# Patient Record
Sex: Male | Born: 1945 | Race: White | Hispanic: No | Marital: Married | Smoking: Never smoker
Health system: Southern US, Community
[De-identification: ages and names within clinical notes are randomized; demographics above are authoritative.]

## PROBLEM LIST (undated history)

## (undated) DIAGNOSIS — M4802 Spinal stenosis, cervical region: Secondary | ICD-10-CM

## (undated) DIAGNOSIS — N401 Enlarged prostate with lower urinary tract symptoms: Secondary | ICD-10-CM

## (undated) DIAGNOSIS — M25811 Other specified joint disorders, right shoulder: Secondary | ICD-10-CM

## (undated) DIAGNOSIS — E785 Hyperlipidemia, unspecified: Secondary | ICD-10-CM

## (undated) DIAGNOSIS — E291 Testicular hypofunction: Secondary | ICD-10-CM

## (undated) DIAGNOSIS — M797 Fibromyalgia: Secondary | ICD-10-CM

## (undated) DIAGNOSIS — M1990 Unspecified osteoarthritis, unspecified site: Secondary | ICD-10-CM

## (undated) DIAGNOSIS — Z87898 Personal history of other specified conditions: Secondary | ICD-10-CM

## (undated) DIAGNOSIS — N320 Bladder-neck obstruction: Secondary | ICD-10-CM

## (undated) DIAGNOSIS — F419 Anxiety disorder, unspecified: Secondary | ICD-10-CM

## (undated) DIAGNOSIS — C4490 Unspecified malignant neoplasm of skin, unspecified: Secondary | ICD-10-CM

## (undated) DIAGNOSIS — F4310 Post-traumatic stress disorder, unspecified: Secondary | ICD-10-CM

## (undated) DIAGNOSIS — N529 Male erectile dysfunction, unspecified: Secondary | ICD-10-CM

## (undated) HISTORY — PX: TONSILLECTOMY: SUR1361

## (undated) HISTORY — DX: Unspecified osteoarthritis, unspecified site: M19.90

## (undated) HISTORY — DX: Hyperlipidemia, unspecified: E78.5

## (undated) HISTORY — DX: Fibromyalgia: M79.7

## (undated) HISTORY — PX: RHINOPLASTY: SUR1284

## (undated) HISTORY — DX: Unspecified malignant neoplasm of skin, unspecified: C44.90

---

## 1997-07-01 ENCOUNTER — Ambulatory Visit (HOSPITAL_COMMUNITY): Admission: RE | Admit: 1997-07-01 | Discharge: 1997-07-01 | Payer: Self-pay | Admitting: Family Medicine

## 2004-03-04 ENCOUNTER — Encounter: Admission: RE | Admit: 2004-03-04 | Discharge: 2004-03-04 | Payer: Self-pay | Admitting: Family Medicine

## 2004-06-30 ENCOUNTER — Ambulatory Visit: Payer: Self-pay | Admitting: Gastroenterology

## 2004-07-05 ENCOUNTER — Ambulatory Visit: Payer: Self-pay | Admitting: Gastroenterology

## 2004-07-06 ENCOUNTER — Ambulatory Visit: Payer: Self-pay | Admitting: Gastroenterology

## 2004-07-08 ENCOUNTER — Ambulatory Visit: Payer: Self-pay | Admitting: Gastroenterology

## 2004-07-08 ENCOUNTER — Encounter (INDEPENDENT_AMBULATORY_CARE_PROVIDER_SITE_OTHER): Payer: Self-pay | Admitting: Specialist

## 2005-06-02 ENCOUNTER — Ambulatory Visit: Payer: Self-pay | Admitting: Cardiology

## 2005-07-11 ENCOUNTER — Ambulatory Visit: Payer: Self-pay | Admitting: Gastroenterology

## 2005-07-28 ENCOUNTER — Ambulatory Visit: Payer: Self-pay | Admitting: Gastroenterology

## 2005-08-30 ENCOUNTER — Encounter (INDEPENDENT_AMBULATORY_CARE_PROVIDER_SITE_OTHER): Payer: Self-pay | Admitting: *Deleted

## 2005-08-30 ENCOUNTER — Ambulatory Visit: Payer: Self-pay | Admitting: Gastroenterology

## 2008-07-30 DIAGNOSIS — F4310 Post-traumatic stress disorder, unspecified: Secondary | ICD-10-CM | POA: Insufficient documentation

## 2008-10-05 ENCOUNTER — Encounter: Admission: RE | Admit: 2008-10-05 | Discharge: 2008-10-05 | Payer: Self-pay | Admitting: Family Medicine

## 2008-10-14 ENCOUNTER — Telehealth: Payer: Self-pay | Admitting: Family Medicine

## 2008-11-09 ENCOUNTER — Ambulatory Visit: Payer: Self-pay | Admitting: Family Medicine

## 2008-11-09 LAB — CONVERTED CEMR LAB
Ketones, urine, test strip: NEGATIVE
Nitrite: NEGATIVE
Urobilinogen, UA: 0.2
WBC Urine, dipstick: NEGATIVE
pH: 5.5

## 2009-04-20 ENCOUNTER — Encounter (INDEPENDENT_AMBULATORY_CARE_PROVIDER_SITE_OTHER): Payer: Self-pay | Admitting: *Deleted

## 2009-10-18 ENCOUNTER — Ambulatory Visit: Payer: Self-pay | Admitting: Family Medicine

## 2009-10-18 LAB — CONVERTED CEMR LAB
Bilirubin Urine: NEGATIVE
Blood in Urine, dipstick: NEGATIVE
Nitrite: NEGATIVE
Specific Gravity, Urine: 1.025
WBC Urine, dipstick: NEGATIVE

## 2009-10-19 LAB — CONVERTED CEMR LAB
Basophils Absolute: 0 10*3/uL (ref 0.0–0.1)
Bilirubin, Direct: 0.1 mg/dL (ref 0.0–0.3)
Cholesterol: 140 mg/dL (ref 0–200)
Direct LDL: 79.6 mg/dL
Eosinophils Absolute: 0.2 10*3/uL (ref 0.0–0.7)
HCT: 47.2 % (ref 39.0–52.0)
HDL: 31.1 mg/dL — ABNORMAL LOW (ref >39.00)
Lymphs Abs: 1.3 10*3/uL (ref 0.7–4.0)
MCV: 87.7 fL (ref 78.0–100.0)
Monocytes Absolute: 0.6 10*3/uL (ref 0.1–1.0)
Neutrophils Relative %: 74.1 % (ref 43.0–77.0)
Platelets: 175 10*3/uL (ref 150.0–400.0)
RDW: 14.6 % (ref 11.5–14.6)
Total Bilirubin: 0.9 mg/dL (ref 0.3–1.2)
Total CHOL/HDL Ratio: 5
VLDL: 58.8 mg/dL — ABNORMAL HIGH (ref 0.0–40.0)

## 2009-12-30 ENCOUNTER — Encounter (INDEPENDENT_AMBULATORY_CARE_PROVIDER_SITE_OTHER): Payer: Self-pay | Admitting: *Deleted

## 2010-01-11 ENCOUNTER — Encounter (INDEPENDENT_AMBULATORY_CARE_PROVIDER_SITE_OTHER): Payer: Self-pay | Admitting: *Deleted

## 2010-01-12 ENCOUNTER — Ambulatory Visit: Payer: Self-pay | Admitting: Gastroenterology

## 2010-01-17 ENCOUNTER — Telehealth: Payer: Self-pay | Admitting: Gastroenterology

## 2010-02-08 ENCOUNTER — Telehealth: Payer: Self-pay | Admitting: Gastroenterology

## 2010-02-11 ENCOUNTER — Ambulatory Visit: Payer: Self-pay | Admitting: Gastroenterology

## 2010-03-08 ENCOUNTER — Telehealth: Payer: Self-pay | Admitting: Gastroenterology

## 2010-03-09 ENCOUNTER — Ambulatory Visit
Admission: RE | Admit: 2010-03-09 | Discharge: 2010-03-09 | Payer: Self-pay | Source: Home / Self Care | Attending: Gastroenterology | Admitting: Gastroenterology

## 2010-03-09 DIAGNOSIS — K5730 Diverticulosis of large intestine without perforation or abscess without bleeding: Secondary | ICD-10-CM | POA: Insufficient documentation

## 2010-03-12 ENCOUNTER — Encounter: Payer: Self-pay | Admitting: Family Medicine

## 2010-03-14 ENCOUNTER — Encounter: Payer: Self-pay | Admitting: Family Medicine

## 2010-03-22 NOTE — Assessment & Plan Note (Signed)
Summary: NEW PT TO EST/CLE   Vital Signs:  Patient profile:   65 year old male Height:      69 inches Weight:      195.6 pounds BMI:     28.99 Temp:     98.2 degrees F oral Pulse rate:   76 / minute Pulse rhythm:   regular BP sitting:   130 / 90  (left arm) Cuff size:   regular  Vitals Entered By: Benny Lennert CMA Duncan Dull) (October 18, 2009 1:49 PM)  History of Present Illness: 65 yo here to establish care. Pt of the [facility] clinic.  1. Low back pain- started about a month ago.  Midback, radiates to his thighs bilaterally, right> left.  Never radiates past his knees.  No LE weakness.  Does have increased urinary urgency which has been ongoing for sometime.  No nausea, vomiting, fevers or chills.  Helps take care of his grandchildren.  2.  Right scrotal masses- has h/o scrotal varices but noticed a new firm mass on top of his right testicle a few months ago.  Not getting bigger.  No redness.  It is a little sensitive but not painful.  3.  Fibromyalgia- on Paxil 20 mg dail for years.  Tried to get off of it but it "made him crazy."  Pain moves from his hips to shoulders, has been ok lately.  4.  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  Has not had repeat labs.  Libido is a little improved, less fatigue.  Current Medications (verified): 1)  Zocor 40 Mg Tabs (Simvastatin) .... Take 1 Tab By Mouth At Bedtime 2)  Paxil 20 Mg Tabs (Paroxetine Hcl) .... Take 1 Tab By Mouth Once Daily 3)  Testim 1 % Gel (Testosterone) .... 2.5% Daily 4)  Centrum Silver  Chew (Multiple Vitamins-Minerals) .... One Daily  Allergies (verified): No Known Drug Allergies  Past History:  Past Medical History: Last updated: 11/09/2008 fibromyalgia skin cancer hx white coat HTN  adenosine myoview, 2008 normal  Past Surgical History: Last updated: 11/09/2008 skin cancer removal, Dr Tanya Nones  Family History: Last updated: 11/09/2008 identical twin brother AMI at 72, 23,  high chol father died at 22, AMI Mother died at 75, AMI brother/ sister healthy  Social History: Last updated: 11/09/2008 Retired from [employer].  Retired from [employer], 4 yrs. Has BA degree. Married to [place].  No kids, stepson. Never smoked. No ETOH. Walks 45 min 5 days/ wk.    Review of Systems      See HPI General:  Denies fever. Eyes:  Denies blurring. ENT:  Denies difficulty swallowing. CV:  Denies chest pain or discomfort. Resp:  Denies shortness of breath. GI:  Denies abdominal pain, bloody stools, and change in bowel habits. GU:  Complains of decreased libido and urinary frequency; denies discharge, dysuria, erectile dysfunction, genital sores, hematuria, incontinence, nocturia, and urinary hesitancy. MS:  Complains of low back pain and stiffness; denies loss of strength and muscle weakness. Derm:  Denies rash. Neuro:  Denies headaches. Psych:  Denies anxiety and depression. Endo:  Denies cold intolerance and heat intolerance. Heme:  Denies abnormal bruising and bleeding.  Physical Exam  General:  alert, well-developed, well-nourished, and well-hydrated.   Head:  normocephalic, atraumatic, no abnormalities observed, and no abnormalities palpated.   Eyes:  vision grossly intact, pupils equal, pupils round, and pupils reactive to light.   Ears:  R ear normal and L ear normal.   Nose:  no external deformity.   Mouth:  good dentition.   Lungs:  Normal respiratory effort, chest expands symmetrically. Lungs are clear to auscultation, no crackles or wheezes. Heart:  Normal rate and regular rhythm. S1 and S2 normal without gallop, murmur, click, rub or other extra sounds. Abdomen:  Bowel sounds positive,abdomen soft and non-tender without masses, organomegaly or hernias noted. Genitalia:  bilateral scrotal varices, palpable mass above right testicle, nontender to palp.circumcised and no urethral discharge.   Msk:  No deformity or scoliosis noted of thoracic or lumbar spine.     SLR pos right, neg fabers bilaterally Extremities:  No clubbing, cyanosis, edema, or deformity noted with normal full range of motion of all joints.   Neurologic:  alert & oriented X3 and gait normal.   Skin:  Intact without suspicious lesions or rashes Psych:  Cognition and judgment appear intact. Alert and cooperative with normal attention span and concentration. No apparent delusions, illusions, hallucinations   Impression & Recommendations:  Problem # 1:  TESTICULAR HYPOFUNCTION (ICD-257.2) Assessment Unchanged Has not had labs redrawn since he started Testim. Will recheck testosterone, CBC, hepatic panel today. Orders: TLB-Testosterone, Total (84403-TESTO) TLB-CBC Platelet - w/Differential (85025-CBCD) TLB-Hepatic/Liver Function Pnl (80076-HEPATIC)  Problem # 2:  TESTICULAR MASS, RIGHT (ZOX-096.04) Assessment: New Scrotal ultrasound- [facility].  Problem # 3:  FIBROMYALGIA (ICD-729.1) Assessment: Unchanged Stable on Paxil 20 mg daily.  Problem # 4:  BACK PAIN, ACUTE (ICD-724.5) Assessment: New Likely lumbar strain but given other symptoms, will go ahead and check spine films. Pt declining muscle relaxants at this time, which is appropriate. UA neg. Orders: UA Dipstick w/o Micro (manual) (54098) T-Lumbar Spine Complete, 5 Views (71110TC)  Complete Medication List: 1)  Zocor 40 Mg Tabs (Simvastatin) .... Take 1 tab by mouth at bedtime 2)  Paxil 20 Mg Tabs (Paroxetine hcl) .... Take 1 tab by mouth once daily 3)  Testim 1 % Gel (Testosterone) .... 2.5% daily 4)  Centrum Silver Chew (Multiple vitamins-minerals) .... One daily  Other Orders: TLB-Lipid Panel (80061-LIPID) Venipuncture (11914) TLB-PSA (Prostate Specific Antigen) (84153-PSA)  Current Allergies (reviewed today): No known allergies   CC: Establish care   Prevention & Chronic Care Immunizations   Influenza vaccine: Not documented    Tetanus booster:  09/29/2003: historical   Td booster deferral: Not indicated  (10/18/2009)   Tetanus booster due: 09/28/2013    Pneumococcal vaccine: Not documented    H. zoster vaccine: Not documented  Colorectal Screening   Hemoccult: Not documented    Colonoscopy: historical  (10/13/2003)   Colonoscopy due: Refused  (10/18/2009)  Other Screening   PSA: Not documented   PSA ordered.   PSA action/deferral: Discussed-PSA requested  (10/18/2009)   Smoking status: Not documented  Lipids   Total Cholesterol: Not documented   Lipid panel action/deferral: Lipid Panel ordered   LDL: Not documented   LDL Direct: Not documented   HDL: Not documented   Triglycerides: Not documented   Nursing Instructions: Give Herpes zoster vaccine today    TD Result Date:  09/29/2003 TD Result:  historical Colonoscopy Result Date:  10/13/2003 Colonoscopy Result:  historical Colonoscopy Next Due:  Refused  Laboratory Results   Urine Tests  Date/Time Received: October 18, 2009 2:26 PM  Date/Time Reported: October 18, 2009 2:26 PM   Routine Urinalysis   Color: yellow Appearance: Clear Glucose: negative   (Normal Range: Negative) Bilirubin: negative   (Normal Range: Negative) Ketone: negative   (  Normal Range: Negative) Spec. Gravity: 1.025   (Normal Range: 1.003-1.035) Blood: negative   (Normal Range: Negative) pH: 6.0   (Normal Range: 5.0-8.0) Protein: trace   (Normal Range: Negative) Urobilinogen: 0.2   (Normal Range: 0-1) Nitrite: negative   (Normal Range: Negative) Leukocyte Esterace: negative   (Normal Range: Negative)         Appended Document: NEW PT TO EST/CLE   Zostavax # 1    Vaccine Type: Zostavax    Site: left deltoid    Mfr: Merck    Dose: 0.5 ml    Route: McGraw    Given by: Sydell Axon LPN    Exp. Date: 09/15/2010    Lot #: [id number]    VIS given: 12/02/04 given October 18, 2009.

## 2010-03-22 NOTE — Letter (Signed)
Summary: Pre Visit Letter Revised  [facility]  [address] [place], [place] [address]   Phone: [phone]  Fax: [phone]      12/30/2009 MRN: [id number]     [facility] [place] [address] [place], [place]  [address]             Procedure Date:  01/25/10 @ 4 P.M.   Welcome to the Gastroenterology Division at [facility].    You are scheduled to see a nurse for your pre-procedure visit on [facility], 01/12/10 at 11:00 A.M. on the 3rd floor at [facility], [address]  We ask that you try to arrive at our office 15 minutes prior to your appointment time to allow for check-in.  Please take a minute to review the attached form.  If you answer "Yes" to one or more of the questions on the first page, we ask that you call the person listed at your earliest opportunity.  If you answer "No" to all of the questions, please complete the rest of the form and bring it to your appointment.    Your nurse visit will consist of discussing your medical and surgical history, your immediate family medical history, and your medications.   If you are unable to list all of your medications on the form, please bring the medication bottles to your appointment and we will list them.  We will need to be aware of both prescribed and over the counter drugs.  We will need to know exact dosage information as well.    Please be prepared to read and sign documents such as consent forms, a financial agreement, and acknowledgement forms.  If necessary, and with your consent, a friend or relative is welcome to sit-in on the nurse visit with you.  Please bring your insurance card so that we may make a copy of it.  If your insurance requires a referral to see a specialist, please bring your referral form from your primary care physician.  No co-pay is required for this nurse visit.     If you cannot keep your appointment, please call [phone] to cancel or reschedule prior to your  appointment date.  This allows [place] the opportunity to schedule an appointment for another patient in need of care.    Thank you for choosing [facility] for your medical needs.  We appreciate the opportunity to care for you.  Please visit [place] at our website  to learn more about our practice.  Sincerely, The Gastroenterology Division

## 2010-03-22 NOTE — Letter (Signed)
Summary: [facility] Instructions  [facility]  [address] [place], [place] [address]   Phone: [phone]  Fax: [phone]       Eric Rice    09-05-1945    MRN: [id number]        Procedure Day /Date:  Tuesday 01/25/2010     Arrival Time: 3:00 pm      Procedure Time: 4:00 pm     Location of Procedure:                    _x _  [facility] (4th Floor)                        PREPARATION FOR COLONOSCOPY WITH MOVIPREP   Starting 5 days prior to your procedure Thursday 12/1 do not eat nuts, seeds, popcorn, corn, beans, peas,  salads, or any raw vegetables.  Do not take any fiber supplements (e.g. Metamucil, Citrucel, and Benefiber).  THE DAY BEFORE YOUR PROCEDURE         DATE: Monday 12/5  1.  Drink clear liquids the entire day-NO SOLID FOOD  2.  Do not drink anything colored red or purple.  Avoid juices with pulp.  No orange juice.  3.  Drink at least 64 oz. (8 glasses) of fluid/clear liquids during the day to prevent dehydration and help the prep work efficiently.  CLEAR LIQUIDS INCLUDE: Water Jello Ice Popsicles Tea (sugar ok, no milk/cream) Powdered fruit flavored drinks Coffee (sugar ok, no milk/cream) Gatorade Juice: apple, white grape, white cranberry  Lemonade Clear bullion, consomm, broth Carbonated beverages (any kind) Strained chicken noodle soup Hard Candy                             4.  In the morning, mix first dose of MoviPrep solution:    Empty 1 Pouch A and 1 Pouch B into the disposable container    Add lukewarm drinking water to the top line of the container. Mix to dissolve    Refrigerate (mixed solution should be used within 24 hrs)  5.  Begin drinking the prep at 5:00 p.m. The MoviPrep container is divided by 4 marks.   Every 15 minutes drink the solution down to the next mark (approximately 8 oz) until the full liter is complete.   6.  Follow completed prep with 16 oz of clear liquid of your choice (Nothing red  or purple).  Continue to drink clear liquids until bedtime.  7.  Before going to bed, mix second dose of MoviPrep solution:    Empty 1 Pouch A and 1 Pouch B into the disposable container    Add lukewarm drinking water to the top line of the container. Mix to dissolve    Refrigerate  THE DAY OF YOUR PROCEDURE      DATE: Tuesday 12/6  Beginning at 11:00 a.m. (5 hours before procedure):         1. Every 15 minutes, drink the solution down to the next mark (approx 8 oz) until the full liter is complete.  2. Follow completed prep with 16 oz. of clear liquid of your choice.    3. You may drink clear liquids until 2:00 pm (2 HOURS BEFORE PROCEDURE).   MEDICATION INSTRUCTIONS  Unless otherwise instructed, you should take regular prescription medications with a small sip of water   as early as possible the morning of  your procedure.         OTHER INSTRUCTIONS  You will need a responsible adult at least 65 years of age to accompany you and drive you home.   This person must remain in the waiting room during your procedure.  Wear loose fitting clothing that is easily removed.  Leave jewelry and other valuables at home.  However, you may wish to bring a book to read or  an iPod/MP3 player to listen to music as you wait for your procedure to start.  Remove all body piercing jewelry and leave at home.  Total time from sign-in until discharge is approximately 2-3 hours.  You should go home directly after your procedure and rest.  You can resume normal activities the  day after your procedure.  The day of your procedure you should not:   Drive   Make legal decisions   Operate machinery   Drink alcohol   Return to work  You will receive specific instructions about eating, activities and medications before you leave.    The above instructions have been reviewed and explained to me by   Clide Cliff, RN______________________    I fully understand and can verbalize  these instructions _____________________________ Date _________

## 2010-03-22 NOTE — Miscellaneous (Signed)
Summary: R COLON...LSW.  Clinical Lists Changes  Medications: Added new medication of MOVIPREP 100 GM  SOLR (PEG-KCL-NACL-NASULF-NA ASC-C) As per prep instructions. - Signed Rx of MOVIPREP 100 GM  SOLR (PEG-KCL-NACL-NASULF-NA ASC-C) As per prep instructions.;  #1 x 0;  Signed;  Entered by: Clide Cliff RN;  Authorized by: Meryl Dare MD [facility];  Method used: Print then Give to Patient Observations: Added new observation of ALLERGY REV: Done (01/12/2010 10:57)    Prescriptions: MOVIPREP 100 GM  SOLR (PEG-KCL-NACL-NASULF-NA ASC-C) As per prep instructions.  #1 x 0   Entered by:   Clide Cliff RN   Authorized by:   Meryl Dare MD [facility]   Signed by:   Clide Cliff RN on 01/12/2010   Method used:   Print then Give to Patient   RxID:   [id number]

## 2010-03-22 NOTE — Progress Notes (Signed)
Summary: Question   Phone Note Call from Patient Call back at Home Phone [phone]   Caller: Patient Call For: Dr. Russella Dar Reason for Call: Talk to Nurse Details for Reason: Question Summary of Call: Pt. has colon scheduled 12/6 at 4 p.m.  He also has to have an emergenc root canal 12/7, where he will also be "put under." He was concerned that he shouldn't be "put out" two days in a row. Please call and advise. Initial call taken by: Schuyler Amor,  January 17, 2010 3:36 PM  Follow-up for Phone Call        Patient  is advsied that since his colon is first he will need to speak with his dentist if this will be a problem. Follow-up by: Darcey Nora RN, CGRN,  January 17, 2010 3:56 PM

## 2010-03-24 NOTE — Assessment & Plan Note (Signed)
Summary: hemorrhoids/sheri   History of Present Illness Visit Type: Follow-up Visit Primary GI MD: Elie Goody MD [facility] Primary Provider: Abbe Amsterdam, MD  Requesting Provider: Abbe Amsterdam, MD  Chief Complaint: Hemorrhoids  History of Present Illness:   Mr. Eric Rice was year less than a month ago for his surveillance colonoscopy which he gets for history of polyps. He comes in today to discuss bowel movement issues. For the last 7-8 months patient has had a sensation of incomplete evacuation. His stools are very soft. In the morning he has a BM but within an hour or so he feels the need, and usually does, defecate again. It is also difficult to clean himself after defecating because the BMs are so soft. Excessive wiping results in excoriations. Patient has known history of hemorrhoids, they occasionally bleed    GI Review of Systems      Denies abdominal pain, acid reflux, belching, bloating, chest pain, dysphagia with liquids, dysphagia with solids, heartburn, loss of appetite, nausea, vomiting, vomiting blood, weight loss, and  weight gain.      Reports hemorrhoids.     Denies anal fissure, black tarry stools, change in bowel habit, constipation, diarrhea, diverticulosis, fecal incontinence, heme positive stool, irritable bowel syndrome, jaundice, light color stool, liver problems, rectal bleeding, and  rectal pain.   Current Medications (verified): 1)  Zocor 40 Mg Tabs (Simvastatin) .... Take 1 Tab By Mouth At Bedtime 2)  Paxil 20 Mg Tabs (Paroxetine Hcl) .... Take 1 Tab By Mouth Once Daily 3)  Testim 1 % Gel (Testosterone) .... 2.5% Daily 4)  Centrum Silver  Chew (Multiple Vitamins-Minerals) .... One Daily 5)  Preparation H Hydrocortisone 1 % Crea (Hydrocortisone) .... 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 Retired from [employer].  Retired from [employer], 4 yrs. Has BA degree. Married to [place].  No kids, stepson. Never smoked. No ETOH. Walks 45 min 5 days/ wk.    Review of Systems  The patient denies allergy/sinus, anemia, anxiety-new, arthritis/joint pain, back pain, blood in urine, breast changes/lumps, change in vision, confusion, cough, coughing up blood, depression-new, fainting, fatigue, fever, headaches-new, hearing problems, heart murmur, heart rhythm changes, itching, menstrual pain, muscle pains/cramps, night sweats, nosebleeds, pregnancy symptoms, shortness of breath, skin rash, sleeping problems, sore throat, swelling of feet/legs, swollen lymph glands, thirst - excessive , urination - excessive , urination changes/pain, urine leakage, vision changes, and voice change.    Vital Signs:  Patient profile:   65 year old male Height:      69 inches Weight:      194 pounds BMI:     28.75 BSA:     2.04 Pulse rate:   74 / minute Pulse rhythm:   regular BP sitting:   136 / 80  (left arm) Cuff size:   regular  Vitals Entered By: Ok Anis CMA (March 09, 2010 2:05 PM)  Physical Exam  General:  Well  developed, well nourished, no acute distress. Head:  Normocephalic and atraumatic. Eyes:  Conjunctiva pink, no icterus.  Abdomen:  Soft, nontender and nondistended. No masses, hepatosplenomegaly or hernias noted. Normal  bowel sounds. Rectal:  Mildly inflamed external hemorrhoids. On anoscopy there were inflamed hemorrhoids, some slightly firm and bluish but not thrombosed appearing. Neurologic:  Alert and  oriented x4;  grossly normal neurologically. Psych:  Alert and cooperative. Normal mood and affect.  Impression & Recommendations:  Problem # 1:  CHANGE IN BOWELS (HYQ-657.84) Assessment New Over last 7-8 months there has been sensation of incomplete evacuation leading to subsequent bowel movements. Stools are consistently soft and difficult to clean from anal area. He is frustrated about these problems. Also inquires if this is anything serious to worry about. Patient had a colonoscopy less than a month ago. I offered reassurance. Patient doesn't have abdominal pain but the mushy stools and sensation of incomplete evacuation can be seen in IBS. Additionally, hemorrhoids can sometimes causes sensation of incomplete evacuation. Will start by treating hemorrhoids. If no improvement we may try firming up his stools a bit. Patient will call with condition update in a week or so.  Problem # 2:  HEMORRHOIDS-INTERNAL (ICD-455.0) Assessment: Deteriorated Trial of steroid suppositories. Avoid straining. Recommended Balneol or baby wipes for cleaning.  Patient Instructions: 1)  Call us in 7 days with a progress report. 2)  You can ask for Pam at [phone].  3)  We sent a perscription for suppositories to [facility]. 4)  Copy sent to :  Jessice Copland, MD 5)  The medication list was reviewed and reconciled.  All changed / newly prescribed medications were explained.  A complete medication list was provided to the patient / caregiver.  Prescriptions: HYDROCORTISONE ACETATE 25 MG SUPP (HYDROCORTISONE ACETATE) Use 1 suppository at bedtime x 10 days  #10 x 1   Entered by:   Lowry Ram NCMA   Authorized by:   Willette Cluster NP   Signed by:   Lowry Ram NCMA on 03/09/2010   Method used:   [facility]ronically to         [facility]  [phone]* (retail)       [address] [place], [place]  [address]       Ph: [phone] or [phone]       Fax: [phone]   RxID:   [id number]

## 2010-03-24 NOTE — Progress Notes (Signed)
Summary: Prep Question   Phone Note Call from Patient Call back at Home Phone [phone]   Caller: Patient Call For: Dr Russella Dar Reason for Call: Talk to Nurse Details for Reason: Prep Summary of Call: Pt has prep questions; Will be home until noon. Initial call taken by: Dwan Bolt,  February 08, 2010 9:23 AM  Follow-up for Phone Call        Spoke with pt and reviewed new prep instructions for new date and time of procedure and answered all his questions. He will call back if he has further questions.Ulis Rias RN  February 08, 2010 9:58 AM

## 2010-03-24 NOTE — Progress Notes (Signed)
Summary: Triage   Phone Note Call from Patient Call back at Home Phone [phone]   Caller: Patient Call For: Dr. Russella Dar Reason for Call: Talk to Nurse Summary of Call: Having alot of problems w/his hemorroids feels like he cannot wait until his appt on 04-12-10 Initial call taken by: Karna Christmas,  March 08, 2010 3:51 PM  Follow-up for Phone Call        I spoke with the patient and he is having rectal pain from hemorrhoids.  OTC products are not working.  He is scheduled to see Willette Cluster RNP tomorrow, he does not feel he can wait to see Dr Russella Dar on 03/22/10.  He is advised to soak in a tub of warm water two times a day today and in the am and we will see him in the afternoon.   Follow-up by: Darcey Nora RN, CGRN,  March 08, 2010 4:41 PM

## 2010-03-24 NOTE — Procedures (Signed)
Summary: Colonoscopy  Patient: Kohei Antonellis Note: All result statuses are Final unless otherwise noted.  Tests: (1) Colonoscopy (COL)   COL Colonoscopy           DONE     [facility]     [address]     [place], [place]  [address]           COLONOSCOPY PROCEDURE REPORT     PATIENT:  Eric Rice, Eric Rice  MR#:  [id number]     BIRTHDATE:  1945-06-24, 64 yrs. old  GENDER:  male     ENDOSCOPIST:  Judie Petit T. Russella Dar, MD, [facility]           PROCEDURE DATE:  02/11/2010     PROCEDURE:  Higher-risk screening colonoscopy G0105     ASA CLASS:  Class II     INDICATIONS:  1) surveillance and high-risk screening  2) history     of adenomatous colon polyps: 08/2005.     MEDICATIONS:   Fentanyl 100 mcg IV, Versed 10 mg IV     DESCRIPTION OF PROCEDURE:   After the risks benefits and     alternatives of the procedure were thoroughly explained, informed     consent was obtained.  Digital rectal exam was performed and     revealed no abnormalities.   The LB CF-[id number] endoscope     was introduced through the anus and advanced to the cecum, which     was identified by both the appendix and ileocecal valve, without     limitations.  The quality of the prep was excellent, using     MoviPrep.  The instrument was then slowly withdrawn as the colon     was fully examined.     <<PROCEDUREIMAGES>>     FINDINGS:  Mild diverticulosis was found in the sigmoid colon. A     normal appearing cecum, ileocecal valve, and appendiceal orifice     were identified. The ascending, hepatic flexure, transverse,     splenic flexure, descending colon, and rectum appeared     unremarkable. Retroflexed views in the rectum revealed internal     hemorrhoids, small. The time to cecum =  3  minutes. The scope was     then withdrawn (time =  8.25  min) from the patient and the     procedure completed.     COMPLICATIONS:  None           ENDOSCOPIC IMPRESSION:     1) Mild diverticulosis in the sigmoid colon     2) Internal  hemorrhoids     RECOMMENDATIONS:     1) High fiber diet with liberal fluid intake.     2) Repeat Colonoscopy in 5 years.           Venita Lick. Russella Dar, MD, [facility]           n.     eSIGNED:   Venita Lick. Stark at 02/11/2010 11:44 AM           Radman, Fayrene Fearing, [id number]  Note: An exclamation mark (!) indicates a result that was not dispersed into the flowsheet. Document Creation Date: 02/11/2010 11:45 AM _______________________________________________________________________  (1) Order result status: Final Collection or observation date-time: 02/11/2010 11:39 Requested date-time:  Receipt date-time:  Reported date-time:  Referring Physician:   Ordering Physician: Claudette Head [phone]) Specimen Source:  Source: Launa Grill Order Number: [id number] Lab site:   Appended Document: Colonoscopy    Clinical Lists Changes  Observations:  Added new observation of COLONNXTDUE: 02/12/2015 (02/11/2010 11:57)

## 2010-03-24 NOTE — Letter (Signed)
Summary: [facility] Referral (unable to schedule) Notification   Gastroenterology  [address] [place], [place] [address]   Phone: [phone]  Fax: [phone]      April 20, 2009 Eric Rice 07-27-1945 MRN: [id number]   [facility] [address] [place], [place]  [address]   Dear Dr. Milus Glazier:   Thank you for your kind referral of the above patient. We have attempted to schedule the recommended COLONOSCOPY but have been unable to schedule because:  _X_ The patient was not available by phone and/or has not returned our calls.  __ The patient declined to schedule the procedure at this time.  We appreciate the referral and hope that we will have the opportunity to treat this patient in the future.    Sincerely,   [facility]  Vania Rea. Jarold Motto M.D. Hedwig Morton. Juanda Chance M.D. Venita Lick. Russella Dar M.D. Wilhemina Bonito. Marina Goodell M.D. Barbette Hair. Arlyce Dice M.D. Iva Boop M.D. Cheron Every.D.

## 2010-04-12 ENCOUNTER — Ambulatory Visit: Payer: Self-pay | Admitting: Gastroenterology

## 2010-06-21 ENCOUNTER — Other Ambulatory Visit: Payer: Self-pay | Admitting: *Deleted

## 2010-06-21 DIAGNOSIS — E7800 Pure hypercholesterolemia, unspecified: Secondary | ICD-10-CM

## 2010-07-13 ENCOUNTER — Ambulatory Visit: Payer: Self-pay | Admitting: Cardiology

## 2010-07-13 ENCOUNTER — Other Ambulatory Visit: Payer: Self-pay | Admitting: *Deleted

## 2010-09-08 ENCOUNTER — Telehealth: Payer: Self-pay | Admitting: *Deleted

## 2010-09-08 NOTE — Telephone Encounter (Signed)
Agreed.  Thank you.  Please change PCP in epic to me, not Dr. Clent Ridges. Thanks.

## 2010-09-08 NOTE — Telephone Encounter (Signed)
Wife says that patient has had diarrhea since Monday. He has no nausea or diarrhea, no fever. Is drinking lots of Gatorade. I advised very bland diet, continue drinking plenty of fluid, suck on popsickles, avoid dairy. Call back if not better in a couple of days.

## 2010-11-28 ENCOUNTER — Encounter: Payer: Self-pay | Admitting: Family Medicine

## 2010-11-28 ENCOUNTER — Ambulatory Visit (INDEPENDENT_AMBULATORY_CARE_PROVIDER_SITE_OTHER): Payer: Medicare Other | Admitting: Family Medicine

## 2010-11-28 VITALS — BP 160/110 | HR 87 | Temp 99.2°F | Ht 69.0 in | Wt 200.5 lb

## 2010-11-28 DIAGNOSIS — Z79899 Other long term (current) drug therapy: Secondary | ICD-10-CM | POA: Insufficient documentation

## 2010-11-28 DIAGNOSIS — N5089 Other specified disorders of the male genital organs: Secondary | ICD-10-CM | POA: Insufficient documentation

## 2010-11-28 DIAGNOSIS — IMO0001 Reserved for inherently not codable concepts without codable children: Secondary | ICD-10-CM

## 2010-11-28 DIAGNOSIS — Z0000 Encounter for general adult medical examination without abnormal findings: Secondary | ICD-10-CM

## 2010-11-28 DIAGNOSIS — Z125 Encounter for screening for malignant neoplasm of prostate: Secondary | ICD-10-CM

## 2010-11-28 DIAGNOSIS — R109 Unspecified abdominal pain: Secondary | ICD-10-CM

## 2010-11-28 DIAGNOSIS — N400 Enlarged prostate without lower urinary tract symptoms: Secondary | ICD-10-CM

## 2010-11-28 DIAGNOSIS — Z23 Encounter for immunization: Secondary | ICD-10-CM

## 2010-11-28 DIAGNOSIS — E291 Testicular hypofunction: Secondary | ICD-10-CM

## 2010-11-28 DIAGNOSIS — F411 Generalized anxiety disorder: Secondary | ICD-10-CM

## 2010-11-28 DIAGNOSIS — R102 Pelvic and perineal pain: Secondary | ICD-10-CM | POA: Insufficient documentation

## 2010-11-28 DIAGNOSIS — I10 Essential (primary) hypertension: Secondary | ICD-10-CM

## 2010-11-28 DIAGNOSIS — R030 Elevated blood-pressure reading, without diagnosis of hypertension: Secondary | ICD-10-CM

## 2010-11-28 DIAGNOSIS — E785 Hyperlipidemia, unspecified: Secondary | ICD-10-CM

## 2010-11-28 DIAGNOSIS — N508 Other specified disorders of male genital organs: Secondary | ICD-10-CM

## 2010-11-28 LAB — CBC WITH DIFFERENTIAL/PLATELET
Basophils Relative: 0.4 % (ref 0.0–3.0)
Eosinophils Relative: 2.4 % (ref 0.0–5.0)
HCT: 48.3 % (ref 39.0–52.0)
Hemoglobin: 16.1 g/dL (ref 13.0–17.0)
Lymphs Abs: 1.1 10*3/uL (ref 0.7–4.0)
Monocytes Relative: 7.6 % (ref 3.0–12.0)
Neutro Abs: 5.8 10*3/uL (ref 1.4–7.7)
Platelets: 173 10*3/uL (ref 150.0–400.0)
RBC: 5.47 Mil/uL (ref 4.22–5.81)
WBC: 7.7 10*3/uL (ref 4.5–10.5)

## 2010-11-28 LAB — BASIC METABOLIC PANEL
CO2: 26 mEq/L (ref 19–32)
Chloride: 104 mEq/L (ref 96–112)
Creatinine, Ser: 1.2 mg/dL (ref 0.4–1.5)
Glucose, Bld: 118 mg/dL — ABNORMAL HIGH (ref 70–99)

## 2010-11-28 LAB — PSA: PSA: 0.76 ng/mL (ref 0.10–4.00)

## 2010-11-28 NOTE — Progress Notes (Signed)
Subjective:    Patient ID: Eric Rice, male    DOB: 1945-09-21, 65 y.o.   MRN: [id number]  HPI  I have personally reviewed the Medicare Annual Wellness questionnaire and have noted 1. The patient's medical and social history 2. Their use of alcohol, tobacco or illicit drugs 3. Their current medications and supplements 4. The patient's functional ability including ADL's, fall risks, home safety risks and hearing or visual             impairment. 5. Diet and physical activities 6. Evidence for depression or mood disorders  HLD- on Zocor 40 mg daily. Labs checked 6 months ago at [place], we do not have these records. Mr. Creps was told they were normal.  Testosterone deficiency- On testosterone but has not had PSA or CBC checked as far as he knows.  Testicular masses- he felt them in his right testicular last year, went to urologist at [facility] as was told nothing to worry about.  Per pt, scrotal U/S normal. Now feels mass in left testicle as well and having suprapubic pain when he lifts something heavy or goes up stairs.  No fevers, chills, nausea or vomiting. Pain getting progressively worse. No difficulty starting or stopping urinary stream.  Patient Active Problem List  Diagnoses  . TESTICULAR HYPOFUNCTION  . SCROTAL VARICES  . TESTICULAR MASS, RIGHT  . BACK PAIN, ACUTE  . FIBROMYALGIA  . HYPERTENSION, WHITE COAT  . NEOPLASM, MALIGNANT, SKIN, HX OF  . HYPERLIPIDEMIA  . ANXIETY  . HYPERTENSION  . CORONARY ARTERY DISEASE  . GERD  . OSTEOARTHRITIS  . GANGLION CYST  . HEMORRHOIDS-INTERNAL  . HEMORRHOIDS  . DIVERTICULAR DISEASE  . CHANGE IN BOWELS  . Routine general medical examination at a health care facility   Past Medical History  Diagnosis Date  . Skin cancer   . Fibromyalgia   . Chest pain     neg adenosine myoview in 2008   No past surgical history on file. History  Substance Use Topics  . Smoking status: Never Smoker   . Smokeless tobacco: Not on file  . Alcohol  Use: Not on file   Family History  Problem Relation Age of Onset  . Heart disease Brother 74    MI   Current outpatient prescriptions:Multiple Vitamins-Minerals (CENTRUM SILVER PO), Take by mouth.  , Disp: , Rfl: ;  PARoxetine (PAXIL) 20 MG tablet, Take 20 mg by mouth every morning.  , Disp: , Rfl: ;  simvastatin (ZOCOR) 40 MG tablet, Take 40 mg by mouth at bedtime.  , Disp: , Rfl: ;  testosterone (ANDROGEL) 50 MG/5GM GEL, Place 5 g onto the skin daily.  , Disp: , Rfl:   The PMH, PSH, Social History, Family History, Medications, and allergies have been reviewed in [facility], and have been updated if relevant.   Review of Systems See HPI Patient reports no  vision/ hearing changes,anorexia, weight change, fever ,adenopathy, persistant / recurrent hoarseness, swallowing issues, chest pain, edema,persistant / recurrent cough, hemoptysis, dyspnea(rest, exertional, paroxysmal nocturnal), gastrointestinal  bleeding (melena, rectal bleeding),  excessive heart burn, GU symptoms(dysuria, hematuria, pyuria, voiding/incontinence  Issues) syncope, focal weakness, severe memory loss, concerning skin lesions, depression, anxiety, abnormal bruising/bleeding, major joint swelling.       Objective:   Physical Exam BP 160/110  Pulse 87  Temp(Src) 99.2 F (37.3 C) (Oral)  Ht 5\' 9"  (1.753 m)  Wt 200 lb 8 oz (90.946 kg)  BMI 29.61 kg/m2  General:  pleasant male in  NAD Eyes:  PERRL Ears:  External ear exam shows no significant lesions or deformities.  Otoscopic examination reveals clear canals, tympanic membranes are intact bilaterally without bulging, retraction, inflammation or discharge. Hearing is grossly normal bilaterally. Nose:  External nasal examination shows no deformity or inflammation. Nasal mucosa are pink and moist without lesions or exudates. Mouth:  Oral mucosa and oropharynx without lesions or exudates.  Teeth in good repair. Neck:  no carotid bruit or thyromegaly no cervical or supraclavicular  lymphadenopathy  Lungs:  Normal respiratory effort, chest expands symmetrically. Lungs are clear to auscultation, no crackles or wheezes. Heart:  Normal rate and regular rhythm. S1 and S2 normal without gallop, murmur, click, rub or other extra sounds. Abdomen:  Bowel sounds positive,abdomen soft and non-tender without masses, organomegaly or hernias noted. Genitalia:  Testes bilaterally descended with bilateral nodularity, tenderness or masses. No scrotal masses or lesions. No penis lesions or urethral discharge. Prostate:  Prostate gland firm and smooth, 1 plus enlargement, no nodularity, tenderness, mass, asymmetry or induration. Pulses:  R and L posterior tibial pulses are full and equal bilaterally  Extremities:  no edema      Assessment & Plan:   1. Routine general medical examination at a health care facility  The patients weight, height, BMI and visual acuity have been recorded in the chart I have made referrals, counseling and provided education to the patient based review of the above and I have provided the pt with a written personalized care plan for preventive services.   Basic Metabolic Panel (BMET)  2. HYPERTENSION, WHITE COAT   Unchanged.  Asymptomatic.     3. FIBROMYALGIA   Stable.    4. ANXIETY   Stable on Paxil.   5. Testicular mass   With new palpable masses, will order CT of abd/pelvis to evaluated theses masses further and to rule out any other urological or pelvic process since now having suprapubic pain. CT Abdomen Pelvis W Wo Contrast  6. Suprapubic pain   See above. CT Abdomen Pelvis W Wo Contrast  7. TESTICULAR HYPOFUNCTION  Awaiting labs from [place] but CBC and PSA need to be checked today. CBC w/Diff  8. Enlarged prostate  PSA

## 2010-11-28 NOTE — Patient Instructions (Signed)
Good to see you. Please try to get your records (including last lab report) from [place]. Please stop by to see Shirlee Limerick on your way out to set up your CT scan.

## 2010-12-05 ENCOUNTER — Telehealth: Payer: Self-pay | Admitting: *Deleted

## 2010-12-05 NOTE — Telephone Encounter (Signed)
Pt. Is calling for lab results and to find out if he can start Androgel.

## 2010-12-05 NOTE — Telephone Encounter (Signed)
Spoke with pt and gave results. 

## 2010-12-09 ENCOUNTER — Telehealth: Payer: Self-pay | Admitting: Family Medicine

## 2010-12-09 NOTE — Telephone Encounter (Signed)
Noted  

## 2010-12-09 NOTE — Telephone Encounter (Signed)
Patient finally called me back to say that he called the [place] and they  told him he had a CT back in March2012 as well as a testicular US. York Spaniel it was nothing to worry about , thought it was a cyst. Patient wants you to cancel the CT order that you put in for him at this time. He will talk to his primary care Dr at the [place] soon.

## 2011-03-20 DIAGNOSIS — L820 Inflamed seborrheic keratosis: Secondary | ICD-10-CM | POA: Diagnosis not present

## 2011-03-20 DIAGNOSIS — L570 Actinic keratosis: Secondary | ICD-10-CM | POA: Diagnosis not present

## 2011-03-20 DIAGNOSIS — Z85828 Personal history of other malignant neoplasm of skin: Secondary | ICD-10-CM | POA: Diagnosis not present

## 2011-06-29 ENCOUNTER — Other Ambulatory Visit: Payer: Self-pay | Admitting: Family Medicine

## 2011-06-29 DIAGNOSIS — N51 Disorders of male genital organs in diseases classified elsewhere: Secondary | ICD-10-CM | POA: Diagnosis not present

## 2011-06-29 DIAGNOSIS — R109 Unspecified abdominal pain: Secondary | ICD-10-CM | POA: Diagnosis not present

## 2011-07-03 ENCOUNTER — Other Ambulatory Visit: Payer: Medicare Other

## 2011-07-11 DIAGNOSIS — N400 Enlarged prostate without lower urinary tract symptoms: Secondary | ICD-10-CM | POA: Diagnosis not present

## 2011-07-11 DIAGNOSIS — R109 Unspecified abdominal pain: Secondary | ICD-10-CM | POA: Diagnosis not present

## 2011-07-11 DIAGNOSIS — N419 Inflammatory disease of prostate, unspecified: Secondary | ICD-10-CM | POA: Diagnosis not present

## 2011-08-23 DIAGNOSIS — N508 Other specified disorders of male genital organs: Secondary | ICD-10-CM | POA: Diagnosis not present

## 2011-08-23 DIAGNOSIS — N453 Epididymo-orchitis: Secondary | ICD-10-CM | POA: Diagnosis not present

## 2011-08-23 DIAGNOSIS — R109 Unspecified abdominal pain: Secondary | ICD-10-CM | POA: Diagnosis not present

## 2011-08-23 DIAGNOSIS — K649 Unspecified hemorrhoids: Secondary | ICD-10-CM | POA: Diagnosis not present

## 2011-08-23 DIAGNOSIS — Z87448 Personal history of other diseases of urinary system: Secondary | ICD-10-CM | POA: Diagnosis not present

## 2011-08-23 DIAGNOSIS — Z1211 Encounter for screening for malignant neoplasm of colon: Secondary | ICD-10-CM | POA: Diagnosis not present

## 2011-09-28 ENCOUNTER — Telehealth: Payer: Self-pay

## 2011-11-06 DIAGNOSIS — I781 Nevus, non-neoplastic: Secondary | ICD-10-CM | POA: Diagnosis not present

## 2011-11-06 DIAGNOSIS — D235 Other benign neoplasm of skin of trunk: Secondary | ICD-10-CM | POA: Diagnosis not present

## 2011-11-06 DIAGNOSIS — D2370 Other benign neoplasm of skin of unspecified lower limb, including hip: Secondary | ICD-10-CM | POA: Diagnosis not present

## 2011-11-06 DIAGNOSIS — L821 Other seborrheic keratosis: Secondary | ICD-10-CM | POA: Diagnosis not present

## 2011-11-09 ENCOUNTER — Ambulatory Visit: Payer: Medicare Other | Admitting: Family Medicine

## 2011-11-10 ENCOUNTER — Ambulatory Visit: Payer: Medicare Other | Admitting: Family Medicine

## 2011-11-21 DIAGNOSIS — Z86711 Personal history of pulmonary embolism: Secondary | ICD-10-CM

## 2011-11-21 HISTORY — DX: Personal history of pulmonary embolism: Z86.711

## 2011-11-27 ENCOUNTER — Encounter: Payer: Self-pay | Admitting: Family Medicine

## 2011-11-27 ENCOUNTER — Ambulatory Visit (INDEPENDENT_AMBULATORY_CARE_PROVIDER_SITE_OTHER): Payer: Medicare Other | Admitting: Family Medicine

## 2011-11-27 VITALS — BP 138/94 | HR 96 | Temp 98.1°F | Wt 200.0 lb

## 2011-11-27 DIAGNOSIS — Z125 Encounter for screening for malignant neoplasm of prostate: Secondary | ICD-10-CM | POA: Diagnosis not present

## 2011-11-27 DIAGNOSIS — R0609 Other forms of dyspnea: Secondary | ICD-10-CM

## 2011-11-27 DIAGNOSIS — R0989 Other specified symptoms and signs involving the circulatory and respiratory systems: Secondary | ICD-10-CM

## 2011-11-27 DIAGNOSIS — M5412 Radiculopathy, cervical region: Secondary | ICD-10-CM | POA: Diagnosis not present

## 2011-11-27 DIAGNOSIS — R5381 Other malaise: Secondary | ICD-10-CM | POA: Diagnosis not present

## 2011-11-27 DIAGNOSIS — M5410 Radiculopathy, site unspecified: Secondary | ICD-10-CM

## 2011-11-27 LAB — CBC WITH DIFFERENTIAL/PLATELET
Basophils Relative: 0.5 % (ref 0.0–3.0)
Eosinophils Absolute: 0.2 10*3/uL (ref 0.0–0.7)
MCHC: 33.4 g/dL (ref 30.0–36.0)
MCV: 87.7 fl (ref 78.0–100.0)
Monocytes Absolute: 0.5 10*3/uL (ref 0.1–1.0)
Neutro Abs: 4.8 10*3/uL (ref 1.4–7.7)
Neutrophils Relative %: 70.9 % (ref 43.0–77.0)
RBC: 5.39 Mil/uL (ref 4.22–5.81)
RDW: 13.8 % (ref 11.5–14.6)

## 2011-11-27 LAB — TSH: TSH: 2.68 u[IU]/mL (ref 0.35–5.50)

## 2011-11-27 NOTE — Patient Instructions (Addendum)
Good to see you. We will call you with your lab results. Please come back for your xrays at your convenience.

## 2011-11-27 NOTE — Progress Notes (Signed)
Subjective:    Patient ID: Eric Rice, male    DOB: [birth year], 66 y.o.   MRN: [id number]  HPI  66 yo very pleasant male who receives much of his care at the [place] here for several issues.  1.  DOE- notices for past 3-4 months increase SOB when he walks. Takes care of his 3 grandchildren and used to walk three miles a day without difficulty. He does have a h/o fibromyalgia so he assumed his fatigue was associated with this.  No CP. He is a non smoker. Strong FH of CAD- twin brother and father had MIs in 52s. Per pt, neg nuclear stress test a few years ago.  He does use testosterone for low T but per pt, labs have all been ok.  2.  Right arm/finger numbness- he is right handed.  Has noticed it when he grabs or turns something with his right hand.  Sometimes pain can wake him up at night. No difficulty with grip strength. No injury.  3.  Knees tend to hurt when he walks up and down stairs.  4.  Still having the right sided groin pain- has had two CTs and urology follow up- no abnormality other than benign testicular cysts discovered.  Patient Active Problem List  Diagnosis  . DIVERTICULAR DISEASE  . Routine general medical examination at a health care facility  . Testicular mass  . Suprapubic pain  . Long term use of drug  . DOE (dyspnea on exertion)  . Radiculopathy of arm  . Malaise   Past Medical History  Diagnosis Date  . Skin cancer   . Fibromyalgia   . Chest pain     neg adenosine myoview in 2008   No past surgical history on file. History  Substance Use Topics  . Smoking status: Never Smoker   . Smokeless tobacco: Not on file  . Alcohol Use: Not on file   Family History  Problem Relation Age of Onset  . Heart disease Brother 45    MI   No Known Allergies Current Outpatient Prescriptions on File Prior to Visit  Medication Sig Dispense Refill  . aspirin 81 MG tablet Take 81 mg by mouth daily.        . calcium carbonate (OS-CAL) 600 MG TABS Take 600 mg by  mouth daily.        . cholecalciferol (VITAMIN D) 1000 UNITS tablet Take 1,000 Units by mouth daily.        . Multiple Vitamins-Minerals (CENTRUM SILVER PO) Take by mouth.        Marland Kitchen PARoxetine (PAXIL) 20 MG tablet Take 20 mg by mouth every morning.        . simvastatin (ZOCOR) 40 MG tablet Take 40 mg by mouth at bedtime.        Marland Kitchen testosterone (ANDROGEL) 50 MG/5GM GEL Place 5 g onto the skin daily.         The PMH, PSH, Social History, Family History, Medications, and allergies have been reviewed in [facility], and have been updated if relevant.    Review of Systems See HPI No fevers. Appetite good-weight stable Wt Readings from Last 3 Encounters:  11/27/11 200 lb (90.719 kg)  11/28/10 200 lb 8 oz (90.946 kg)  03/09/10 194 lb (87.998 kg)   No blood in stool- colonoscopy last year.    Objective:   Physical Exam BP 138/94  Pulse 96  Temp 98.1 F (36.7 C)  Wt 200 lb (90.719 kg)  General:  overweght male in NAD Eyes:  PERRL Ears:  External ear exam shows no significant lesions or deformities.  Otoscopic examination reveals clear canals, tympanic membranes are intact bilaterally without bulging, retraction, inflammation or discharge. Hearing is grossly normal bilaterally. Nose:  External nasal examination shows no deformity or inflammation. Nasal mucosa are pink and moist without lesions or exudates. Mouth:  Oral mucosa and oropharynx without lesions or exudates.  Teeth in good repair. Neck:  no carotid bruit or thyromegaly no cervical or supraclavicular lymphadenopathy  Lungs:  Normal respiratory effort, chest expands symmetrically. Lungs are clear to auscultation, no crackles or wheezes. Heart:  Normal rate and regular rhythm. S1 and S2 normal without gallop, murmur, click, rub or other extra sounds. Abdomen:  Bowel sounds positive,abdomen soft and non-tender without masses, organomegaly or hernias noted. Pulses:  R and L posterior tibial pulses are full and equal bilaterally    Extremities:  no edema      Assessment & Plan:   1. DOE (dyspnea on exertion)  EKG reassuring- NSR. Hopefully due to deconditioning but I would like to check labs and CXR today as well. The patient indicates understanding of these issues and agrees with the plan.  CBC with Differential, D-dimer, quantitative, DG Chest 2 View, EKG 12-Lead  2. Radiculopathy of arm  Normal exam- ?arthritis in cervical spine. No red flag symptoms. DG Cervical Spine Complete  3. Malaise  Could be from fibromylagia but we do need to work up other possible factors. Pt gets much of his care from [place] so he would like an rx to take to his PCP at [facility] for further labs and studies. Will start with labs here and follow up to see if we need additional studies. The patient indicates understanding of these issues and agrees with the plan.  TSH  4. Screening for prostate cancer  PSA, Medicare

## 2011-11-28 ENCOUNTER — Encounter (HOSPITAL_COMMUNITY): Payer: Self-pay | Admitting: Emergency Medicine

## 2011-11-28 ENCOUNTER — Other Ambulatory Visit: Payer: Self-pay | Admitting: Family Medicine

## 2011-11-28 ENCOUNTER — Ambulatory Visit (INDEPENDENT_AMBULATORY_CARE_PROVIDER_SITE_OTHER)
Admission: RE | Admit: 2011-11-28 | Discharge: 2011-11-28 | Disposition: A | Discharge status: Home / Self Care | Payer: Medicare Other | Source: Ambulatory Visit | Attending: Family Medicine | Admitting: Family Medicine

## 2011-11-28 ENCOUNTER — Emergency Department (HOSPITAL_COMMUNITY)
Admission: EM | Admit: 2011-11-28 | Discharge: 2011-11-28 | Disposition: A | Discharge status: Home / Self Care | Payer: Medicare Other | Attending: Emergency Medicine | Admitting: Emergency Medicine

## 2011-11-28 DIAGNOSIS — Z79899 Other long term (current) drug therapy: Secondary | ICD-10-CM | POA: Insufficient documentation

## 2011-11-28 DIAGNOSIS — R791 Abnormal coagulation profile: Secondary | ICD-10-CM | POA: Diagnosis not present

## 2011-11-28 DIAGNOSIS — R0602 Shortness of breath: Secondary | ICD-10-CM | POA: Insufficient documentation

## 2011-11-28 DIAGNOSIS — Z7982 Long term (current) use of aspirin: Secondary | ICD-10-CM | POA: Diagnosis not present

## 2011-11-28 DIAGNOSIS — M25519 Pain in unspecified shoulder: Secondary | ICD-10-CM | POA: Insufficient documentation

## 2011-11-28 DIAGNOSIS — R7989 Other specified abnormal findings of blood chemistry: Secondary | ICD-10-CM

## 2011-11-28 DIAGNOSIS — I2699 Other pulmonary embolism without acute cor pulmonale: Secondary | ICD-10-CM

## 2011-11-28 LAB — PROTIME-INR: INR: 1 (ref 0.00–1.49)

## 2011-11-28 MED ORDER — RIVAROXABAN 15 MG PO TABS
15.0000 mg | ORAL_TABLET | Freq: Once | ORAL | Status: AC
  Administered 2011-11-28: 15 mg via ORAL
  Filled 2011-11-28: qty 1

## 2011-11-28 MED ORDER — RIVAROXABAN 15 MG PO TABS: 15.0000 mg | ORAL_TABLET | Freq: Two times a day (BID) | ORAL | Status: DC

## 2011-11-28 MED ORDER — IOHEXOL 350 MG/ML SOLN
80.0000 mL | Freq: Once | INTRAVENOUS | Status: AC | PRN
  Administered 2011-11-28: 80 mL via INTRAVENOUS

## 2011-11-28 NOTE — Progress Notes (Signed)
*  Preliminary Results* Bilateral lower extremity venous duplex completed. Bilateral lower extremities are negative for deep vein thrombosis. There is evidence of a left Baker's cyst with internal debris measuring 3 x 0.4cm.  11/28/2011 6:46 PM Gertie Fey, RDMS, RDCS

## 2011-11-28 NOTE — ED Notes (Addendum)
Pt went to pcp for SOB x 2-4 months. Ct angioperformed. Results in chart. 3 small PE noted. Pt sent to [facility] for treatment. Pt only have SOB on exertion.

## 2011-11-28 NOTE — ED Notes (Signed)
[id number]<AV> Expected date:<BR> Expected time:<BR> Means of arrival:<BR> Comments:<BR> Hold for triage PE pt

## 2011-11-28 NOTE — ED Provider Notes (Signed)
History     CSN: [id number]  Arrival date & time 11/28/11  1341   First MD Initiated Contact with Patient 11/28/11 1513      Chief Complaint  Patient presents with  . 3 Pulmonary emobli     (Consider location/radiation/quality/duration/timing/severity/associated sxs/prior treatment) HPI Comments: Eric Rice presents ambulatory with his wife for evaluation.  He was seen by his PMD yesterday secondary to feeling short-of-breath.  He states it has been occurring over the last several weeks but became more noticeable recently.  He denies fever, leg pain or swelling, cough, palpitations, and chest pain.  He was found to have an elevated ddimer and sent for a CTA this morning. The CT demonstrates several small pulmonary emboli.  Currently he states he feels fine.  The history is provided by the patient. No language interpreter was used.    Past Medical History  Diagnosis Date  . Skin cancer   . Fibromyalgia   . Chest pain     neg adenosine myoview in 2008    History reviewed. No pertinent past surgical history.  Family History  Problem Relation Age of Onset  . Heart disease Brother 60    MI    History  Substance Use Topics  . Smoking status: Never Smoker   . Smokeless tobacco: Not on file  . Alcohol Use: Not on file      Review of Systems  Constitutional: Positive for fatigue. Negative for fever, chills, activity change and appetite change.  HENT: Negative.   Eyes: Negative.   Respiratory: Positive for shortness of breath. Negative for cough, chest tightness and wheezing.   Cardiovascular: Negative for chest pain, palpitations and leg swelling.  Gastrointestinal: Negative.   Genitourinary: Negative for dysuria, hematuria, flank pain, scrotal swelling, difficulty urinating and testicular pain.       Reports chronic pelvic pain.  Has had previous scans, scrotal ultrasounds, and cystoscopy without identifying a cause.  Musculoskeletal: Positive for myalgias, back pain and  arthralgias.  Neurological: Negative for syncope, weakness and light-headedness.  Hematological: Negative for adenopathy. Does not bruise/bleed easily.  Psychiatric/Behavioral: Negative.   All other systems reviewed and are negative.    Allergies  Review of patient's allergies indicates no known allergies.  Home Medications   Current Outpatient Rx  Name Route Sig Dispense Refill  . ASPIRIN 81 MG PO TABS Oral Take 81 mg by mouth at bedtime.     Marland Kitchen CALCIUM CARBONATE 600 MG PO TABS Oral Take 600 mg by mouth every morning.     Marland Kitchen VITAMIN D 1000 UNITS PO TABS Oral Take 1,000 Units by mouth every morning.     . CENTRUM SILVER PO Oral Take by mouth.      Marland Kitchen PAROXETINE HCL 20 MG PO TABS Oral Take 20 mg by mouth every morning.      Marland Kitchen SIMVASTATIN 40 MG PO TABS Oral Take 40 mg by mouth at bedtime.      . TESTOSTERONE 50 MG/5GM TD GEL Transdermal Place 5 g onto the skin daily.        BP 137/81  Pulse 90  Temp 98.2 F (36.8 C) (Oral)  Ht 5\' 9"  (1.753 m)  Wt 200 lb (90.719 kg)  BMI 29.53 kg/m2  SpO2 97%  Physical Exam  Nursing note and vitals reviewed. Constitutional: He is oriented to person, place, and time. He appears well-developed and well-nourished. No distress.  HENT:  Head: Normocephalic and atraumatic.  Right Ear: External ear normal.  Left Ear: External  ear normal.  Nose: Nose normal.  Mouth/Throat: Oropharynx is clear and moist. No oropharyngeal exudate.  Eyes: Conjunctivae normal are normal. Pupils are equal, round, and reactive to light. Right eye exhibits no discharge. Left eye exhibits no discharge. No scleral icterus.  Neck: Normal range of motion. Neck supple. No JVD present. No tracheal deviation present. No thyromegaly present.  Cardiovascular: Normal rate, regular rhythm, normal heart sounds and intact distal pulses.  Exam reveals no gallop and no friction rub.   No murmur heard. Pulmonary/Chest: Effort normal and breath sounds normal. No stridor. No respiratory  distress. He has no wheezes. He has no rales. He exhibits no tenderness.  Abdominal: Soft. Bowel sounds are normal. He exhibits no distension and no mass. There is no tenderness. There is no rebound and no guarding.  Musculoskeletal: Normal range of motion. He exhibits no edema and no tenderness.  Lymphadenopathy:    He has no cervical adenopathy.  Neurological: He is alert and oriented to person, place, and time.  Skin: Skin is warm and dry. No rash noted. He is not diaphoretic. No erythema. No pallor.  Psychiatric: He has a normal mood and affect.    ED Course  Procedures (including critical care time)   Labs Reviewed  PROTIME-INR   Ct Angio Chest W/cm &/or Wo Cm  11/28/2011  *RADIOLOGY REPORT*  Clinical Data: Elevated D-dimer, right shoulder pain, shortness of breath, recent long car travel  CT ANGIOGRAPHY CHEST  Technique:  Multidetector CT imaging of the chest using the standard protocol during bolus administration of intravenous contrast. Multiplanar reconstructed images including MIPs were obtained and reviewed to evaluate the vascular anatomy.  Contrast: 80mL OMNIPAQUE IOHEXOL 350 MG/ML SOLN  Comparison: None  Findings: Aorta normal caliber with scattered atherosclerotic calcifications noted at aorta and coronary arteries. No aortic aneurysm or dissection identified. Visualized portion of upper abdomen unremarkable. Small filling defects are identified within bilateral lower lobe and right upper lobe pulmonary arteries compatible with small pulmonary emboli. No large or central pulmonary emboli identified. 2-3 mm diameter right lung nodules images 27, 37, and 58. Minimal dependent atelectasis at posterior lower lobes. No infiltrate, pleural effusion or pneumothorax. Bones appear diffusely demineralized.  IMPRESSION: Small pulmonary emboli identified within bilateral lower and right upper lobes. Nonspecific 2-3 mm diameter right lung nodules, recommendation below.  If the patient is at high  risk for bronchogenic carcinoma, follow- up chest CT at 1 year is recommended.  If the patient is at low risk, no follow-up is needed.  This recommendation follows the consensus statement: Guidelines for Management of Small Pulmonary Nodules Detected on CT Scans:  A Statement from the Fleischner Society as published in Radiology 2005; 237:395-400.  Critical Value/emergent results were called by telephone at the time of interpretation on 11/28/2011 at 1155 hrs to [facility], who verbally acknowledged these results.   Original Report Authenticated By: Lollie Marrow, M.D.      No diagnosis found.   Date: 11/28/2011  Rate: 90 bpm  Rhythm: sinus  QRS Axis: normal  Intervals: normal  ST/T Wave abnormalities: normal  Conduction Disutrbances:none  Narrative Interpretation:   Old EKG Reviewed: none available  Sinus rhythm, no evidence of ischemia or strain.    MDM  Pt presents at the request of his PMD for evaluation.  He has had dyspnea for several weeks prompting an outpt eval.  He had an elevated ddimer and several small PEs on CT scan.  I have reviewed the labs and imaging performed  prior to his arrival in the ER.  Will place on telemetry, obtain an PT/INR and ECG.  He has risk factors for PE that include a car trip 2 weeks ago to [place] and the use of androgel secondary to chronic fatigue.  1550.  Discussed his evaluation with his primary physician.  She will see him in office later this week.  Will start him on xarelto today.  If no large lower extremity DVTs seen on U/S or hemodynamic instability observed, plan discharge home.  1820.  Preliminary interpretation of lower extremity venous dopplers is negative for DVT.  Pt to receive first dose of xarelto here prior to discharge home.  Discussed indications for immediate return to the emergency department prior to pt discharge.  He demonstrates clear understanding.      Tobin Chad, MD 11/28/11 [phone]

## 2011-11-30 ENCOUNTER — Ambulatory Visit (INDEPENDENT_AMBULATORY_CARE_PROVIDER_SITE_OTHER): Payer: Medicare Other | Admitting: Family Medicine

## 2011-11-30 ENCOUNTER — Other Ambulatory Visit: Payer: Medicare Other

## 2011-11-30 ENCOUNTER — Encounter: Payer: Self-pay | Admitting: Family Medicine

## 2011-11-30 VITALS — BP 124/80 | HR 80 | Temp 98.3°F | Wt 199.0 lb

## 2011-11-30 DIAGNOSIS — I2699 Other pulmonary embolism without acute cor pulmonale: Secondary | ICD-10-CM

## 2011-11-30 DIAGNOSIS — R1031 Right lower quadrant pain: Secondary | ICD-10-CM | POA: Diagnosis not present

## 2011-11-30 DIAGNOSIS — R0602 Shortness of breath: Secondary | ICD-10-CM | POA: Diagnosis not present

## 2011-11-30 MED ORDER — RIVAROXABAN 20 MG PO TABS: 20.0000 mg | ORAL_TABLET | Freq: Every day | ORAL | Status: DC

## 2011-11-30 NOTE — Addendum Note (Signed)
Addended by: Dianne Dun on: 11/30/2011 10:59 AM   Modules accepted: Orders

## 2011-11-30 NOTE — Progress Notes (Signed)
Subjective:    Patient ID: Eric Rice, male    DOB: 1945/06/10, 66 y.o.   MRN: [id number]  HPI  66 yo very pleasant male who receives much of his care at the [facility] here for ER follow up.  Earlier this week, I saw him for multiple complaints, most concerning was DOE for past 3-4 months.     He had recently returned from a cross country road trip.   He was being prescribed androgel through the [facility], which has since been stopped.  Given his risk factors, I checked a D- dimer which was elevated  Subsequent CT angio was positive for three small PEs and right pulmonary nodules (he has never smoked).  Once I received call report of pos PEs, I advised him to go to ER.  Notes reviewed- seen at [facility] on 10/8, Bilateral venous dopplers of LE neg for DVTs.  EKG unchanged and sent home on Xarelto 15 mg twice daily . Advised to stop his testosterone  Ct Angio Chest W/cm &/or Wo Cm  11/28/2011  *RADIOLOGY REPORT*  Clinical Data: Elevated D-dimer, right shoulder pain, shortness of breath, recent long car travel  CT ANGIOGRAPHY CHEST  Technique:  Multidetector CT imaging of the chest using the standard protocol during bolus administration of intravenous contrast. Multiplanar reconstructed images including MIPs were obtained and reviewed to evaluate the vascular anatomy.  Contrast: 80mL OMNIPAQUE IOHEXOL 350 MG/ML SOLN  Comparison: None  Findings: Aorta normal caliber with scattered atherosclerotic calcifications noted at aorta and coronary arteries. No aortic aneurysm or dissection identified. Visualized portion of upper abdomen unremarkable. Small filling defects are identified within bilateral lower lobe and right upper lobe pulmonary arteries compatible with small pulmonary emboli. No large or central pulmonary emboli identified. 2-3 mm diameter right lung nodules images 27, 37, and 58. Minimal dependent atelectasis at posterior lower lobes. No infiltrate, pleural effusion or pneumothorax. Bones appear  diffusely demineralized.  IMPRESSION: Small pulmonary emboli identified within bilateral lower and right upper lobes. Nonspecific 2-3 mm diameter right lung nodules, recommendation below.  If the patient is at high risk for bronchogenic carcinoma, follow- up chest CT at 1 year is recommended.  If the patient is at low risk, no follow-up is needed.  This recommendation follows the consensus statement: Guidelines for Management of Small Pulmonary Nodules Detected on CT Scans:  A Statement from the Fleischner Society as published in Radiology 2005; 237:395-400.  Critical Value/emergent results were called by telephone at the time of interpretation on 11/28/2011 at 1155 hrs to [facility], who verbally acknowledged these results.   Original Report Authenticated By: Lollie Marrow, M.D.    He has a strong FH of CAD- brother and father had MIs in 62s.  Right groin pain- he continues to complain of this worsening right groin pain.  I tried to order a CT of his pelvis last year but he canceled it to have it done at the [facility]. It was not done and his pain has continued. He does have testicular cysts seen on testicular U/S that appear benign.  Patient Active Problem List  Diagnosis  . DIVERTICULAR DISEASE  . Routine general medical examination at a health care facility  . Testicular mass  . Suprapubic pain  . Long term use of drug  . DOE (dyspnea on exertion)  . Radiculopathy of arm  . Malaise  . Acute pulmonary embolism   Past Medical History  Diagnosis Date  . Skin cancer   . Fibromyalgia   .  Chest pain     neg adenosine myoview in 2008   No past surgical history on file. History  Substance Use Topics  . Smoking status: Never Smoker   . Smokeless tobacco: Not on file  . Alcohol Use: Not on file   Family History  Problem Relation Age of Onset  . Heart disease Brother 39    MI   No Known Allergies Current Outpatient Prescriptions on File Prior to Visit  Medication Sig Dispense Refill  .  aspirin 81 MG tablet Take 81 mg by mouth at bedtime.       . calcium carbonate (OS-CAL) 600 MG TABS Take 600 mg by mouth every morning.       . cholecalciferol (VITAMIN D) 1000 UNITS tablet Take 1,000 Units by mouth every morning.       . Multiple Vitamins-Minerals (CENTRUM SILVER PO) Take by mouth.        Marland Kitchen PARoxetine (PAXIL) 20 MG tablet Take 20 mg by mouth every morning.        . Rivaroxaban (XARELTO) 15 MG TABS tablet Take 1 tablet (15 mg total) by mouth 2 (two) times daily with a meal.  10 tablet  0  . simvastatin (ZOCOR) 40 MG tablet Take 40 mg by mouth at bedtime.        Marland Kitchen DISCONTD: testosterone (ANDROGEL) 50 MG/5GM GEL Place 5 g onto the skin daily.         The PMH, PSH, Social History, Family History, Medications, and allergies have been reviewed in [facility], and have been updated if relevant.    Review of Systems See HPI No CP    Objective:   Physical Exam BP 124/80  Pulse 80  Temp 98.3 F (36.8 C)  Wt 199 lb (90.266 kg)  General:  overweght male in NAD Eyes:  PERRL Ears:  External ear exam shows no significant lesions or deformities.  Otoscopic examination reveals clear canals, tympanic membranes are intact bilaterally without bulging, retraction, inflammation or discharge. Hearing is grossly normal bilaterally. Nose:  External nasal examination shows no deformity or inflammation. Nasal mucosa are pink and moist without lesions or exudates. Mouth:  Oral mucosa and oropharynx without lesions or exudates.  Teeth in good repair. Neck:  no carotid bruit or thyromegaly no cervical or supraclavicular lymphadenopathy  Lungs:  Normal respiratory effort, chest expands symmetrically. Lungs are clear to auscultation, no crackles or wheezes. Heart:  Normal rate and regular rhythm. S1 and S2 normal without gallop, murmur, click, rub or other extra sounds. Abdomen:  Bowel sounds positive,abdomen soft and non-tender without masses, organomegaly or hernias noted. Pulses:  R and L posterior  tibial pulses are full and equal bilaterally  Extremities:  no edema      Assessment & Plan:   1. Acute pulmonary embolism  With negative bilateral dopplers. Continue Xarelto 15 mg twice daily with food for 3 weeks followed by 20 mg once daily with food for at least 3 months, possibly 6 months since he had 3 PEs. Discussed with cardiology, ok to start with TTE rather than TEE to rule out cardiac source.   2D Echocardiogram without contrast  2. SOB (shortness of breath) See above. Advised pt that he will likely be SOB for next several months as those clots dissolve.  He declined diuretic at this time.  2D Echocardiogram without contrast   3. Right groin pain  Deteriorated. Given his PEs, I strongly urged him to go through with the CT scan to rule out  any type of malignancy.   PSA neg. Denies any dysuria or hematuria.  Has recently seen urology. CT Abdomen Pelvis W Contrast

## 2011-11-30 NOTE — Patient Instructions (Addendum)
Good to see you, Mr. Hlavaty. Please stop by to see Shirlee Limerick on your way out.  Continue Xarelto 15 mg twice daily with food for 3 weeks followed by 20 mg once daily with food.  We will be in touch after I get the results of your tests.  Let me know if you do want a water pill or diuretic.

## 2011-12-01 ENCOUNTER — Ambulatory Visit (INDEPENDENT_AMBULATORY_CARE_PROVIDER_SITE_OTHER)
Admission: RE | Admit: 2011-12-01 | Discharge: 2011-12-01 | Disposition: A | Discharge status: Home / Self Care | Payer: Medicare Other | Source: Ambulatory Visit | Attending: Family Medicine | Admitting: Family Medicine

## 2011-12-01 DIAGNOSIS — R1031 Right lower quadrant pain: Secondary | ICD-10-CM

## 2011-12-01 DIAGNOSIS — R599 Enlarged lymph nodes, unspecified: Secondary | ICD-10-CM | POA: Diagnosis not present

## 2011-12-01 MED ORDER — IOHEXOL 300 MG/ML  SOLN
100.0000 mL | Freq: Once | INTRAMUSCULAR | Status: AC | PRN
  Administered 2011-12-01: 100 mL via INTRAVENOUS

## 2011-12-04 ENCOUNTER — Other Ambulatory Visit: Payer: Self-pay | Admitting: Family Medicine

## 2011-12-04 DIAGNOSIS — R590 Localized enlarged lymph nodes: Secondary | ICD-10-CM

## 2011-12-05 ENCOUNTER — Telehealth: Payer: Self-pay | Admitting: Oncology

## 2011-12-05 ENCOUNTER — Other Ambulatory Visit (INDEPENDENT_AMBULATORY_CARE_PROVIDER_SITE_OTHER): Payer: Medicare Other

## 2011-12-05 ENCOUNTER — Other Ambulatory Visit: Payer: Self-pay

## 2011-12-05 DIAGNOSIS — R0602 Shortness of breath: Secondary | ICD-10-CM

## 2011-12-05 DIAGNOSIS — I2699 Other pulmonary embolism without acute cor pulmonale: Secondary | ICD-10-CM

## 2011-12-05 NOTE — Telephone Encounter (Signed)
C/D 12/05/11 for appt 12/06/11

## 2011-12-05 NOTE — Telephone Encounter (Signed)
S/W pt in re NP appt 10/16 @ 1:30 w/ Dr. Clelia Croft.  Referring Dr. Dayton Martes Dx- Mesenteric Lymphadenopathy NP packet will be given @ registration.

## 2011-12-06 ENCOUNTER — Other Ambulatory Visit: Payer: Self-pay | Admitting: Oncology

## 2011-12-06 ENCOUNTER — Ambulatory Visit: Payer: Medicare Other

## 2011-12-06 ENCOUNTER — Encounter: Payer: Self-pay | Admitting: Oncology

## 2011-12-06 ENCOUNTER — Other Ambulatory Visit (HOSPITAL_BASED_OUTPATIENT_CLINIC_OR_DEPARTMENT_OTHER): Payer: Medicare Other | Admitting: Lab

## 2011-12-06 ENCOUNTER — Ambulatory Visit (HOSPITAL_BASED_OUTPATIENT_CLINIC_OR_DEPARTMENT_OTHER): Payer: Medicare Other | Admitting: Oncology

## 2011-12-06 ENCOUNTER — Telehealth: Payer: Self-pay | Admitting: Oncology

## 2011-12-06 VITALS — BP 129/73 | HR 82 | Temp 97.1°F | Resp 20 | Ht 69.0 in | Wt 199.8 lb

## 2011-12-06 DIAGNOSIS — I2699 Other pulmonary embolism without acute cor pulmonale: Secondary | ICD-10-CM

## 2011-12-06 DIAGNOSIS — R599 Enlarged lymph nodes, unspecified: Secondary | ICD-10-CM | POA: Diagnosis not present

## 2011-12-06 DIAGNOSIS — R591 Generalized enlarged lymph nodes: Secondary | ICD-10-CM

## 2011-12-06 LAB — CBC WITH DIFFERENTIAL/PLATELET
BASO%: 0.6 % (ref 0.0–2.0)
HCT: 44 % (ref 38.4–49.9)
MCHC: 33.5 g/dL (ref 32.0–36.0)
MONO#: 0.6 10*3/uL (ref 0.1–0.9)
NEUT%: 64.3 % (ref 39.0–75.0)
WBC: 6.4 10*3/uL (ref 4.0–10.3)
lymph#: 1.4 10*3/uL (ref 0.9–3.3)

## 2011-12-06 LAB — COMPREHENSIVE METABOLIC PANEL (CC13)
ALT: 21 U/L (ref 0–55)
Albumin: 3.8 g/dL (ref 3.5–5.0)
CO2: 25 mEq/L (ref 22–29)
Calcium: 9.4 mg/dL (ref 8.4–10.4)
Chloride: 108 mEq/L — ABNORMAL HIGH (ref 98–107)
Creatinine: 1.2 mg/dL (ref 0.7–1.3)
Potassium: 4.4 mEq/L (ref 3.5–5.1)
Sodium: 140 mEq/L (ref 136–145)
Total Protein: 6.6 g/dL (ref 6.4–8.3)

## 2011-12-06 LAB — CHCC SMEAR

## 2011-12-06 NOTE — Progress Notes (Signed)
Note dictated

## 2011-12-06 NOTE — Telephone Encounter (Signed)
gv pt appt schedule for January 2014 including ct scan 03/12/12.

## 2011-12-06 NOTE — Progress Notes (Signed)
Checked in new pt with no financial concerns. °

## 2011-12-07 NOTE — Progress Notes (Signed)
CC:   [name], M.D.  REASON FOR CONSULTATION:  Lymphadenopathy.  HISTORY OF PRESENT ILLNESS:  Mr. Karner is a pleasant 66 year old gentleman of [place], lived here since the 68s, originally of [place], [place].  He is a gentleman retired from [employer], currently has VA benefits and gets part of his primary care at the [facility] in [place], [place].  He also sees Dr. Dayton Martes at [facility].  He is a gentleman with a history of fibromyalgia and hyperlipidemia and reportedly about 3 weeks ago had a trip to [place], [place], and after that he started developing symptoms of dyspnea on exertion, decreased exercise tolerance, and was sent by his primary care physician to have a CT scan of the chest to rule out pulmonary embolus.  That was done on 11/28/2011, which showed small pulmonary emboli identified in the bilateral lower right and upper lobes.  He also had a 2-3 mm in diameter right lung nodule.  The patient also had a CT scan of the abdomen and pelvis on 12/01/2011 due to growing abdominal pain and that CT scan showed a small retroperitoneal periaortic lymph node which is less than 10 mm.  There is a cluster of mildly enlarged lymph nodes in the central mesentery measuring up to 11 mm.  A 12-mm node was noted in the left abdomen.  For that reason, the patient is referred to me for evaluation for possible lymphoproliferative process.  The patient reported his abdominal pain is about the same.  He reports it has been chronic in nature.  It has been worked up in the past  He has had 2 colonoscopies as well as a GU workup that had been unrevealing.  He had not reported any fevers, had not reported any chills, had not reported any weight loss.  Able to perform most activities of daily living without any hindrance or decline.  Of note, the patient has been on Xarelto since his pulmonary embolus and he has also been on testosterone supplement and that had been discontinued  since his pulmonary embolus. Clinically, he feels relatively well at this point.  He is not reporting any chest pain, he is not reporting any shortness of breath.  He is performing most activities of daily living without any hindrance or decline.  Has not had any recent fevers, chills, or sweats.  REVIEW OF SYSTEMS:  Does not report any headaches, blurred vision, double vision.  Does not report any motor or sensory neuropathy.  Does not report any alteration in mental status.  Does not report any psychiatric issues or depression.  Does not report any fever, chills, sweats.  Does not report any cough, hemoptysis, hematemesis.  No nausea or vomiting.  No abdominal pain, hematochezia, melena, genitourinary complaints.  Rest of review of systems is unremarkable.  PAST MEDICAL HISTORY:  Significant for diverticular disease, history of hyperlipidemia, history for fibromyalgia.  Also a history of chronic pain syndrome.  MEDICATION:  He is on aspirin, calcium supplement, vitamin D, Centrum Silver.  He is on Paxil, Zocor, and he has stopped AndroGel.  SOCIAL HISTORY:  He is married, does not have any children.  Denied any alcohol or tobacco abuse.  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  FAMILY HISTORY:  Significant for coronary artery disease in his father, his mother, his brother.  PHYSICAL EXAMINATION:  General:  Alert, awake gentleman appeared in no active distress.  Vital Signs:  Blood pressure is  129/73, pulse 82, respirations 20, temperature is 97, weighs 199 pounds.  HEENT:  Head is normocephalic, atraumatic.  Pupils equal, round, and reactive to light. Oral mucosa moist and pink.  Neck:  Supple without adenopathy.  Heart: Regular rate and rhythm.  S1 and S2.  Lungs:  Clear to auscultation. Abdomen:  Soft, slightly tender to deep palpation.  Extremities:  No clubbing, cyanosis, or edema.  Neurologic:  Intact motor, sensory, and deep tendon  reflexes.  LABORATORY DATA:  Showed a hemoglobin of 14.8, white cell count 6.4, platelet count of 156.  Differential is completely normal.  Peripheral smear is completely normal.  ASSESSMENT AND PLAN:  This is a pleasant 66 year old gentleman with the following issues: 1. A borderline enlarged abdominal retroperitoneal lymph node.  The     differential diagnosis discussed today with Mr. Prater.  Causes of     this could include reactive lymphadenopathy due to his recent     illness, could have a chronic granulomatous disease, autoimmune     disorder, and possibly a lymphoproliferative disorder.  I have     discussed with him from a malignant standpoint that it is unlikely     malignant metastatic disease to the lymph nodes.  He has had     vigilant cancer screening.  He has had 2 colonoscopies in the last     10 years.  He has had multiple urological workups for his abdominal     pain.  I think it is unlikely that he has metastatic carcinoma to     the lymph node.  As I mentioned, lymphoproliferative processes such     as CLL are also extremely unlikely, he has absolutely normal white     cells, normal peripheral smear, and normal lymphocyte findings.     From a management standpoint, at this time it would be very     difficult to biopsy these lymph nodes, which ultimately would be     needed to make any diagnosis; however, a repeat imaging study in     about 3 months and assess the lymph node size.  If he has enlarging     lymph nodes, then we will pursue a biopsy.  If these lymph nodes     return back to normal size and do not appear malignant, then no     further workup will be needed and probably attributed to a reactive     process. 2. A pulmonary embolus.  Risk factors at this time include long     travel, possible hormone therapy with testosterone.  I doubt there     is an underlying malignancy causing both his lymphadenopathy and     blood clots; however, will keep that option  open at this time.  I     agree with the Xarelto treatment at this time for at least 6     months.  I will follow up in 3 months' time, will discuss his CT     scan repeat at that time.    ______________________________ Benjiman Core, M.D. FNS/MEDQ  D:  12/06/2011  T:  12/07/2011  Job:  [id number]

## 2011-12-08 ENCOUNTER — Other Ambulatory Visit: Payer: Self-pay | Admitting: Family Medicine

## 2011-12-08 MED ORDER — RIVAROXABAN 20 MG PO TABS: 20.0000 mg | ORAL_TABLET | Freq: Every day | ORAL | Status: DC

## 2011-12-22 ENCOUNTER — Telehealth: Payer: Self-pay | Admitting: Family Medicine

## 2011-12-22 NOTE — Telephone Encounter (Signed)
Okay to await return of Dr. Dayton Martes PCP.

## 2011-12-22 NOTE — Telephone Encounter (Signed)
Patient calling, he is supposed to be taking Xaretlo 20mg  daily starting on day 22.  The medication is $10/pill.  He has 15mg  tabs left over and wanting to see if he can use that they are all gone.  Has 8 tabs left.  He took 15mg  po bid up until yesterday when he was decreased to 20mg  daily.  The [facility] has declined to pay for this so it is out of pocket for him. He is trying to conserve and save money.   He call did call the manufacturer and they gave him a voucher for a 10 day supply.  They only allow one voucher per person.  Gave him the number to [employer] [phone] and [facility] [phone]. Please advise on the dose for him.

## 2011-12-25 NOTE — Telephone Encounter (Signed)
Called [employer] in [place], spoke with [place] and she will send samples of Xarelto 20mg  over here to our office. I called the pt and LMOM that we would call him when the samples get here.

## 2011-12-25 NOTE — Telephone Encounter (Signed)
It really should be 20 mg daily at this point.  Shirlee Limerick, can you see if we can get more samples from cardiology?

## 2011-12-26 NOTE — Telephone Encounter (Signed)
FYI, Cardiology sent [name] 6 bottles of Xarelto which is 30 pills. I have called and LMOM that the medicine can be picked up.

## 2011-12-26 NOTE — Telephone Encounter (Signed)
Thank you :)

## 2011-12-26 NOTE — Telephone Encounter (Signed)
Thank you so much

## 2012-01-12 ENCOUNTER — Telehealth: Payer: Self-pay

## 2012-01-12 MED ORDER — TRAMADOL HCL 50 MG PO TABS: ORAL_TABLET | ORAL | Status: DC

## 2012-01-12 NOTE — Telephone Encounter (Signed)
Pt left v/m; pt recently started Xarelto; pt having back and groin problem;had problem prior to Xarelto; not related to Xarelto(pt said Dr Dayton Martes aware of problem). Tylenol extra strength is not helping pain. Pt has been off Aleve(guideline with Xarelto)wants to know if can take Aleve again to help pain.Please advise.[facility].

## 2012-01-12 NOTE — Telephone Encounter (Signed)
Advised patient's wife, [facility]

## 2012-01-12 NOTE — Telephone Encounter (Signed)
No I would prefer he not take Alleve.  We can send in rx for Tramadol 50 mg po tid prn pain, #90 with 0 refills.

## 2012-03-04 DIAGNOSIS — IMO0001 Reserved for inherently not codable concepts without codable children: Secondary | ICD-10-CM | POA: Diagnosis not present

## 2012-03-04 DIAGNOSIS — N508 Other specified disorders of male genital organs: Secondary | ICD-10-CM | POA: Diagnosis not present

## 2012-03-04 DIAGNOSIS — Z23 Encounter for immunization: Secondary | ICD-10-CM | POA: Diagnosis not present

## 2012-03-04 DIAGNOSIS — R109 Unspecified abdominal pain: Secondary | ICD-10-CM | POA: Diagnosis not present

## 2012-03-04 DIAGNOSIS — M1990 Unspecified osteoarthritis, unspecified site: Secondary | ICD-10-CM | POA: Diagnosis not present

## 2012-03-04 DIAGNOSIS — M129 Arthropathy, unspecified: Secondary | ICD-10-CM | POA: Diagnosis not present

## 2012-03-04 DIAGNOSIS — N442 Benign cyst of testis: Secondary | ICD-10-CM | POA: Insufficient documentation

## 2012-03-06 ENCOUNTER — Encounter: Payer: Self-pay | Admitting: *Deleted

## 2012-03-06 ENCOUNTER — Telehealth: Payer: Self-pay

## 2012-03-06 NOTE — Telephone Encounter (Signed)
We do not typically do a CT to follow up on blood clots.  We have guidelines that we follow.  Most people who have initial blood clots are on anticoagulation like Xarelto for 3-6 months.  Please have him come in to see me next week so we can discuss this in detail (30 min please).

## 2012-03-06 NOTE — Telephone Encounter (Signed)
Pt called was seen 11/27/11 with SOB; dx with 3 pulmonary embolism and was sent to [facility] due to large lymph node found in lung also. Pt was started on Xarelto for 3 months; pt thought would be rechecked after 3 months and come off Xarelto. Pt has no insurance and pays $ 300/ month for Xarelto. Pt has enough med to last until next week. Pt scheduled for CT at [facility] on 03/12/12; pt spoke with CT dept at [facility] and was told not checking for blood clots but f/u on lymph node in lung. Pt said SOB seems to be going away and pt wants to know how to get test scheduled by next week to make sure blood clots are gone and pt can stop Xarelto. Pt said on his own scheduled appt with Dr Dayton Martes but not until 03/20/12.Pt request call back ASAP.Please advise.

## 2012-03-07 NOTE — Telephone Encounter (Signed)
Unable to give pt samples of xarelto, he will pick up copay card and see if that works for him.

## 2012-03-07 NOTE — Telephone Encounter (Signed)
Advised patient as instructed.  Pt was agreeable to plan.  Advised him we will try to get him samples of xarelto.

## 2012-03-07 NOTE — Telephone Encounter (Signed)
I just re reviewed hematology's note and Dr. Clelia Croft was in agreement that Eric Rice should be on Xarelto for 6 months, not 3 months.  We typically look at 3-6 months of anticoagulation for first PE but given that Eric Rice had multiple PEs, 6 months does seem more prudent.  We typically do not do a follow up CT to see if clots have resolved, we follow the guidelines stated above.  We can call cardiology again to see if they have samples.

## 2012-03-12 ENCOUNTER — Other Ambulatory Visit (HOSPITAL_BASED_OUTPATIENT_CLINIC_OR_DEPARTMENT_OTHER): Payer: Medicare Other | Admitting: Lab

## 2012-03-12 ENCOUNTER — Other Ambulatory Visit: Payer: Medicare Other

## 2012-03-12 ENCOUNTER — Ambulatory Visit (HOSPITAL_COMMUNITY)
Admission: RE | Admit: 2012-03-12 | Discharge: 2012-03-12 | Disposition: A | Discharge status: Home / Self Care | Payer: Medicare Other | Source: Ambulatory Visit | Attending: Oncology | Admitting: Oncology

## 2012-03-12 DIAGNOSIS — I700 Atherosclerosis of aorta: Secondary | ICD-10-CM | POA: Diagnosis not present

## 2012-03-12 DIAGNOSIS — R599 Enlarged lymph nodes, unspecified: Secondary | ICD-10-CM | POA: Insufficient documentation

## 2012-03-12 DIAGNOSIS — R918 Other nonspecific abnormal finding of lung field: Secondary | ICD-10-CM | POA: Insufficient documentation

## 2012-03-12 DIAGNOSIS — I2510 Atherosclerotic heart disease of native coronary artery without angina pectoris: Secondary | ICD-10-CM | POA: Insufficient documentation

## 2012-03-12 DIAGNOSIS — M519 Unspecified thoracic, thoracolumbar and lumbosacral intervertebral disc disorder: Secondary | ICD-10-CM | POA: Insufficient documentation

## 2012-03-12 DIAGNOSIS — R591 Generalized enlarged lymph nodes: Secondary | ICD-10-CM

## 2012-03-12 DIAGNOSIS — K769 Liver disease, unspecified: Secondary | ICD-10-CM | POA: Diagnosis not present

## 2012-03-12 DIAGNOSIS — N289 Disorder of kidney and ureter, unspecified: Secondary | ICD-10-CM | POA: Diagnosis not present

## 2012-03-12 LAB — COMPREHENSIVE METABOLIC PANEL (CC13)
ALT: 27 U/L (ref 0–55)
AST: 22 U/L (ref 5–34)
Albumin: 3.5 g/dL (ref 3.5–5.0)
Alkaline Phosphatase: 60 U/L (ref 40–150)
Calcium: 9.4 mg/dL (ref 8.4–10.4)
Chloride: 104 mEq/L (ref 98–107)
Creatinine: 1.2 mg/dL (ref 0.7–1.3)
Potassium: 4.4 mEq/L (ref 3.5–5.1)

## 2012-03-12 LAB — CBC WITH DIFFERENTIAL/PLATELET
BASO%: 0.8 % (ref 0.0–2.0)
EOS%: 3.5 % (ref 0.0–7.0)
MCH: 29.1 pg (ref 27.2–33.4)
MCHC: 34.2 g/dL (ref 32.0–36.0)
MONO%: 7.3 % (ref 0.0–14.0)
RBC: 4.94 10*6/uL (ref 4.20–5.82)
RDW: 13.2 % (ref 11.0–14.6)
lymph#: 1.4 10*3/uL (ref 0.9–3.3)

## 2012-03-12 MED ORDER — IOHEXOL 300 MG/ML  SOLN
100.0000 mL | Freq: Once | INTRAMUSCULAR | Status: AC | PRN
  Administered 2012-03-12: 100 mL via INTRAVENOUS

## 2012-03-14 ENCOUNTER — Ambulatory Visit (HOSPITAL_BASED_OUTPATIENT_CLINIC_OR_DEPARTMENT_OTHER): Payer: Medicare Other | Admitting: Oncology

## 2012-03-14 VITALS — BP 110/66 | HR 86 | Temp 97.4°F | Resp 20 | Ht 69.0 in | Wt 197.7 lb

## 2012-03-14 DIAGNOSIS — I2699 Other pulmonary embolism without acute cor pulmonale: Secondary | ICD-10-CM | POA: Diagnosis not present

## 2012-03-14 DIAGNOSIS — R591 Generalized enlarged lymph nodes: Secondary | ICD-10-CM

## 2012-03-14 DIAGNOSIS — R599 Enlarged lymph nodes, unspecified: Secondary | ICD-10-CM | POA: Diagnosis not present

## 2012-03-14 NOTE — Progress Notes (Signed)
Hematology and Oncology Follow Up Visit  Eric Rice [id number] 10-27-1945 67 y.o. 03/14/2012 3:40 PM [name], MDAron, Bryn Gulling, MD   Principle Diagnosis: 68 year old with enlarged abdominal and retroperitoneal lymph nodes. The differential diagnosis include reactive lymphadenopathy, chronic granulomatous disease and less likely lymphoproliferative disorder diagnosed in 11/2011.   Interim History:  Eric Rice is a pleasant 67 year old man I saw in 11/2011 for the evaluation of borderline enlarged lymph nodes. He was recently diagnosed with PE after a trip to [place] in 11/2011. He has been on Xarelto  Without complications. Since his last visit, he has reported no new complaints. No new lymphadenopathy. He is not reporting any chest pain, he is not reporting any shortness of breath. He is performing most activities of daily living without any hindrance or decline. Has not had any recent fevers, chills, or sweats.   Medications: I have reviewed the patient's current medications. Current outpatient prescriptions:acetaminophen-codeine (TYLENOL #3) 300-30 MG per tablet, Take 1 tablet by mouth every 4 (four) hours as needed., Disp: , Rfl: ;  aspirin 81 MG tablet, Take 81 mg by mouth at bedtime. , Disp: , Rfl: ;  calcium carbonate (OS-CAL) 600 MG TABS, Take 600 mg by mouth every morning. , Disp: , Rfl: ;  cholecalciferol (VITAMIN D) 1000 UNITS tablet, Take 1,000 Units by mouth every morning. , Disp: , Rfl:  Multiple Vitamins-Minerals (CENTRUM SILVER PO), Take by mouth.  , Disp: , Rfl: ;  PARoxetine (PAXIL) 20 MG tablet, Take 20 mg by mouth every morning.  , Disp: , Rfl: ;  Rivaroxaban (XARELTO) 20 MG TABS, Take 1 tablet (20 mg total) by mouth daily., Disp: 10 tablet, Rfl: 0;  simvastatin (ZOCOR) 40 MG tablet, Take 40 mg by mouth at bedtime.  , Disp: , Rfl:  [DISCONTINUED] testosterone (ANDROGEL) 50 MG/5GM GEL, Place 5 g onto the skin daily.  , Disp: , Rfl:   Allergies: No Known Allergies  Past Medical  History, Surgical history, Social history, and Family History were reviewed and updated.  Review of Systems: Constitutional:  Negative for fever, chills, night sweats, anorexia, weight loss, pain. Cardiovascular: no chest pain or dyspnea on exertion Respiratory: negative Neurological: negative Dermatological: negative ENT: negative Skin: Negative. Gastrointestinal: negative Genito-Urinary: negative Hematological and Lymphatic: negative Breast: negative Musculoskeletal: negative Remaining ROS negative. Physical Exam: Blood pressure 110/66, pulse 86, temperature 97.4 F (36.3 C), temperature source Oral, resp. rate 20, height 5\' 9"  (1.753 m), weight 197 lb 11.2 oz (89.676 kg). ECOG: 1 General appearance: alert Head: Normocephalic, without obvious abnormality, atraumatic Neck: no adenopathy, no carotid bruit, no JVD, supple, symmetrical, trachea midline and thyroid not enlarged, symmetric, no tenderness/mass/nodules Lymph nodes: Cervical, supraclavicular, and axillary nodes normal. Heart:regular rate and rhythm, S1, S2 normal, no murmur, click, rub or gallop Lung:chest clear, no wheezing, rales, normal symmetric air entry Abdomin: soft, non-tender, without masses or organomegaly EXT:no erythema, induration, or nodules   Lab Results: Lab Results  Component Value Date   WBC 5.5 03/12/2012   HGB 14.4 03/12/2012   HCT 42.0 03/12/2012   MCV 85.0 03/12/2012   PLT 151 03/12/2012     Chemistry      Component Value Date/Time   NA 140 03/12/2012 1446   NA 138 11/28/2010 0954   K 4.4 03/12/2012 1446   K 4.3 11/28/2010 0954   CL 104 03/12/2012 1446   CL 104 11/28/2010 0954   CO2 27 03/12/2012 1446   CO2 26 11/28/2010 0954  BUN 24.0 03/12/2012 1446   BUN 23 11/27/2011 1229   CREATININE 1.2 03/12/2012 1446   CREATININE 1.1 11/27/2011 1229      Component Value Date/Time   CALCIUM 9.4 03/12/2012 1446   CALCIUM 9.0 11/28/2010 0954   ALKPHOS 60 03/12/2012 1446   ALKPHOS 46 10/18/2009 1418   AST 22  03/12/2012 1446   AST 28 10/18/2009 1418   ALT 27 03/12/2012 1446   ALT 33 10/18/2009 1418   BILITOT 0.80 03/12/2012 1446   BILITOT 0.9 10/18/2009 1418       Radiological Studies:   03/12/2012  *RADIOLOGY REPORT*  Clinical Data:  Follow-up evaluation to assess enlarged mesenteric lymph nodes.  CT CHEST, ABDOMEN AND PELVIS WITH CONTRAST  Technique:  Multidetector CT imaging of the chest, abdomen and pelvis was performed following the standard protocol during bolus administration of intravenous contrast.  Contrast: OMNIPAQUE IOHEXOL 300 MG/ML  SOLN  Comparison:  CT of the chest 11/28/2011.  CT of the abdomen and pelvis 12/01/2011.  CT CHEST  Findings:  Mediastinum: Heart size is normal. There is no significant pericardial fluid, thickening or pericardial calcification. There is atherosclerosis of the thoracic aorta, the great vessels of the mediastinum and the coronary arteries, including calcified atherosclerotic plaque in the left main, left anterior descending, left circumflex and right coronary arteries. No pathologically enlarged mediastinal or hilar lymph nodes. Esophagus is unremarkable in appearance.  Lungs/Pleura: No change in tiny 3 mm nodules in the periphery of the right upper lobe (image 23 of series 5) and in the posterior right lower lobe (image 38 of series 5).  No other larger more suspicious appearing pulmonary nodules or masses are otherwise noted.  No acute consolidative air space disease.  No pleural effusions.  Musculoskeletal: There are no aggressive appearing lytic or blastic lesions noted in the visualized portions of the skeleton.  IMPRESSION:  1.  No acute findings in the thorax. 2.  No change in the small 3 mm right upper and right lower lobe pulmonary nodules.  These are nonspecific, but may warrant attention on follow-up studies. If the patient is at high risk for bronchogenic carcinoma, follow-up chest CT at 1 year is recommended.  If the patient is at low risk, no follow-up  is needed.  This recommendation follows the consensus statement: Guidelines for Management of Small Pulmonary Nodules Detected on CT Scans:  A Statement from the Fleischner Society as published in Radiology 2005; 237:395-400. 3. Atherosclerosis, including left main and three-vessel coronary artery disease.   Assessment for potential risk factor modification, dietary therapy or pharmacologic therapy may be warranted, if clinically indicated.    CT ABDOMEN AND PELVIS  Findings:  Abdomen/Pelvis: Small calcifications in the liver likely represent granulomas.  No focal hepatic lesions are otherwise noted.  The appearance of the gallbladder, pancreas, spleen and bilateral adrenal glands is unremarkable.  Subcentimeter low attenuation lesions in the kidneys bilaterally are too small to definitively characterize, but are unchanged compared to the prior examination and are likely to represent small cysts.  There are innumerable borderline enlarged retroperitoneal lymph nodes which are conspicuous in number rather than size.  Numerous borderline and mildly enlarged mesenteric lymph nodes appear very similar to the prior examination in terms of number and size, with the largest lymph nodes measuring up to 12 mm in short axis.  Slight haziness of the small bowel mesentery is also noted, and unchanged.  No ascites or pneumoperitoneum and no pathologic distension of small bowel.  Prostate  and urinary bladder are unremarkable in appearance.  Musculoskeletal: There are no aggressive appearing lytic or blastic lesions noted in the visualized portions of the skeleton.  Multiple prominent Schmorl's nodes are noted in the spine.  IMPRESSION:  1.  Numerous borderline enlarged and mildly enlarged mesenteric lymph nodes and numerous prominent but nonenlarged retroperitoneal lymph nodes appear very similar to the prior examination.  Findings remain nonspecific.  In the absence of the clinical evidence for lymphoproliferative disorder,  this is favored to reflect a manifestation of sclerosing mesenteritis or less likely mesenteric adenitis (as this would be unusual in an older individual). Clinical correlation is recommended. 2.  Multiple small calcified granulomas in the liver. 3.  Atherosclerosis.      Impression and Plan:  This is a pleasant 67 year old gentleman with the  following issues:  1. A borderline enlarged abdominal retroperitoneal lymph node. The CT scan results discussed with Mr. Avans today. I do not see any evidence to suggest lymphoma. I think this all appear reactive in nature. However, there is a small chance that this is a very early lymphoproliferative process. For completeness, I will like to have one more imaging studies in next 3-6 months to assure stability.  He is getting a repeat CT chest in 3 months in any case, and if we can add CT A/P that will help [place] in this process.   2. Pulmonary embolus. Risk factors at this time include long travel, possible hormone therapy with testosterone. I doubt there is an underlying malignancy causing both his lymphadenopathy.     Eli Hose, MD 1/23/20143:40 PM

## 2012-03-20 ENCOUNTER — Ambulatory Visit: Payer: Medicare Other | Admitting: Family Medicine

## 2012-04-05 ENCOUNTER — Ambulatory Visit: Payer: Medicare Other | Admitting: Family Medicine

## 2012-04-06 ENCOUNTER — Other Ambulatory Visit: Payer: Self-pay

## 2012-04-15 ENCOUNTER — Other Ambulatory Visit: Payer: Self-pay

## 2012-04-15 ENCOUNTER — Ambulatory Visit (INDEPENDENT_AMBULATORY_CARE_PROVIDER_SITE_OTHER)
Admission: RE | Admit: 2012-04-15 | Discharge: 2012-04-15 | Disposition: A | Discharge status: Home / Self Care | Payer: Medicare Other | Source: Ambulatory Visit | Attending: Family Medicine | Admitting: Family Medicine

## 2012-04-15 ENCOUNTER — Encounter: Payer: Self-pay | Admitting: Family Medicine

## 2012-04-15 ENCOUNTER — Ambulatory Visit (INDEPENDENT_AMBULATORY_CARE_PROVIDER_SITE_OTHER): Payer: Medicare Other | Admitting: Family Medicine

## 2012-04-15 VITALS — BP 110/80 | HR 84 | Temp 98.3°F | Wt 194.0 lb

## 2012-04-15 DIAGNOSIS — R590 Localized enlarged lymph nodes: Secondary | ICD-10-CM | POA: Insufficient documentation

## 2012-04-15 DIAGNOSIS — M545 Low back pain, unspecified: Secondary | ICD-10-CM

## 2012-04-15 DIAGNOSIS — M47817 Spondylosis without myelopathy or radiculopathy, lumbosacral region: Secondary | ICD-10-CM | POA: Diagnosis not present

## 2012-04-15 DIAGNOSIS — I2699 Other pulmonary embolism without acute cor pulmonale: Secondary | ICD-10-CM

## 2012-04-15 DIAGNOSIS — R599 Enlarged lymph nodes, unspecified: Secondary | ICD-10-CM

## 2012-04-15 DIAGNOSIS — R1031 Right lower quadrant pain: Secondary | ICD-10-CM

## 2012-04-15 MED ORDER — RIVAROXABAN 20 MG PO TABS: 20.0000 mg | ORAL_TABLET | Freq: Every day | ORAL | Status: DC

## 2012-04-15 NOTE — Patient Instructions (Addendum)
We will call you with your xray results and with cardiology response. Let's continue Xarelto daily.  Please come see me in April for follow up.

## 2012-04-15 NOTE — Progress Notes (Signed)
Subjective:    Patient ID: Eric Rice, male    DOB: 03-Sep-1945, 67 y.o.   MRN: [id number]  HPI  67 yo very pleasant male who receives much of his care at the [place] here for follow up.  PE- diagnosed in 11/2011.  CT angio was positive for three small PEs and right pulmonary nodules (he has never smoked).  2 D echo neg for PFO.  Has been on Xarelto since- should need 6 months of therapy due to h/o testosterone use and multiple PEs (stop date in 05/2012).   Ct Angio Chest W/cm &/or Wo Cm  11/28/2011  *RADIOLOGY REPORT*  Clinical Data: Elevated D-dimer, right shoulder pain, shortness of breath, recent long car travel  CT ANGIOGRAPHY CHEST  Technique:  Multidetector CT imaging of the chest using the standard protocol during bolus administration of intravenous contrast. Multiplanar reconstructed images including MIPs were obtained and reviewed to evaluate the vascular anatomy.  Contrast: 80mL OMNIPAQUE IOHEXOL 350 MG/ML SOLN  Comparison: None  Findings: Aorta normal caliber with scattered atherosclerotic calcifications noted at aorta and coronary arteries. No aortic aneurysm or dissection identified. Visualized portion of upper abdomen unremarkable. Small filling defects are identified within bilateral lower lobe and right upper lobe pulmonary arteries compatible with small pulmonary emboli. No large or central pulmonary emboli identified. 2-3 mm diameter right lung nodules images 27, 37, and 58. Minimal dependent atelectasis at posterior lower lobes. No infiltrate, pleural effusion or pneumothorax. Bones appear diffusely demineralized.  IMPRESSION: Small pulmonary emboli identified within bilateral lower and right upper lobes. Nonspecific 2-3 mm diameter right lung nodules, recommendation below.  If the patient is at high risk for bronchogenic carcinoma, follow- up chest CT at 1 year is recommended.  If the patient is at low risk, no follow-up is needed.  This recommendation follows the consensus statement:  Guidelines for Management of Small Pulmonary Nodules Detected on CT Scans:  A Statement from the Fleischner Society as published in Radiology 2005; 237:395-400.  Critical Value/emergent results were called by telephone at the time of interpretation on 11/28/2011 at 1155 hrs to [facility], who verbally acknowledged these results.   Original Report Authenticated By: Lollie Marrow, M.D.    Right groin pain- he continues to complain of this worsening right groin pain. It was not done and his pain has continued.  He does have testicular cysts seen on testicular U/S that appear benign. Urology did not feel this was a urological issues, per pt ([facility]). Ct of abdomen pelvis neg except for some lymphadenopathy- abdominal and retroperiotoneal-   I referred him to oncology.   03/12/2012 *RADIOLOGY REPORT* Clinical Data: Follow-up evaluation to assess enlarged mesenteric lymph nodes. CT CHEST, ABDOMEN AND PELVIS WITH CONTRAST Technique: Multidetector CT imaging of the chest, abdomen and pelvis was performed following the standard protocol during bolus administration of intravenous contrast. Contrast: OMNIPAQUE IOHEXOL 300 MG/ML SOLN Comparison: CT of the chest 11/28/2011. CT of the abdomen and pelvis 12/01/2011. CT CHEST Findings: Mediastinum: Heart size is normal. There is no significant pericardial fluid, thickening or pericardial calcification. There is atherosclerosis of the thoracic aorta, the great vessels of the mediastinum and the coronary arteries, including calcified atherosclerotic plaque in the left main, left anterior descending, left circumflex and right coronary arteries. No pathologically enlarged mediastinal or hilar lymph nodes. Esophagus is unremarkable in appearance. Lungs/Pleura: No change in tiny 3 mm nodules in the periphery of the right upper lobe (image 23 of series 5) and in the posterior right  lower lobe (image 38 of series 5). No other larger more suspicious appearing pulmonary nodules or  masses are otherwise noted. No acute consolidative air space disease. No pleural effusions. Musculoskeletal: There are no aggressive appearing lytic or blastic lesions noted in the visualized portions of the skeleton. IMPRESSION: 1. No acute findings in the thorax. 2. No change in the small 3 mm right upper and right lower lobe pulmonary nodules. These are nonspecific, but may warrant attention on follow-up studies. If the patient is at high risk for bronchogenic carcinoma, follow-up chest CT at 1 year is recommended. If the patient is at low risk, no follow-up is needed. This recommendation follows the consensus statement: Guidelines for Management of Small Pulmonary Nodules Detected on CT Scans: A Statement from the Fleischner Society as published in Radiology 2005; 237:395-400. 3. Atherosclerosis, including left main and three-vessel coronary artery disease. Assessment for potential risk factor modification, dietary therapy or pharmacologic therapy may be warranted, if clinically indicated.  CT ABDOMEN AND PELVIS Findings: Abdomen/Pelvis: Small calcifications in the liver likely represent granulomas. No focal hepatic lesions are otherwise noted. The appearance of the gallbladder, pancreas, spleen and bilateral adrenal glands is unremarkable. Subcentimeter low attenuation lesions in the kidneys bilaterally are too small to definitively characterize, but are unchanged compared to the prior examination and are likely to represent small cysts. There are innumerable borderline enlarged retroperitoneal lymph nodes which are conspicuous in number rather than size. Numerous borderline and mildly enlarged mesenteric lymph nodes appear very similar to the prior examination in terms of number and size, with the largest lymph nodes measuring up to 12 mm in short axis. Slight haziness of the small bowel mesentery is also noted, and unchanged. No ascites or pneumoperitoneum and no pathologic distension of small bowel.  Prostate and urinary bladder are unremarkable in appearance. Musculoskeletal: There are no aggressive appearing lytic or blastic lesions noted in the visualized portions of the skeleton. Multiple prominent Schmorl's nodes are noted in the spine. IMPRESSION: 1. Numerous borderline enlarged and mildly enlarged mesenteric lymph nodes and numerous prominent but nonenlarged retroperitoneal lymph nodes appear very similar to the prior examination. Findings remain nonspecific. In the absence of the clinical evidence for lymphoproliferative disorder, this is favored to reflect a manifestation of sclerosing mesenteritis or less likely mesenteric adenitis (as this would be unusual in an older individual). Clinical correlation is recommended. 2. Multiple small calcified granulomas in the liver. 3. Atherosclerosis.   Last saw Dr. Clelia Croft on 03/14/2012- note reviewed. He felt this was likely reactive in nature.  For completeness, he does want to do one more imaging studies in next 3-6 months to assure stability.   Patient Active Problem List  Diagnosis  . DIVERTICULAR DISEASE  . Testicular mass  . Long term use of drug  . Malaise  . Acute pulmonary embolism  . Right groin pain  . Lymphadenopathy, abdominal   Past Medical History  Diagnosis Date  . Skin cancer   . Fibromyalgia   . Chest pain     neg adenosine myoview in 2008   No past surgical history on file. History  Substance Use Topics  . Smoking status: Never Smoker   . Smokeless tobacco: Not on file  . Alcohol Use: Not on file   Family History  Problem Relation Age of Onset  . Heart disease Brother 77    MI   No Known Allergies Current Outpatient Prescriptions on File Prior to Visit  Medication Sig Dispense Refill  . acetaminophen-codeine (TYLENOL #  3) 300-30 MG per tablet Take 1 tablet by mouth every 4 (four) hours as needed.      Marland Kitchen aspirin 81 MG tablet Take 81 mg by mouth at bedtime.       . calcium carbonate (OS-CAL) 600 MG TABS Take  600 mg by mouth every morning.       . cholecalciferol (VITAMIN D) 1000 UNITS tablet Take 1,000 Units by mouth every morning.       . Multiple Vitamins-Minerals (CENTRUM SILVER PO) Take by mouth.        Marland Kitchen PARoxetine (PAXIL) 20 MG tablet Take 20 mg by mouth every morning.        . Rivaroxaban (XARELTO) 20 MG TABS Take 1 tablet (20 mg total) by mouth daily.  10 tablet  0  . simvastatin (ZOCOR) 40 MG tablet Take 40 mg by mouth at bedtime.        . [DISCONTINUED] testosterone (ANDROGEL) 50 MG/5GM GEL Place 5 g onto the skin daily.         No current facility-administered medications on file prior to visit.   The PMH, PSH, Social History, Family History, Medications, and allergies have been reviewed in [facility], and have been updated if relevant.   Review of Systems See HPI No CP Still has some DOE    Objective:   Physical Exam BP 110/80  Pulse 84  Temp(Src) 98.3 F (36.8 C)  Wt 194 lb (87.998 kg)  BMI 28.64 kg/m2  General:  overweght male in NAD Eyes:  PERRL Ears:  External ear exam shows no significant lesions or deformities.  Otoscopic examination reveals clear canals, tympanic membranes are intact bilaterally without bulging, retraction, inflammation or discharge. Hearing is grossly normal bilaterally. Nose:  External nasal examination shows no deformity or inflammation. Nasal mucosa are pink and moist without lesions or exudates. Mouth:  Oral mucosa and oropharynx without lesions or exudates.  Teeth in good repair. Neck:  no carotid bruit or thyromegaly no cervical or supraclavicular lymphadenopathy  Lungs:  Normal respiratory effort, chest expands symmetrically. Lungs are clear to auscultation, no crackles or wheezes. Heart:  Normal rate and regular rhythm. S1 and S2 normal without gallop, murmur, click, rub or other extra sounds. Abdomen:  Bowel sounds positive,abdomen soft and non-tender without masses, organomegaly or hernias noted. Still very tender to palpation over right  groin Pulses:  R and L posterior tibial pulses are full and equal bilaterally  Extremities:  no edema      Assessment & Plan:   1. Acute pulmonary embolism Continue xarelto 20 mg daily for 6 months total (through 05/2012). Repeat CT of chest given multiple PEs in 05/2012.  2. Lymphadenopathy, abdominal See oncology note- repeat abdominal CT in 05/2012.  3. Right groin pain ? Lumbar nerve issues-  - DG Lumbar Spine Complete; Future

## 2012-04-16 ENCOUNTER — Other Ambulatory Visit: Payer: Self-pay | Admitting: Family Medicine

## 2012-04-16 ENCOUNTER — Telehealth: Payer: Self-pay | Admitting: Family Medicine

## 2012-04-16 NOTE — Telephone Encounter (Signed)
MRI cancelled 

## 2012-04-16 NOTE — Telephone Encounter (Signed)
Patient called to say that he wants the MRI order cancelled right now. He is dealing with a $2800 bill for a CT Scan that was done at [facility] and needs to dela with that right now. Please cancel this order in Epic as I cant cancel.

## 2012-05-02 ENCOUNTER — Telehealth: Payer: Self-pay

## 2012-05-02 NOTE — Telephone Encounter (Signed)
Pt request 6 mth f/u MRI done for pulmonary embolism. Pt would like to have done in [place] the first week in April with an afternoon appt. Please advise.

## 2012-05-02 NOTE — Telephone Encounter (Signed)
It is a CT scan, not an MRI. Order entered.

## 2012-05-02 NOTE — Telephone Encounter (Signed)
Spoke with patient and advised results, advised Shirlee Limerick should be giving him a call to have that scheduled

## 2012-05-03 ENCOUNTER — Telehealth: Payer: Self-pay | Admitting: *Deleted

## 2012-05-03 ENCOUNTER — Telehealth: Payer: Self-pay | Admitting: Radiology

## 2012-05-03 DIAGNOSIS — I2699 Other pulmonary embolism without acute cor pulmonale: Secondary | ICD-10-CM

## 2012-05-03 NOTE — Telephone Encounter (Signed)
Pt needs lab work prior to CT scans, please order.

## 2012-05-03 NOTE — Telephone Encounter (Signed)
Lab order for ct

## 2012-05-03 NOTE — Telephone Encounter (Signed)
Orders entered

## 2012-05-20 ENCOUNTER — Other Ambulatory Visit (INDEPENDENT_AMBULATORY_CARE_PROVIDER_SITE_OTHER): Payer: Medicare Other

## 2012-05-20 ENCOUNTER — Other Ambulatory Visit: Payer: Medicare Other

## 2012-05-20 DIAGNOSIS — I2699 Other pulmonary embolism without acute cor pulmonale: Secondary | ICD-10-CM | POA: Diagnosis not present

## 2012-05-20 LAB — BASIC METABOLIC PANEL
CO2: 27 mEq/L (ref 19–32)
Calcium: 9.1 mg/dL (ref 8.4–10.5)
Creatinine, Ser: 1 mg/dL (ref 0.4–1.5)
GFR: 81.93 mL/min (ref >60.00)
Glucose, Bld: 123 mg/dL — ABNORMAL HIGH (ref 70–99)
Sodium: 136 mEq/L (ref 135–145)

## 2012-05-27 ENCOUNTER — Ambulatory Visit (INDEPENDENT_AMBULATORY_CARE_PROVIDER_SITE_OTHER)
Admission: RE | Admit: 2012-05-27 | Discharge: 2012-05-27 | Disposition: A | Discharge status: Home / Self Care | Payer: Medicare Other | Source: Ambulatory Visit | Attending: Family Medicine | Admitting: Family Medicine

## 2012-05-27 DIAGNOSIS — R590 Localized enlarged lymph nodes: Secondary | ICD-10-CM

## 2012-05-27 DIAGNOSIS — I2699 Other pulmonary embolism without acute cor pulmonale: Secondary | ICD-10-CM

## 2012-05-27 DIAGNOSIS — R911 Solitary pulmonary nodule: Secondary | ICD-10-CM | POA: Diagnosis not present

## 2012-05-27 DIAGNOSIS — I700 Atherosclerosis of aorta: Secondary | ICD-10-CM | POA: Diagnosis not present

## 2012-05-27 DIAGNOSIS — R599 Enlarged lymph nodes, unspecified: Secondary | ICD-10-CM

## 2012-05-27 DIAGNOSIS — D235 Other benign neoplasm of skin of trunk: Secondary | ICD-10-CM | POA: Diagnosis not present

## 2012-05-27 DIAGNOSIS — Z85828 Personal history of other malignant neoplasm of skin: Secondary | ICD-10-CM | POA: Diagnosis not present

## 2012-05-27 MED ORDER — IOHEXOL 350 MG/ML SOLN
100.0000 mL | Freq: Once | INTRAVENOUS | Status: AC | PRN
  Administered 2012-05-27: 100 mL via INTRAVENOUS

## 2012-05-29 ENCOUNTER — Ambulatory Visit (INDEPENDENT_AMBULATORY_CARE_PROVIDER_SITE_OTHER): Payer: Medicare Other | Admitting: Family Medicine

## 2012-05-29 ENCOUNTER — Encounter: Payer: Self-pay | Admitting: Family Medicine

## 2012-05-29 VITALS — BP 120/80 | HR 90 | Temp 97.9°F | Wt 195.0 lb

## 2012-05-29 DIAGNOSIS — I2699 Other pulmonary embolism without acute cor pulmonale: Secondary | ICD-10-CM

## 2012-05-29 DIAGNOSIS — R590 Localized enlarged lymph nodes: Secondary | ICD-10-CM

## 2012-05-29 DIAGNOSIS — E291 Testicular hypofunction: Secondary | ICD-10-CM

## 2012-05-29 DIAGNOSIS — R7989 Other specified abnormal findings of blood chemistry: Secondary | ICD-10-CM | POA: Insufficient documentation

## 2012-05-29 DIAGNOSIS — R599 Enlarged lymph nodes, unspecified: Secondary | ICD-10-CM

## 2012-05-29 NOTE — Progress Notes (Signed)
Subjective:    Patient ID: Eric Rice, male    DOB: [birth year], 67 y.o.   MRN: [id number]  HPI  67 yo very pleasant male who receives much of his care at the [place] here for follow up.  PE- diagnosed in 11/2011.  CT angio was positive for three small PEs and right pulmonary nodules (he has never smoked).  2 D echo neg for PFO.  Has been on Xarelto since- should need 6 months of therapy due to h/o testosterone use and multiple PEs, stop date should be this month.  CT angio this month without evidence of PE.  Ct Angio Chest Pe W/cm &/or Wo Cm  05/27/2012  *RADIOLOGY REPORT*  Clinical Data:  History of pulmonary embolism on Coumadin. Evaluate for residual thrombus.  Follow-up abdominal lymphadenopathy.  CT ANGIOGRAPHY CHEST CT ABDOMEN AND PELVIS WITH CONTRAST  Technique:  Multidetector CT imaging of the chest was performed using the standard protocol during bolus administration of intravenous contrast.  Multiplanar CT image reconstructions including MIPs were obtained to evaluate the vascular anatomy. Multidetector CT imaging of the abdomen and pelvis was performed using the standard protocol during bolus administration of intravenous contrast.  Contrast: OMNIPAQUE IOHEXOL 350 MG/ML SOLN,  Comparison:  Prior examinations 11/28/2011, 12/01/2011 and 03/12/2012.  CTA CHEST  Findings:  The pulmonary arteries are well opacified with contrast. There is no evidence of residual or recurrent pulmonary embolism. Atherosclerosis of the aorta, great vessels and coronary arteries is again noted.  There are no enlarged mediastinal or hilar lymph nodes.  There is no pleural or pericardial effusion.  There are no suspicious pulmonary findings.  There is a tiny right upper lobe nodule on image 21.  There is also minimal left upper lobe nodularity on image 20. A right lower lobe nodule on image 48 is stable.  There is no endobronchial lesion or confluent airspace opacity.   Review of the MIP images confirms the above  findings.  IMPRESSION:  1.  No evidence of residual or recurrent pulmonary embolism. 2.  No thoracic adenopathy or acute process.    Right groin pain- he continues to complain of this intermittently. It was not done and his pain has continued.  He does have testicular cysts seen on testicular U/S that appear benign. Urology did not feel this was a urological issues, per pt ([facility]). Ct of abdomen pelvis neg except for some lymphadenopathy- abdominal and retroperiotoneal-   I referred him to oncology.  Last saw Dr. Clelia Croft on 03/14/2012- note reviewed. He felt this was likely reactive in nature.  For completeness, he did want to repeat CT scan of abdomen/pelvis this month which was stable.  CT ABDOMEN AND PELVIS  Findings: The liver, gallbladder, biliary system, pancreas and spleen appear unchanged.  There is no adrenal mass.  Small low- density renal lesions bilaterally are unchanged.  Again demonstrated are multiple prominent mesenteric lymph nodes with adjacent soft tissue stranding.  Compared with the prior examination, no significant change is identified.  In addition to the mesenteric nodes, there are mildly prominent portacaval and retroperitoneal lymph nodes which are stable. There is no significant pelvic adenopathy.  Aorto iliac atherosclerosis appears stable.  The bladder and prostate gland appear unremarkable.  No abnormalities of the stomach, small bowel or colon are seen.  There are stable degenerative changes throughout the spine associated with a scoliosis.  No worrisome osseous findings are identified.  Review of the MIP images confirms the above findings.  IMPRESSION:  1.  Overall, little change in multiple prominent mesenteric and retroperitoneal lymph nodes compared with the prior to studies dating back to 12/01/2011.  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 2.  No acute abdominal findings identified.   Original Report  Authenticated By: Carey Bullocks, M.D.    Patient Active Problem List  Diagnosis  . DIVERTICULAR DISEASE  . Testicular mass  . Long term use of drug  . Malaise  . Acute pulmonary embolism  . Right groin pain  . Lymphadenopathy, abdominal  . Low testosterone   Past Medical History  Diagnosis Date  . Skin cancer   . Fibromyalgia   . Chest pain     neg adenosine myoview in 2008   No past surgical history on file. History  Substance Use Topics  . Smoking status: Never Smoker   . Smokeless tobacco: Not on file  . Alcohol Use: Not on file   Family History  Problem Relation Age of Onset  . Heart disease Brother 81    MI   No Known Allergies Current Outpatient Prescriptions on File Prior to Visit  Medication Sig Dispense Refill  . aspirin 81 MG tablet Take 81 mg by mouth at bedtime.       . calcium carbonate (OS-CAL) 600 MG TABS Take 600 mg by mouth every morning.       . cholecalciferol (VITAMIN D) 1000 UNITS tablet Take 1,000 Units by mouth every morning.       . Multiple Vitamins-Minerals (CENTRUM SILVER PO) Take by mouth.        Marland Kitchen PARoxetine (PAXIL) 20 MG tablet Take 20 mg by mouth every morning.        . Rivaroxaban (XARELTO) 20 MG TABS Take 1 tablet (20 mg total) by mouth daily.  20 tablet  0  . simvastatin (ZOCOR) 40 MG tablet Take 40 mg by mouth at bedtime.        . [DISCONTINUED] testosterone (ANDROGEL) 50 MG/5GM GEL Place 5 g onto the skin daily.         No current facility-administered medications on file prior to visit.   The PMH, PSH, Social History, Family History, Medications, and allergies have been reviewed in [facility], and have been updated if relevant.   Review of Systems See HPI No CP Still has some DOE but feels this is due to deconditioning    Objective:   Physical Exam  BP 120/80  Pulse 90  Temp(Src) 97.9 F (36.6 C)  Wt 195 lb (88.451 kg)  BMI 28.78 kg/m2  General:  Pleasant male in NAD Eyes:  PERRL Ears:  External ear exam shows no  significant lesions or deformities.  Otoscopic examination reveals clear canals, tympanic membranes are intact bilaterally without bulging, retraction, inflammation or discharge. Hearing is grossly normal bilaterally. Nose:  External nasal examination shows no deformity or inflammation. Nasal mucosa are pink and moist without lesions or exudates. Mouth:  Oral mucosa and oropharynx without lesions or exudates.  Teeth in good repair. Neck:  no carotid bruit or thyromegaly no cervical or supraclavicular lymphadenopathy  Lungs:  Normal respiratory effort, chest expands symmetrically. Lungs are clear to auscultation, no crackles or wheezes. Heart:  Normal rate and regular rhythm. S1 and S2 normal without gallop, murmur, click, rub or other extra sounds. Abdomen:  Bowel sounds positive,abdomen soft and non-tender without masses, organomegaly or hernias noted. Still very tender to palpation over right groin Pulses:  R and L posterior tibial pulses are full  and equal bilaterally  Extremities:  no edema      Assessment & Plan:  1. Acute pulmonary embolism Resolved. S/p 6 months of xarelto. Ok to d/c xarelto.  2. Lymphadenopathy, abdominal Ct reassuring, likely benign.

## 2012-09-25 ENCOUNTER — Other Ambulatory Visit: Payer: Self-pay

## 2012-11-08 DIAGNOSIS — D235 Other benign neoplasm of skin of trunk: Secondary | ICD-10-CM | POA: Diagnosis not present

## 2012-11-08 DIAGNOSIS — L570 Actinic keratosis: Secondary | ICD-10-CM | POA: Diagnosis not present

## 2012-11-18 DIAGNOSIS — L290 Pruritus ani: Secondary | ICD-10-CM | POA: Diagnosis not present

## 2012-11-19 ENCOUNTER — Ambulatory Visit (INDEPENDENT_AMBULATORY_CARE_PROVIDER_SITE_OTHER): Payer: Medicare Other | Admitting: Family Medicine

## 2012-11-19 ENCOUNTER — Encounter: Payer: Self-pay | Admitting: Family Medicine

## 2012-11-19 VITALS — BP 124/74 | HR 74 | Temp 97.9°F | Wt 201.2 lb

## 2012-11-19 DIAGNOSIS — R350 Frequency of micturition: Secondary | ICD-10-CM | POA: Diagnosis not present

## 2012-11-19 DIAGNOSIS — IMO0001 Reserved for inherently not codable concepts without codable children: Secondary | ICD-10-CM

## 2012-11-19 DIAGNOSIS — M797 Fibromyalgia: Secondary | ICD-10-CM

## 2012-11-19 DIAGNOSIS — R351 Nocturia: Secondary | ICD-10-CM | POA: Insufficient documentation

## 2012-11-19 DIAGNOSIS — R5381 Other malaise: Secondary | ICD-10-CM

## 2012-11-19 LAB — POCT URINALYSIS DIPSTICK
Bilirubin, UA: NEGATIVE
Glucose, UA: NEGATIVE
Ketones, UA: NEGATIVE
Leukocytes, UA: NEGATIVE
Nitrite, UA: NEGATIVE
pH, UA: 6.5

## 2012-11-19 LAB — COMPREHENSIVE METABOLIC PANEL
AST: 22 U/L (ref 0–37)
Alkaline Phosphatase: 41 U/L (ref 39–117)
BUN: 29 mg/dL — ABNORMAL HIGH (ref 6–23)
Creatinine, Ser: 1 mg/dL (ref 0.4–1.5)
Glucose, Bld: 77 mg/dL (ref 70–99)
Potassium: 4.3 mEq/L (ref 3.5–5.1)
Total Bilirubin: 0.7 mg/dL (ref 0.3–1.2)

## 2012-11-19 LAB — CBC WITH DIFFERENTIAL/PLATELET
Basophils Relative: 0.6 % (ref 0.0–3.0)
Eosinophils Relative: 4.5 % (ref 0.0–5.0)
Lymphocytes Relative: 20.2 % (ref 12.0–46.0)
MCHC: 34 g/dL (ref 30.0–36.0)
Monocytes Absolute: 0.6 10*3/uL (ref 0.1–1.0)
Monocytes Relative: 9.2 % (ref 3.0–12.0)
Neutrophils Relative %: 65.5 % (ref 43.0–77.0)
Platelets: 160 10*3/uL (ref 150.0–400.0)
RBC: 4.59 Mil/uL (ref 4.22–5.81)
WBC: 6.7 10*3/uL (ref 4.5–10.5)

## 2012-11-19 LAB — PSA, MEDICARE: PSA: 0.55 ng/ml (ref 0.10–4.00)

## 2012-11-19 NOTE — Patient Instructions (Addendum)
Great to see you. We will call you with your lab results.  Please thank Darl Pikes as well for my gift basket.  You are both so thoughtful.

## 2012-11-19 NOTE — Progress Notes (Signed)
Subjective:    Patient ID: Eric Rice, male    DOB: 04-02-1945, 67 y.o.   MRN: [id number]  HPI  Very pleasant 67 yo male here to discuss a few issues.  1.  Nocturia with increased day time urinary frequency as well. No hematuria (does have remote h/o neg hematuria work up), no dysuria, not much increased urinary hesitancy.   Notices these symptoms for past 2-3 months. No FH of prostate CA. Lab Results  Component Value Date   PSA 0.53 11/27/2011   PSA 0.76 11/28/2010   PSA 0.83 10/18/2009   2.  Worsening fibromyalgia pain- Also for past two to three months, increased aches and pains- yesterday had shooting pain down his left leg that resolved.  A few weeks prior, "piercing pain in right arm." All resolve pretty quickly.  Increased fatigue with this.  Not sure if this is his typical "fibro pain." Has tried Lyrica and neurontin in past.  Patient Active Problem List   Diagnosis Date Noted  . Increased urinary frequency 11/19/2012  . Nocturia 11/19/2012  . Fibromyalgia 11/19/2012  . 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 Lymphadenopathy, abdominal 04/15/2012  . Acute pulmonary embolism 11/30/2011  . Right groin pain 11/30/2011  . Malaise 11/27/2011  . Testicular mass 11/28/2010  . Long term use of drug 11/28/2010  . DIVERTICULAR DISEASE 03/09/2010   Past Medical History  Diagnosis Date  . Skin cancer   . Fibromyalgia   . Chest pain     neg adenosine myoview in 2008   No past surgical history on file. History  Substance Use Topics  . Smoking status: Never Smoker   . Smokeless tobacco: Not on file  . Alcohol Use: Not on file   Family History  Problem Relation Age of Onset  . Heart disease Brother 53    MI   No Known Allergies Current Outpatient Prescriptions on File Prior to Visit  Medication Sig Dispense Refill  . aspirin 81 MG tablet Take 81 mg by mouth at bedtime.       . calcium carbonate (OS-CAL) 600 MG TABS Take 600 mg by mouth every morning.       . cholecalciferol  (VITAMIN D) 1000 UNITS tablet Take 1,000 Units by mouth every morning.       . Multiple Vitamins-Minerals (CENTRUM SILVER PO) Take by mouth.        Marland Kitchen PARoxetine (PAXIL) 20 MG tablet Take 20 mg by mouth every morning.        . simvastatin (ZOCOR) 40 MG tablet Take 40 mg by mouth at bedtime.        . [DISCONTINUED] testosterone (ANDROGEL) 50 MG/5GM GEL Place 5 g onto the skin daily.         No current facility-administered medications on file prior to visit.   The PMH, PSH, Social History, Family History, Medications, and allergies have been reviewed in [facility], and have been updated if relevant.   Review of Systems    See HPI No changes in bowel habits No sexual dysfunction Objective:   Physical Exam  BP 124/74  Pulse 74  Temp(Src) 97.9 F (36.6 C) (Oral)  Wt 201 lb 4 oz (91.286 kg)  BMI 29.71 kg/m2  SpO2 98% General:  Pleasant male in NAD Eyes:  PERRL Ears:  External ear exam shows no significant lesions or deformities.  Otoscopic examination reveals clear canals, tympanic membranes are intact bilaterally without bulging, retraction, inflammation or discharge. Hearing is grossly normal bilaterally. Nose:  External nasal  examination shows no deformity or inflammation. Nasal mucosa are pink and moist without lesions or exudates. Mouth:  Oral mucosa and oropharynx without lesions or exudates.  Teeth in good repair. Neck:  no carotid bruit or thyromegaly no cervical or supraclavicular lymphadenopathy  Lungs:  Normal respiratory effort, chest expands symmetrically. Lungs are clear to auscultation, no crackles or wheezes. Heart:  Normal rate and regular rhythm. S1 and S2 normal without gallop, murmur, click, rub or other extra sounds. Abdomen:  Bowel sounds positive,abdomen soft and non-tender without masses, organomegaly or hernias noted. Prostate:  Prostate gland firm and smooth, 1 plus enlargement, no nodularity, tenderness, mass, asymmetry or induration. Pulses:  R and L posterior  tibial pulses are full and equal bilaterally  Extremities:  no edema     Assessment & Plan:  1. Increased urinary frequency Exam reassuring- no nodules on prostate exam today. UA neg. Will check labs, including PSA today. Has tried flomax in past- could not tolerate side effects. - Hemoglobin A1c - Comprehensive metabolic panel - POCT urinalysis dipstick  2. Nocturia See above. - Hemoglobin A1c - Comprehensive metabolic panel - PSA, Medicare - POCT urinalysis dipstick  3. Fibromyalgia Deteriorated. He is starting exercise program which I hope will be helpful. He will update me.

## 2012-11-19 NOTE — Addendum Note (Signed)
Addended by: Criselda Peaches B on: 11/19/2012 02:00 PM   Modules accepted: Orders

## 2012-12-02 DIAGNOSIS — L290 Pruritus ani: Secondary | ICD-10-CM | POA: Diagnosis not present

## 2012-12-17 DIAGNOSIS — L290 Pruritus ani: Secondary | ICD-10-CM | POA: Diagnosis not present

## 2012-12-17 DIAGNOSIS — K649 Unspecified hemorrhoids: Secondary | ICD-10-CM | POA: Diagnosis not present

## 2013-01-30 DIAGNOSIS — Z8709 Personal history of other diseases of the respiratory system: Secondary | ICD-10-CM | POA: Diagnosis not present

## 2013-01-31 DIAGNOSIS — R030 Elevated blood-pressure reading, without diagnosis of hypertension: Secondary | ICD-10-CM | POA: Insufficient documentation

## 2013-01-31 DIAGNOSIS — J069 Acute upper respiratory infection, unspecified: Secondary | ICD-10-CM | POA: Diagnosis not present

## 2013-01-31 DIAGNOSIS — IMO0002 Reserved for concepts with insufficient information to code with codable children: Secondary | ICD-10-CM | POA: Diagnosis not present

## 2013-02-10 DIAGNOSIS — J40 Bronchitis, not specified as acute or chronic: Secondary | ICD-10-CM | POA: Diagnosis not present

## 2013-03-04 ENCOUNTER — Other Ambulatory Visit: Payer: Self-pay | Admitting: Physician Assistant

## 2013-03-04 DIAGNOSIS — D485 Neoplasm of uncertain behavior of skin: Secondary | ICD-10-CM | POA: Diagnosis not present

## 2013-03-04 DIAGNOSIS — C4490 Unspecified malignant neoplasm of skin, unspecified: Secondary | ICD-10-CM

## 2013-03-04 DIAGNOSIS — Z85828 Personal history of other malignant neoplasm of skin: Secondary | ICD-10-CM

## 2013-03-04 DIAGNOSIS — D2370 Other benign neoplasm of skin of unspecified lower limb, including hip: Secondary | ICD-10-CM | POA: Diagnosis not present

## 2013-03-04 HISTORY — DX: Personal history of other malignant neoplasm of skin: Z85.828

## 2013-03-04 HISTORY — DX: Unspecified malignant neoplasm of skin, unspecified: C44.90

## 2013-03-11 ENCOUNTER — Other Ambulatory Visit: Payer: Self-pay | Admitting: Family Medicine

## 2013-03-11 ENCOUNTER — Ambulatory Visit: Payer: Medicare Other | Admitting: Family Medicine

## 2013-03-11 DIAGNOSIS — N50812 Left testicular pain: Secondary | ICD-10-CM

## 2013-03-11 DIAGNOSIS — IMO0001 Reserved for inherently not codable concepts without codable children: Secondary | ICD-10-CM | POA: Diagnosis not present

## 2013-03-11 DIAGNOSIS — R81 Glycosuria: Secondary | ICD-10-CM | POA: Diagnosis not present

## 2013-03-11 DIAGNOSIS — R109 Unspecified abdominal pain: Secondary | ICD-10-CM | POA: Diagnosis not present

## 2013-03-12 ENCOUNTER — Ambulatory Visit
Admission: RE | Admit: 2013-03-12 | Discharge: 2013-03-12 | Disposition: A | Discharge status: Home / Self Care | Payer: Medicare Other | Source: Ambulatory Visit | Attending: Family Medicine | Admitting: Family Medicine

## 2013-03-12 DIAGNOSIS — N50812 Left testicular pain: Secondary | ICD-10-CM

## 2013-03-12 DIAGNOSIS — N508 Other specified disorders of male genital organs: Secondary | ICD-10-CM | POA: Diagnosis not present

## 2013-03-14 ENCOUNTER — Other Ambulatory Visit: Payer: Self-pay | Admitting: Family Medicine

## 2013-03-14 DIAGNOSIS — R81 Glycosuria: Secondary | ICD-10-CM | POA: Diagnosis not present

## 2013-03-14 DIAGNOSIS — K219 Gastro-esophageal reflux disease without esophagitis: Secondary | ICD-10-CM | POA: Diagnosis not present

## 2013-03-14 DIAGNOSIS — M25552 Pain in left hip: Secondary | ICD-10-CM

## 2013-03-14 DIAGNOSIS — R109 Unspecified abdominal pain: Secondary | ICD-10-CM | POA: Diagnosis not present

## 2013-03-14 DIAGNOSIS — R1030 Lower abdominal pain, unspecified: Secondary | ICD-10-CM

## 2013-03-19 ENCOUNTER — Ambulatory Visit
Admission: RE | Admit: 2013-03-19 | Discharge: 2013-03-19 | Disposition: A | Discharge status: Home / Self Care | Payer: Medicare Other | Source: Ambulatory Visit | Attending: Family Medicine | Admitting: Family Medicine

## 2013-03-19 DIAGNOSIS — R1030 Lower abdominal pain, unspecified: Secondary | ICD-10-CM

## 2013-03-19 DIAGNOSIS — M25559 Pain in unspecified hip: Secondary | ICD-10-CM | POA: Diagnosis not present

## 2013-03-19 DIAGNOSIS — M25552 Pain in left hip: Secondary | ICD-10-CM

## 2013-03-19 DIAGNOSIS — R599 Enlarged lymph nodes, unspecified: Secondary | ICD-10-CM | POA: Diagnosis not present

## 2013-03-19 MED ORDER — IOHEXOL 300 MG/ML  SOLN
125.0000 mL | Freq: Once | INTRAMUSCULAR | Status: AC | PRN
  Administered 2013-03-19: 125 mL via INTRAVENOUS

## 2013-03-21 DIAGNOSIS — R109 Unspecified abdominal pain: Secondary | ICD-10-CM | POA: Diagnosis not present

## 2013-04-14 ENCOUNTER — Encounter (INDEPENDENT_AMBULATORY_CARE_PROVIDER_SITE_OTHER): Payer: Self-pay | Admitting: General Surgery

## 2013-04-14 ENCOUNTER — Ambulatory Visit (INDEPENDENT_AMBULATORY_CARE_PROVIDER_SITE_OTHER): Payer: Medicare Other | Admitting: General Surgery

## 2013-04-14 VITALS — BP 110/80 | HR 72 | Temp 98.1°F | Resp 14 | Ht 69.0 in | Wt 199.8 lb

## 2013-04-14 DIAGNOSIS — R1032 Left lower quadrant pain: Secondary | ICD-10-CM | POA: Insufficient documentation

## 2013-04-14 DIAGNOSIS — R109 Unspecified abdominal pain: Secondary | ICD-10-CM | POA: Diagnosis not present

## 2013-04-14 NOTE — Patient Instructions (Signed)
No hernia felt. This may be a muscular strain versus a fibromyalgia trigger point.

## 2013-04-14 NOTE — Progress Notes (Signed)
Patient ID: Eric Rice, male   DOB: 10-15-1945, 68 y.o.   MRN: [id number]  Chief Complaint  Patient presents with  . New Evaluation    eval CT /enlarged mesenteric lymh nodes    HPI Eric Rice is a 68 y.o. male.   HPI  He is referred by Dr. Ernie Hew because of enlarged mesenteric lymph nodes and left groin pain. He has known about the lymph nodes for some time. The left groin pain has been evaluated by multiple people but no definite diagnosis has been noted. CT scan has not showed evidence of hernia. Nobody has palpated a hernia on exam. It seems to be related to activity at times when he is done some heavy lifting but not always. He does have chronic pain from fibromyalgia and has had this for many years.  No family history of hernia. No constipation.  Past Medical History  Diagnosis Date  . Fibromyalgia   . Chest pain     neg adenosine myoview in 2008  . Arthritis   . Hyperlipidemia   . Skin cancer     History reviewed. No pertinent past surgical history.  Family History  Problem Relation Age of Onset  . Heart disease Brother 32    MI  . Heart disease Mother   . Heart disease Father     Social History History  Substance Use Topics  . Smoking status: Never Smoker   . Smokeless tobacco: Never Used  . Alcohol Use: No    No Known Allergies  Current Outpatient Prescriptions  Medication Sig Dispense Refill  . aspirin 81 MG tablet Take 81 mg by mouth at bedtime.       . calcium carbonate (OS-CAL) 600 MG TABS Take 600 mg by mouth every morning.       . cholecalciferol (VITAMIN D) 1000 UNITS tablet Take 1,000 Units by mouth every morning.       Marland Kitchen PARoxetine (PAXIL) 20 MG tablet Take 20 mg by mouth every morning.        . simvastatin (ZOCOR) 40 MG tablet Take 40 mg by mouth at bedtime.        . Multiple Vitamins-Minerals (CENTRUM SILVER PO) Take by mouth.        . [DISCONTINUED] testosterone (ANDROGEL) 50 MG/5GM GEL Place 5 g onto the skin daily.         No current  facility-administered medications for this visit.    Review of Systems Review of Systems  Constitutional: Negative.   Respiratory: Negative.   Cardiovascular: Negative.   Gastrointestinal: Positive for abdominal pain. Negative for constipation.  Genitourinary: Positive for hematuria.  Hematological: Positive for adenopathy.    Blood pressure 110/80, pulse 72, temperature 98.1 F (36.7 C), temperature source Oral, resp. rate 14, height 5\' 9"  (1.753 m), weight 199 lb 12.8 oz (90.629 kg).  Physical Exam Physical Exam  Constitutional: He appears well-developed and well-nourished. No distress.  HENT:  Head: Normocephalic and atraumatic.  Neck: Neck supple.  Abdominal: Soft. He exhibits no distension and no mass. There is no tenderness.  Small reducible umbilical hernia  Genitourinary:  No palpable inguinal hernias. No groin tenderness.  Musculoskeletal:  No axillary, supraclavicular, or inguinal adenopathy the  Lymphadenopathy:    He has no cervical adenopathy.    Data Reviewed CT scan  Assessment    1. Slightly prominent mesenteric lymph nodes which have been stable since previous CT scans or even a little smaller.  2. Chronic intermittent left groin pain  without evidence of hernia. Could be muscular strain versus fibromyalgia trigger point.     Plan    No further evaluation necessary in my gain. No indication for surgical intervention unless the lymph node start getting larger in size than I would recommend a biopsy.        Charnise Lovan J 04/14/2013, 1:58 PM

## 2013-06-10 DIAGNOSIS — L819 Disorder of pigmentation, unspecified: Secondary | ICD-10-CM | POA: Diagnosis not present

## 2013-06-10 DIAGNOSIS — D1801 Hemangioma of skin and subcutaneous tissue: Secondary | ICD-10-CM | POA: Diagnosis not present

## 2013-06-10 DIAGNOSIS — L821 Other seborrheic keratosis: Secondary | ICD-10-CM | POA: Diagnosis not present

## 2013-06-10 DIAGNOSIS — D235 Other benign neoplasm of skin of trunk: Secondary | ICD-10-CM | POA: Diagnosis not present

## 2013-08-04 DIAGNOSIS — Z86711 Personal history of pulmonary embolism: Secondary | ICD-10-CM | POA: Insufficient documentation

## 2013-08-04 DIAGNOSIS — M25529 Pain in unspecified elbow: Secondary | ICD-10-CM | POA: Diagnosis not present

## 2013-08-04 DIAGNOSIS — R358 Other polyuria: Secondary | ICD-10-CM | POA: Insufficient documentation

## 2013-08-04 DIAGNOSIS — E663 Overweight: Secondary | ICD-10-CM | POA: Diagnosis not present

## 2013-08-04 DIAGNOSIS — M25569 Pain in unspecified knee: Secondary | ICD-10-CM | POA: Diagnosis not present

## 2013-08-04 DIAGNOSIS — IMO0001 Reserved for inherently not codable concepts without codable children: Secondary | ICD-10-CM | POA: Insufficient documentation

## 2013-08-04 DIAGNOSIS — R3915 Urgency of urination: Secondary | ICD-10-CM | POA: Insufficient documentation

## 2013-08-04 DIAGNOSIS — E291 Testicular hypofunction: Secondary | ICD-10-CM | POA: Insufficient documentation

## 2013-08-04 DIAGNOSIS — R3589 Other polyuria: Secondary | ICD-10-CM | POA: Insufficient documentation

## 2013-08-15 ENCOUNTER — Encounter: Payer: Self-pay | Admitting: Family Medicine

## 2013-08-15 ENCOUNTER — Ambulatory Visit (INDEPENDENT_AMBULATORY_CARE_PROVIDER_SITE_OTHER): Payer: Medicare Other | Admitting: Family Medicine

## 2013-08-15 VITALS — BP 120/64 | HR 89 | Temp 98.3°F | Ht 67.75 in | Wt 198.5 lb

## 2013-08-15 DIAGNOSIS — M353 Polymyalgia rheumatica: Secondary | ICD-10-CM | POA: Diagnosis not present

## 2013-08-15 DIAGNOSIS — R9389 Abnormal findings on diagnostic imaging of other specified body structures: Secondary | ICD-10-CM | POA: Insufficient documentation

## 2013-08-15 MED ORDER — PREDNISONE 20 MG PO TABS: ORAL_TABLET | ORAL | Status: DC

## 2013-08-15 NOTE — Patient Instructions (Signed)
Good to see you. Please call my cell phone number- [phone] Polymyalgia Rheumatica Polymyalgia rheumatica (also called PMR or polymyalgia) is a rheumatologic (arthritic) condition that causes pain and morning stiffness in your neck, shoulders, and hips. It is an inflammatory condition. In some people, inflammation of certain structures in the shoulder, hips, or other joints can be seen on special testing. It does not cause joint destruction, as occurs in other arthritic conditions. It usually occurs after 69 years of age, and is more common as you age. It can be confused with several other diseases, but it is usually easily treated. People with PMR often have, or can develop, a more severe rheumatologic condition called giant cell arteritis (also called CGA or temporal arteritis).  CAUSES  The exact cause of PMR is not known.   There are genetic factors involved.  Viruses have been suspected in the cause of PMR. This has not been proven. SYMPTOMS   Aching, pain, and morning stiffness your neck, both shoulders, or both hips.  Symptoms usually start slowly and build gradually.  Morning stiffness usually lasts at least 30 minutes.  Swelling and tenderness in other joints of the arms, hands, legs, and feet may occur.  Swelling and inflammation in the wrists can cause nerve inflammation at the wrist (carpal tunnel syndrome).  You may also have low grade fever, fatigue, weakness, decreased appetite and weight loss. DIAGNOSIS   Your caregiver may suspect that you have PMR based on your description of your symptoms and on your exam.  Your caregiver will examine you to be sure you do not have diseases that can be confused with PMR. These diseases include rheumatoid arthritis, fibromyalgia, or thyroid disease.  Your caregiver should check for signs of giant cell arteritis. This can cause serious complications such as blindness.  Lab tests can help confirm that you have PMR and not other  diseases, but are sometimes inconclusive.  X-rays cannot show PMR. However, it can identify other diseases like rheumatoid arthritis. Your caregiver may have you see a specialist in arthritis and inflammatory diseases (rheumatologist). TREATMENT  The goal of treatment is relief of symptoms. Treatment does not shorten the course of the illness or prevent complications. With proper treatment, you usually feel better almost right away.   The initial treatment of PMR is usually a cortisone (steroid) medication. Your caregiver will help determine a starting dose. The dose is gradually reduced every few weeks to months. Treatment usually lasts one to three years.  Other stronger medications are rarely needed. They will only be prescribed if your symptoms do not get better on cortisone medication alone, or if they recur as the dose is reduced.  Cortisone medication can have different side effects. With the doses of cortisone needed for PMR, the side effects can affect bones and joints, blood sugar control in diabetes, and mood changes. Discuss this with your caregiver.  Your caregiver will evaluate you regularly during your treatment. They will do this in order to assess progress and to check for complications of the illness or treatment.  Physical therapy is sometimes useful. This is especially true if your joints are still stiff after other symptoms have improved. HOME CARE INSTRUCTIONS   Follow your caregiver's instructions. Do not change your dose of cortisone medication on your own.  Keep your appointments for follow-up lab tests and caregiver visits. Your lab tests need to be monitored. You must get checked periodically for giant cell arteritis.  Follow your caregiver's guidance regarding physical activity (  usually no restrictions are needed) or physical therapy.  Your caregiver may have instructions to prevent or check for side effects from cortisone medication (including bone density testing  or treatment). Follow their instructions carefully. SEEK MEDICAL CARE IF:   You develop any side effects from treatment. Side effects can include:  Elevated blood pressure.  High blood sugar (or worsening of diabetes, if you are diabetic).  Difficulty fighting off infections.  Weight gain.  Weakness of the bones (osteoporosis).  Your aches, pains, morning stiffness, or other symptoms get worse with time. This is especially true after your dose of cortisone is reduced.  You develop new joint symptoms (pain, swelling, etc.) SEEK IMMEDIATE MEDICAL CARE IF:   You develop a severe headache.  You start vomiting.  You have problems with your vision.  You have an oral temperature above 102 F (38.9 C), not controlled by medicine. Document Released: 03/16/2004 Document Revised: 01/24/2012 Document Reviewed: 06/29/2008 [facility] Patient Information 2015 [place], [place]. This information is not intended to replace advice given to you by your health care provider. Make sure you discuss any questions you have with your health care provider.

## 2013-08-15 NOTE — Assessment & Plan Note (Signed)
Concern for embolism given his history. Will schedule urgent doppler of carotids and will likely need a neck CT as well. The patient indicates understanding of these issues and agrees with the plan.

## 2013-08-15 NOTE — Progress Notes (Signed)
Subjective:   Patient ID: Eric Rice, male    DOB: 11-28-1945, 68 y.o.   MRN: [id number]  Eric Rice is a pleasant 68 y.o. year old male who presents to clinic today with Knee Pain and Elbow Pain  on 08/15/2013  HPI: 68 yo pleasant male with previous h/o PE here for two concerns:  1.  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 He is standardized pt at [facility].  Was told as they were practicing carotid dopplers that he had "a mass in his artery."  This was over a month ago! He has had no pain or dizziness or blurred vision.  2.  polymyagias- known h/o fibromyalgia but this feels different.  Knees, elbows, hips shoulders achy.  No temple pain or blurred vision. Ibuprofen not helping. Tramadol and narcotics have never helped.  Current Outpatient Prescriptions on File Prior to Visit  Medication Sig Dispense Refill  . aspirin 81 MG tablet Take 81 mg by mouth at bedtime.       . calcium carbonate (OS-CAL) 600 MG TABS Take 600 mg by mouth every morning.       . cholecalciferol (VITAMIN D) 1000 UNITS tablet Take 1,000 Units by mouth every morning.       . Multiple Vitamins-Minerals (CENTRUM SILVER PO) Take by mouth.        Marland Kitchen PARoxetine (PAXIL) 20 MG tablet Take 20 mg by mouth every morning.        . simvastatin (ZOCOR) 40 MG tablet Take 40 mg by mouth at bedtime.        . [DISCONTINUED] testosterone (ANDROGEL) 50 MG/5GM GEL Place 5 g onto the skin daily.         No current facility-administered medications on file prior to visit.    No Known Allergies  Past Medical History  Diagnosis Date  . Fibromyalgia   . Chest pain     neg adenosine myoview in 2008  . Arthritis   . Hyperlipidemia   . Skin cancer     No past surgical history on file.  Family History  Problem Relation Age of Onset  . Heart disease Brother 26    MI  . Heart disease Mother   . Heart disease Father     History   Social History  . Marital Status: Married    Spouse Name: N/A    Number of Children: N/A  .  Years of Education: N/A   Occupational History  . Not on file.   Social History Main Topics  . Smoking status: Never Smoker   . Smokeless tobacco: Never Used  . Alcohol Use: No  . Drug Use: No  . Sexual Activity: Not on file   Other Topics Concern  . Not on file   Social History Narrative   Retired from [employer].   Married to [place], no kids- one step son.   The PMH, PSH, Social History, Family History, Medications, and allergies have been reviewed in [facility], and have been updated if relevant.   Review of Systems See HPI    Objective:    BP 120/64  Pulse 89  Temp(Src) 98.3 F (36.8 C) (Oral)  Ht 5' 7.75" (1.721 m)  Wt 198 lb 8 oz (90.039 kg)  BMI 30.40 kg/m2  SpO2 97%   Physical Exam  Gen: alert, pleasant, NAD HEENT:  No carotid bruit, right does feel much more full on palpation than left MSK:  FROM of joints, does have some crepitus in knees  bilaterally      Assessment & Plan:   Abnormal ultrasound of carotid artery - Plan: US Carotid Duplex Bilateral  Polymyalgia - Plan: Sedimentation Rate Return in about 2 weeks (around 08/29/2013) for follow up.

## 2013-08-15 NOTE — Progress Notes (Signed)
Pre visit review using our clinic review tool, if applicable. No additional management support is needed unless otherwise documented below in the visit note. 

## 2013-08-15 NOTE — Assessment & Plan Note (Signed)
Check SED rate- ? PMR. Will start 1 week course of oral prednisone as well. Advised not to take with NSAIDs. Follow up in 2 weeks.

## 2013-08-18 ENCOUNTER — Other Ambulatory Visit (INDEPENDENT_AMBULATORY_CARE_PROVIDER_SITE_OTHER): Payer: Medicare Other

## 2013-08-18 DIAGNOSIS — M353 Polymyalgia rheumatica: Secondary | ICD-10-CM | POA: Diagnosis not present

## 2013-08-18 DIAGNOSIS — R9389 Abnormal findings on diagnostic imaging of other specified body structures: Secondary | ICD-10-CM | POA: Diagnosis not present

## 2013-08-18 LAB — SEDIMENTATION RATE: Sed Rate: 9 mm/hr (ref 0–22)

## 2013-08-18 NOTE — Addendum Note (Signed)
Addended by: Ellamae Sia on: 08/18/2013 08:45 AM   Modules accepted: Orders

## 2013-08-19 ENCOUNTER — Other Ambulatory Visit (HOSPITAL_COMMUNITY): Payer: Self-pay | Admitting: *Deleted

## 2013-08-19 DIAGNOSIS — R0989 Other specified symptoms and signs involving the circulatory and respiratory systems: Secondary | ICD-10-CM

## 2013-08-20 ENCOUNTER — Ambulatory Visit (HOSPITAL_COMMUNITY): Payer: Medicare Other | Attending: Internal Medicine | Admitting: *Deleted

## 2013-08-20 DIAGNOSIS — E785 Hyperlipidemia, unspecified: Secondary | ICD-10-CM | POA: Insufficient documentation

## 2013-08-20 DIAGNOSIS — I6529 Occlusion and stenosis of unspecified carotid artery: Secondary | ICD-10-CM

## 2013-08-20 DIAGNOSIS — R0989 Other specified symptoms and signs involving the circulatory and respiratory systems: Secondary | ICD-10-CM

## 2013-08-20 NOTE — Progress Notes (Signed)
Carotid duplex complete 

## 2013-08-25 ENCOUNTER — Other Ambulatory Visit: Payer: Self-pay | Admitting: Family Medicine

## 2013-08-25 DIAGNOSIS — E079 Disorder of thyroid, unspecified: Secondary | ICD-10-CM

## 2013-08-27 ENCOUNTER — Other Ambulatory Visit: Payer: Medicare Other

## 2013-08-28 ENCOUNTER — Ambulatory Visit
Admission: RE | Admit: 2013-08-28 | Discharge: 2013-08-28 | Disposition: A | Discharge status: Home / Self Care | Payer: Medicare Other | Source: Ambulatory Visit | Attending: Family Medicine | Admitting: Family Medicine

## 2013-08-28 DIAGNOSIS — E042 Nontoxic multinodular goiter: Secondary | ICD-10-CM | POA: Diagnosis not present

## 2013-08-28 DIAGNOSIS — E079 Disorder of thyroid, unspecified: Secondary | ICD-10-CM

## 2013-09-01 ENCOUNTER — Encounter: Payer: Self-pay | Admitting: Family Medicine

## 2013-09-01 ENCOUNTER — Ambulatory Visit (INDEPENDENT_AMBULATORY_CARE_PROVIDER_SITE_OTHER): Payer: Medicare Other | Admitting: Family Medicine

## 2013-09-01 VITALS — BP 112/76 | HR 78 | Temp 98.5°F | Wt 204.2 lb

## 2013-09-01 DIAGNOSIS — R350 Frequency of micturition: Secondary | ICD-10-CM | POA: Diagnosis not present

## 2013-09-01 LAB — POCT URINALYSIS DIPSTICK
Bilirubin, UA: NEGATIVE
Blood, UA: NEGATIVE
Glucose, UA: NEGATIVE
Ketones, UA: NEGATIVE
Leukocytes, UA: NEGATIVE
Nitrite, UA: NEGATIVE
Spec Grav, UA: 1.025
Urobilinogen, UA: 0.2
pH, UA: 6

## 2013-09-01 MED ORDER — CIPROFLOXACIN HCL 500 MG PO TABS: 500.0000 mg | ORAL_TABLET | Freq: Two times a day (BID) | ORAL | Status: DC

## 2013-09-01 NOTE — Progress Notes (Signed)
Subjective:   Patient ID: Eric Rice, male    DOB: [birth year], 68 y.o.   MRN: [id number]  Eric Rice is a pleasant 68 y.o. year old male who presents to clinic today with Follow-up  on 09/01/2013  HPI: Increased urinary frequency for past month or so. Waking up 3 or 4 times a night. Does have a h/o hematuria- h/o neg work up through urology (including cystoscopy) years ago.  Ultrasound of thyroid neg.  Lab Results  Component Value Date   PSA 0.55 11/19/2012   PSA 0.53 11/27/2011   PSA 0.76 11/28/2010   Current Outpatient Prescriptions on File Prior to Visit  Medication Sig Dispense Refill  . aspirin 81 MG tablet Take 81 mg by mouth at bedtime.       . calcium carbonate (OS-CAL) 600 MG TABS Take 600 mg by mouth every morning.       . cholecalciferol (VITAMIN D) 1000 UNITS tablet Take 1,000 Units by mouth every morning.       Marland Kitchen ibuprofen (ADVIL,MOTRIN) 800 MG tablet Take 800 mg by mouth every 8 (eight) hours as needed.      . Multiple Vitamins-Minerals (CENTRUM SILVER PO) Take by mouth.        Marland Kitchen PARoxetine (PAXIL) 20 MG tablet Take 20 mg by mouth every morning.        . simvastatin (ZOCOR) 40 MG tablet Take 40 mg by mouth at bedtime.        . [DISCONTINUED] testosterone (ANDROGEL) 50 MG/5GM GEL Place 5 g onto the skin daily.         No current facility-administered medications on file prior to visit.    No Known Allergies  Past Medical History  Diagnosis Date  . Fibromyalgia   . Chest pain     neg adenosine myoview in 2008  . Arthritis   . Hyperlipidemia   . Skin cancer     No past surgical history on file.  Family History  Problem Relation Age of Onset  . Heart disease Brother 53    MI  . Heart disease Mother   . Heart disease Father     History   Social History  . Marital Status: Married    Spouse Name: N/A    Number of Children: N/A  . Years of Education: N/A   Occupational History  . Not on file.   Social History Main Topics  . Smoking status:  Never Smoker   . Smokeless tobacco: Never Used  . Alcohol Use: No  . Drug Use: No  . Sexual Activity: Not on file   Other Topics Concern  . Not on file   Social History Narrative   Retired from [employer].   Married to Eric Rice, no kids- one step son.   The PMH, PSH, Social History, Family History, Medications, and allergies have been reviewed in [facility], and have been updated if relevant.     Review of Systems    See HPI No back pain No fever No penile discharge No n/v Objective:    BP 112/76  Pulse 78  Temp(Src) 98.5 F (36.9 C) (Oral)  Wt 204 lb 4 oz (92.647 kg)   Physical Exam  Nursing note and vitals reviewed. Constitutional: He appears well-developed and well-nourished. No distress.  Abdominal: Soft. Bowel sounds are normal. He exhibits no distension and no mass. There is no tenderness. There is no rebound and no guarding.  Skin: Skin is warm and dry.  Psychiatric: He has  a normal mood and affect. His behavior is normal.          Assessment & Plan:   Increased urinary frequency No Follow-up on file.

## 2013-09-01 NOTE — Progress Notes (Signed)
Pre visit review using our clinic review tool, if applicable. No additional management support is needed unless otherwise documented below in the visit note. 

## 2013-09-01 NOTE — Assessment & Plan Note (Signed)
UA neg. ?prostatitis although not classical presentation. Will defer prostate exam today due to this. Treat pro phylactically with cipro if symptoms do not resolve or worsen He will update me on his symptoms.

## 2013-09-01 NOTE — Patient Instructions (Signed)
Good to see you. Please keep me updated with your symptoms. Take cipro as directed.

## 2013-09-02 LAB — URINE CULTURE
Colony Count: NO GROWTH
Organism ID, Bacteria: NO GROWTH

## 2013-09-12 IMAGING — CT CT ABD-PELV W/ CM
2 of 5 series · 17 of 46 positions shown, 19 images · IV contrast (Omnipaque 300)
Comparison: CT abdomen 03/04/2004, CT chest 11/28/2011

CLINICAL DATA: Severe growing pain.  History of recent pulmonary
embolism.

CT ABDOMEN AND PELVIS WITH CONTRAST
TECHNIQUE: Multidetector CT imaging of the abdomen and pelvis was
performed following the standard protocol during bolus
administration of intravenous contrast.
Contrast: 100mL OMNIPAQUE IOHEXOL 300 MG/ML  SOLN

[Series 2: abd/ pel 5mm · axial · 0.72mm/px · z∈[-486,-72]mm · 14 of 93 slices shown, 16 images]
[im 5/93  soft-tissue]
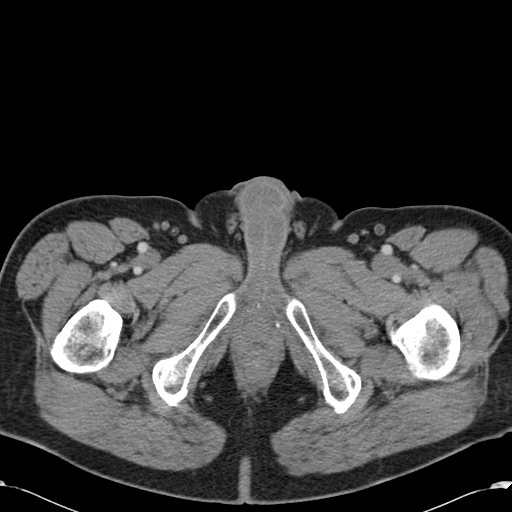
[im 5/93  bone]
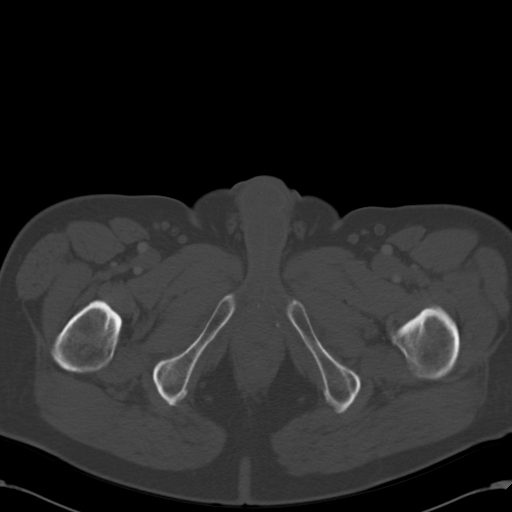
[im 14/93  soft-tissue]
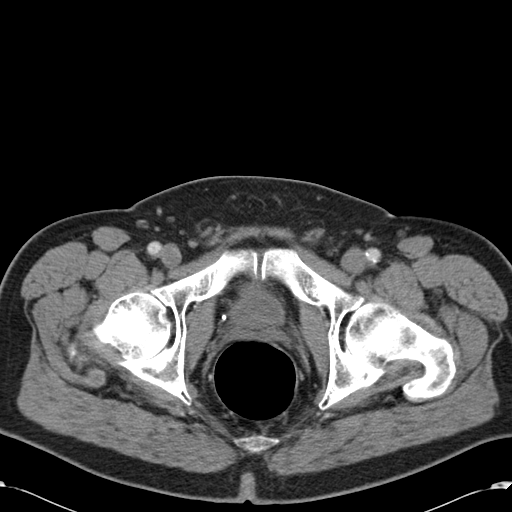
[im 19/93  soft-tissue]
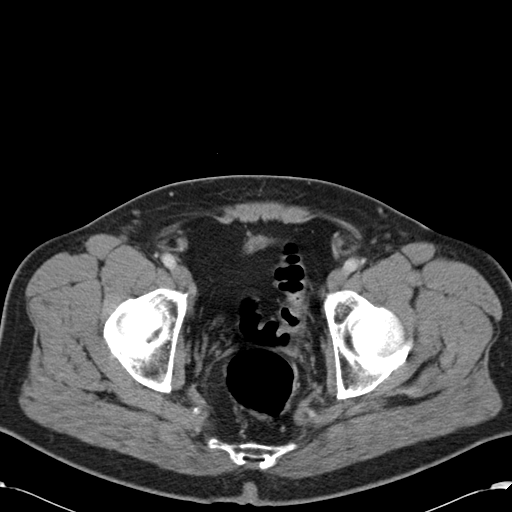
[im 24/93  soft-tissue]
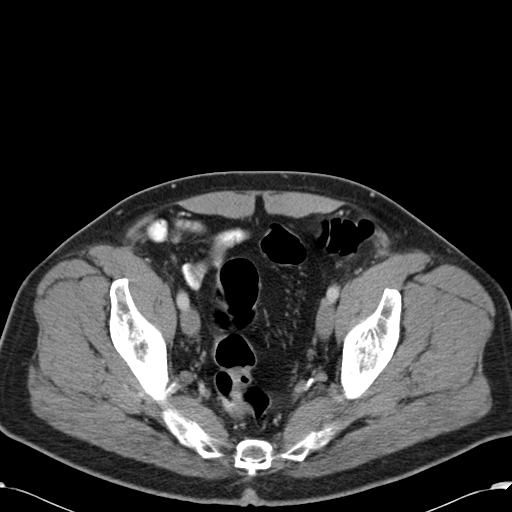
[im 33/93  soft-tissue]
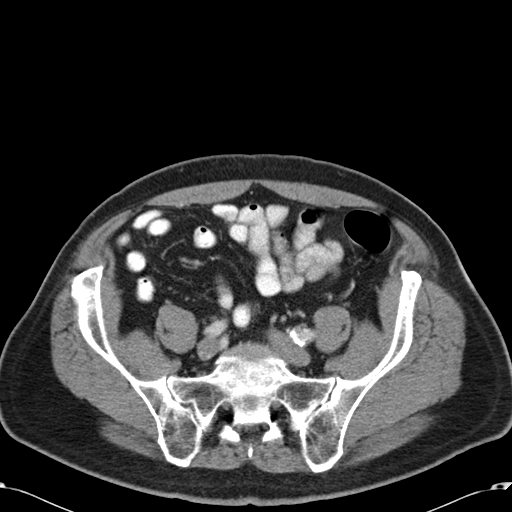
[im 37/93  soft-tissue]
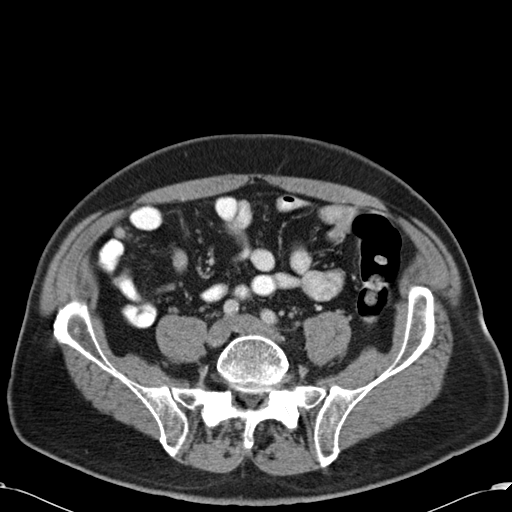
[im 42/93  soft-tissue]
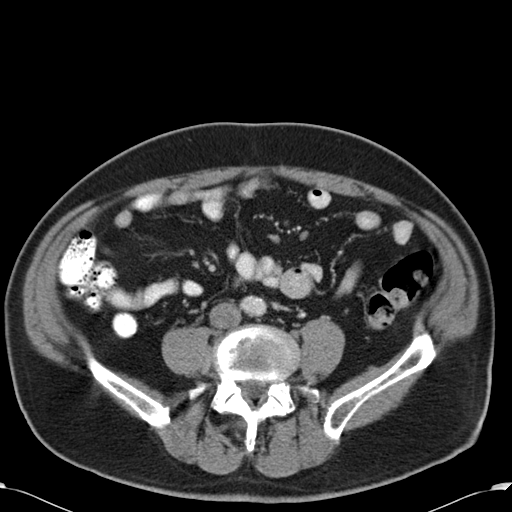
[im 51/93  soft-tissue]
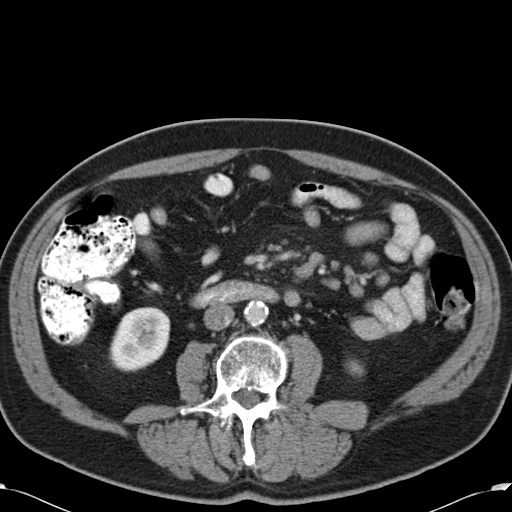
[im 56/93  soft-tissue]
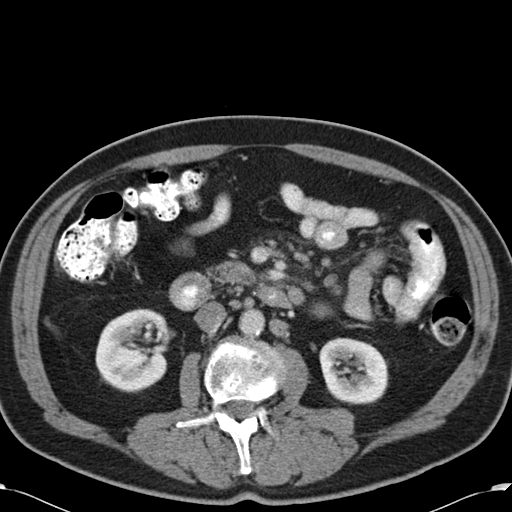
[im 56/93  bone]
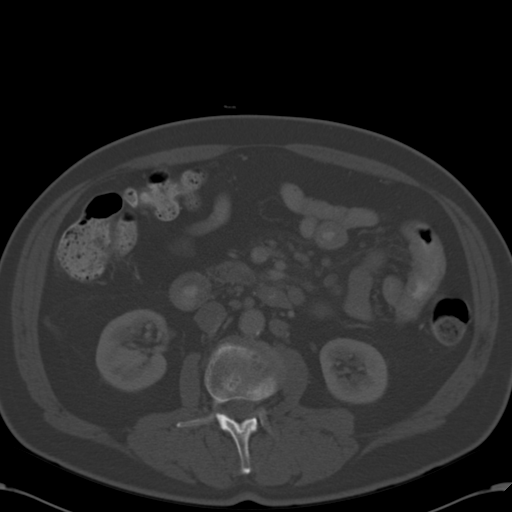
[im 60/93  soft-tissue]
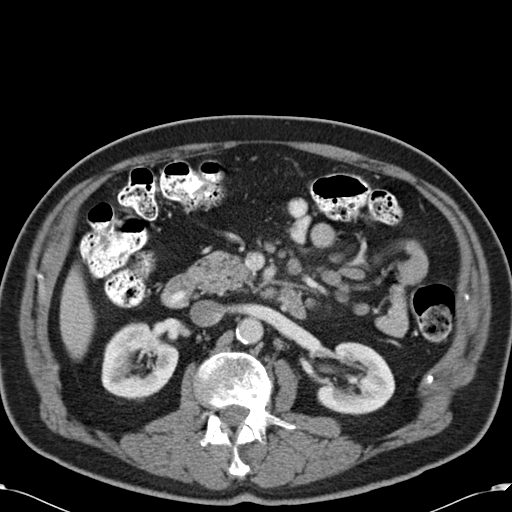
[im 70/93  soft-tissue]
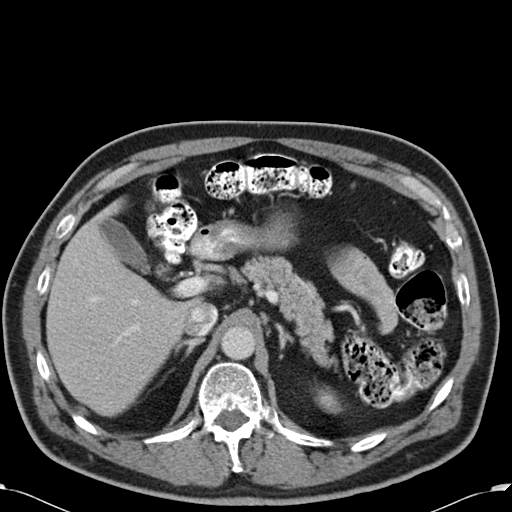
[im 74/93  soft-tissue]
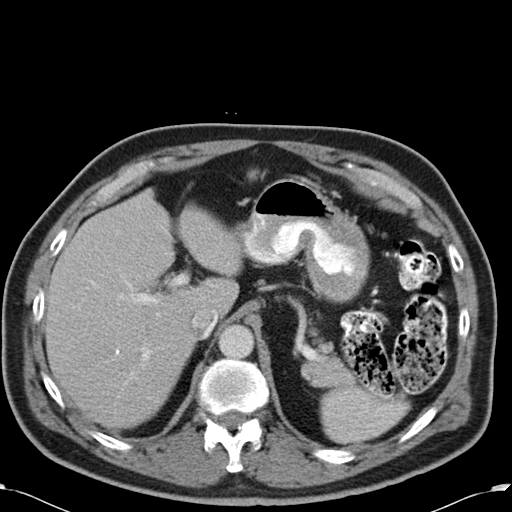
[im 79/93  soft-tissue]
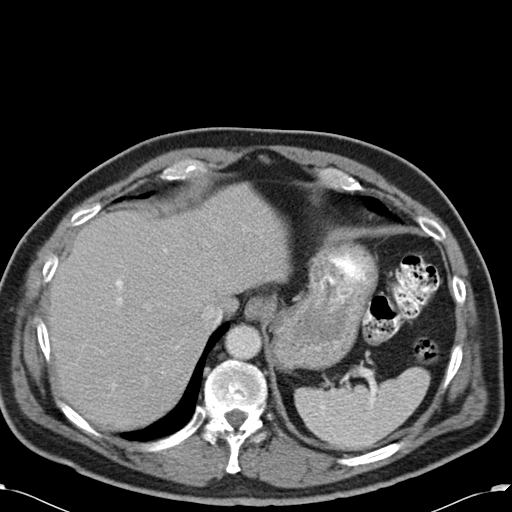
[im 88/93  soft-tissue]
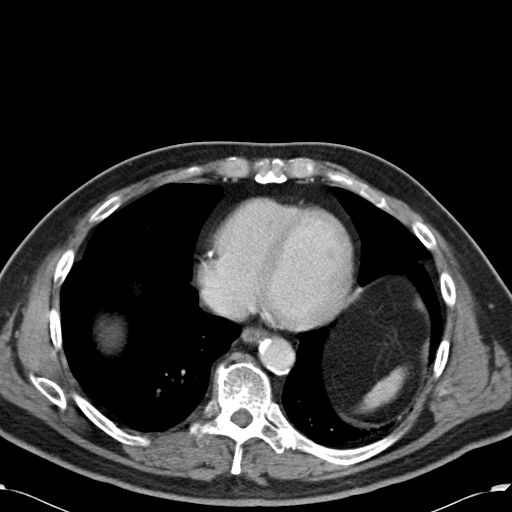

[Series 602: cor · coronal · 0.94mm/px · 3 of 125 slices shown]
[im 42/125  soft-tissue]
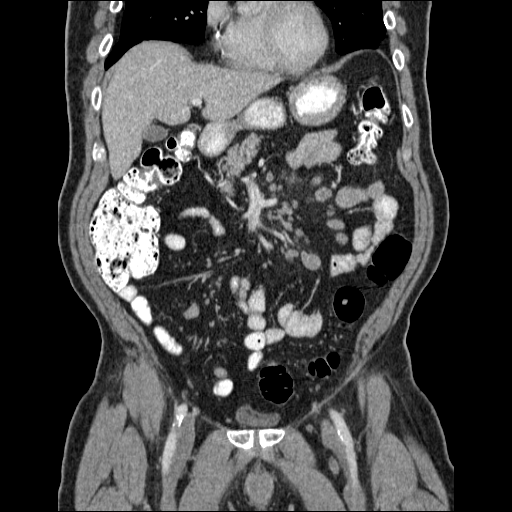
[im 56/125  soft-tissue]
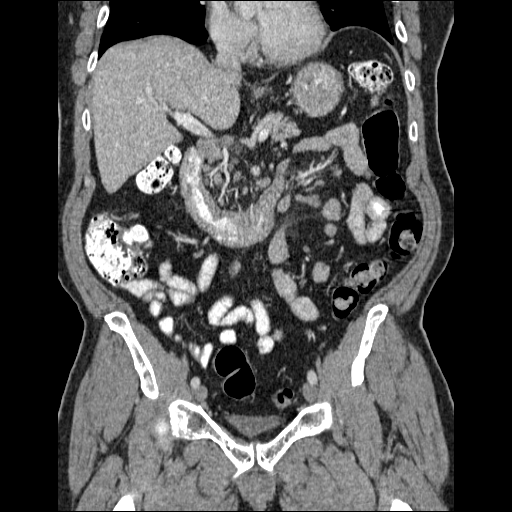
[im 69/125  soft-tissue]
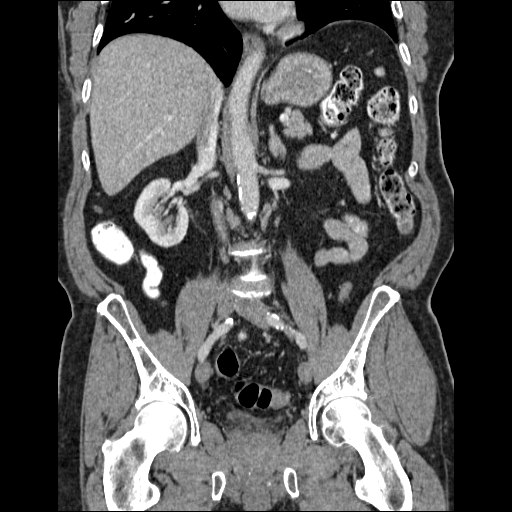

[17 of 46 positions shown; findings below may reference images not displayed]

FINDINGS: Lung bases are clear.  No pericardial fluid.  No focal
hepatic lesion.  Gallbladder, pancreas, spleen, adrenal glands, and
kidneys are normal.

The stomach, small bowel, appendix, and colon are normal.

Abdominal aorta normal caliber.  Small retroperitoneal periaortic
lymph nodes which are less than 10 mm.  There is a cluster of
mildly enlarged lymph nodes in the central mesentery.  Examples
nodes measure up to 11 mm (image 32).  12 mm node on image 44 for
example in the left abdomen. These nodes are numerous numbering
approximately 30 and are new from prior.

In the pelvis, the prostate gland bladder normal.  There is no
significant pelvic lymphadenopathy.  No significant inguinal
hernias.

Review bone windows demonstrates endplate degenerative change.  No
aggressive osseous lesions.
IMPRESSION: Prominent mildly enlarged lymph nodes within the central mesentery.
Differential includes a lymphoproliferative process (such as
lymphoma or CLL) versus sclerosing adenitis.  Findings are new from
prior.  Recommend hematological workup as patient had a recent
pulmonary embolism.

## 2013-09-18 DIAGNOSIS — M25569 Pain in unspecified knee: Secondary | ICD-10-CM | POA: Diagnosis not present

## 2013-09-18 DIAGNOSIS — M1710 Unilateral primary osteoarthritis, unspecified knee: Secondary | ICD-10-CM | POA: Diagnosis not present

## 2013-10-28 DIAGNOSIS — D1801 Hemangioma of skin and subcutaneous tissue: Secondary | ICD-10-CM | POA: Diagnosis not present

## 2013-10-28 DIAGNOSIS — L723 Sebaceous cyst: Secondary | ICD-10-CM | POA: Diagnosis not present

## 2013-10-28 DIAGNOSIS — Z85828 Personal history of other malignant neoplasm of skin: Secondary | ICD-10-CM | POA: Diagnosis not present

## 2013-10-28 DIAGNOSIS — L919 Hypertrophic disorder of the skin, unspecified: Secondary | ICD-10-CM | POA: Diagnosis not present

## 2013-10-28 DIAGNOSIS — L909 Atrophic disorder of skin, unspecified: Secondary | ICD-10-CM | POA: Diagnosis not present

## 2013-10-28 DIAGNOSIS — L821 Other seborrheic keratosis: Secondary | ICD-10-CM | POA: Diagnosis not present

## 2013-12-31 ENCOUNTER — Telehealth: Payer: Self-pay | Admitting: Family Medicine

## 2013-12-31 NOTE — Telephone Encounter (Signed)
OK to put him on the schedule at 1 pm but tell him he can come in between 11 and 1 pm.

## 2013-12-31 NOTE — Telephone Encounter (Signed)
Patient scheduled appointment for tomorrow at 1:00 and said he'll be here closer to 11:00.

## 2013-12-31 NOTE — Telephone Encounter (Signed)
Patient has been dizzy,cough,congestion,shortness of breath.  Patient said he spoke to you last time about not being able to get an appointment with you and you gave him your phone number, but he lost the phone number.  Patient wants to know if you can see him after 11:00 tomorrow.  Patient said if he's not home, leave a detailed message on his answering machine with the time of the appointment.

## 2014-01-01 ENCOUNTER — Encounter: Payer: Self-pay | Admitting: Family Medicine

## 2014-01-01 ENCOUNTER — Ambulatory Visit (INDEPENDENT_AMBULATORY_CARE_PROVIDER_SITE_OTHER): Payer: Medicare Other | Admitting: Family Medicine

## 2014-01-01 ENCOUNTER — Ambulatory Visit (INDEPENDENT_AMBULATORY_CARE_PROVIDER_SITE_OTHER)
Admission: RE | Admit: 2014-01-01 | Discharge: 2014-01-01 | Disposition: A | Discharge status: Home / Self Care | Payer: Medicare Other | Source: Ambulatory Visit | Attending: Family Medicine | Admitting: Family Medicine

## 2014-01-01 VITALS — BP 118/60 | HR 71 | Temp 97.7°F | Wt 204.2 lb

## 2014-01-01 DIAGNOSIS — Z23 Encounter for immunization: Secondary | ICD-10-CM | POA: Diagnosis not present

## 2014-01-01 DIAGNOSIS — R42 Dizziness and giddiness: Secondary | ICD-10-CM | POA: Diagnosis not present

## 2014-01-01 DIAGNOSIS — R05 Cough: Secondary | ICD-10-CM

## 2014-01-01 DIAGNOSIS — Z79899 Other long term (current) drug therapy: Secondary | ICD-10-CM

## 2014-01-01 DIAGNOSIS — Z86711 Personal history of pulmonary embolism: Secondary | ICD-10-CM

## 2014-01-01 DIAGNOSIS — R059 Cough, unspecified: Secondary | ICD-10-CM

## 2014-01-01 LAB — CBC WITH DIFFERENTIAL/PLATELET
Basophils Absolute: 0 10*3/uL (ref 0.0–0.1)
Basophils Relative: 0.4 % (ref 0.0–3.0)
Eosinophils Absolute: 0.3 10*3/uL (ref 0.0–0.7)
Eosinophils Relative: 4.5 % (ref 0.0–5.0)
HCT: 43.2 % (ref 39.0–52.0)
HEMOGLOBIN: 14.3 g/dL (ref 13.0–17.0)
LYMPHS ABS: 1.6 10*3/uL (ref 0.7–4.0)
LYMPHS PCT: 22.5 % (ref 12.0–46.0)
MCHC: 33.1 g/dL (ref 30.0–36.0)
MCV: 87.5 fl (ref 78.0–100.0)
MONOS PCT: 9 % (ref 3.0–12.0)
Monocytes Absolute: 0.6 10*3/uL (ref 0.1–1.0)
Neutro Abs: 4.6 10*3/uL (ref 1.4–7.7)
Neutrophils Relative %: 63.6 % (ref 43.0–77.0)
PLATELETS: 157 10*3/uL (ref 150.0–400.0)
RBC: 4.93 Mil/uL (ref 4.22–5.81)
RDW: 13.9 % (ref 11.5–15.5)
WBC: 7.2 10*3/uL (ref 4.0–10.5)

## 2014-01-01 LAB — TSH: TSH: 2.81 u[IU]/mL (ref 0.35–4.50)

## 2014-01-01 NOTE — Progress Notes (Signed)
Subjective:    Patient ID: Eric Rice, male    DOB: Oct 28, 1945, 68 y.o.   MRN: [id number]  HPI  Very pleasant 68 yo with h/o PE here for 1 month of recurrent cough.  Cough is dry, intermittent.  Can get so bad that he becomes dizzy after one of his coughing spells. Does feel SOB when coughing, maybe a little more than usual with exertion. No CP No fevers or night sweats. Does have h/o allergic rhinitis but has never had these symptoms with his allergies in past. No h/o GERD. Does not take an ACEI.  Weight stable. Wt Readings from Last 3 Encounters:  01/01/14 204 lb 4 oz (92.647 kg)  09/01/13 204 lb 4 oz (92.647 kg)  08/15/13 198 lb 8 oz (90.039 kg)     Current Outpatient Prescriptions on File Prior to Visit  Medication Sig Dispense Refill  . aspirin 81 MG tablet Take 81 mg by mouth at bedtime.     . calcium carbonate (OS-CAL) 600 MG TABS Take 600 mg by mouth every morning.     . cholecalciferol (VITAMIN D) 1000 UNITS tablet Take 1,000 Units by mouth every morning.     . Multiple Vitamins-Minerals (CENTRUM SILVER PO) Take by mouth.      Marland Kitchen PARoxetine (PAXIL) 20 MG tablet Take 20 mg by mouth every morning.      . simvastatin (ZOCOR) 40 MG tablet Take 40 mg by mouth at bedtime.      . [DISCONTINUED] testosterone (ANDROGEL) 50 MG/5GM GEL Place 5 g onto the skin daily.       No current facility-administered medications on file prior to visit.    No Known Allergies  Past Medical History  Diagnosis Date  . Fibromyalgia   . Chest pain     neg adenosine myoview in 2008  . Arthritis   . Hyperlipidemia   . Skin cancer     No past surgical history on file.  Family History  Problem Relation Age of Onset  . Heart disease Brother 66    MI  . Heart disease Mother   . Heart disease Father     History   Social History  . Marital Status: Married    Spouse Name: N/A    Number of Children: N/A  . Years of Education: N/A   Occupational History  . Not on file.   Social  History Main Topics  . Smoking status: Never Smoker   . Smokeless tobacco: Never Used  . Alcohol Use: No  . Drug Use: No  . Sexual Activity: Not on file   Other Topics Concern  . Not on file   Social History Narrative   Retired from [employer].   Married to [place], no kids- one step son.   The PMH, PSH, Social History, Family History, Medications, and allergies have been reviewed in [facility], and have been updated if relevant.  Review of Systems  Constitutional: Negative.   HENT: Negative.   Respiratory: Positive for cough and shortness of breath. Negative for choking, chest tightness, wheezing and stridor.   Cardiovascular: Negative.  Negative for palpitations and leg swelling.  All other systems reviewed and are negative.      Objective:   Physical Exam  Constitutional: He is oriented to person, place, and time. He appears well-developed and well-nourished. No distress.  HENT:  Head: Normocephalic.  Eyes: Pupils are equal, round, and reactive to light.  Cardiovascular: Normal rate, regular rhythm and normal heart sounds.  Pulmonary/Chest: Effort normal and breath sounds normal. No respiratory distress. He has no wheezes. He has no rales. He exhibits no tenderness.  Neurological: He is alert and oriented to person, place, and time.  Skin: Skin is warm and dry.  Psychiatric: He has a normal mood and affect. His behavior is normal. Judgment and thought content normal.  Nursing note and vitals reviewed.         Assessment & Plan:

## 2014-01-01 NOTE — Assessment & Plan Note (Signed)
Persistent with unclear etiology at this point- wide differential Dx. Lung exam reassuring. Given duration of symptoms, will get CXR today. Also given history, check D dimer, CBC. Could be allergic- advised starting an antihistamine. Does not seem GERD but certainly cannot rule that out.

## 2014-01-01 NOTE — Patient Instructions (Signed)
Great to see you. We will call you with your xray and lab results.  I do suggest you start Allegra or Claritin- ok to stop if not helping in a week or so.

## 2014-01-01 NOTE — Progress Notes (Signed)
Pre visit review using our clinic review tool, if applicable. No additional management support is needed unless otherwise documented below in the visit note. 

## 2014-01-02 ENCOUNTER — Telehealth: Payer: Self-pay

## 2014-01-02 ENCOUNTER — Other Ambulatory Visit: Payer: Self-pay | Admitting: Family Medicine

## 2014-01-02 ENCOUNTER — Ambulatory Visit (INDEPENDENT_AMBULATORY_CARE_PROVIDER_SITE_OTHER)
Admission: RE | Admit: 2014-01-02 | Discharge: 2014-01-02 | Disposition: A | Discharge status: Home / Self Care | Payer: Medicare Other | Source: Ambulatory Visit | Attending: Cardiovascular Disease | Admitting: Cardiovascular Disease

## 2014-01-02 DIAGNOSIS — R0602 Shortness of breath: Secondary | ICD-10-CM | POA: Diagnosis not present

## 2014-01-02 DIAGNOSIS — R05 Cough: Secondary | ICD-10-CM | POA: Diagnosis not present

## 2014-01-02 DIAGNOSIS — Z86711 Personal history of pulmonary embolism: Secondary | ICD-10-CM

## 2014-01-02 DIAGNOSIS — R791 Abnormal coagulation profile: Secondary | ICD-10-CM | POA: Diagnosis not present

## 2014-01-02 DIAGNOSIS — R918 Other nonspecific abnormal finding of lung field: Secondary | ICD-10-CM | POA: Diagnosis not present

## 2014-01-02 DIAGNOSIS — R7989 Other specified abnormal findings of blood chemistry: Secondary | ICD-10-CM

## 2014-01-02 LAB — BUN: BUN: 31 mg/dL — ABNORMAL HIGH (ref 6–23)

## 2014-01-02 LAB — CREATININE, SERUM: Creatinine, Ser: 1.1 mg/dL (ref 0.4–1.5)

## 2014-01-02 LAB — D-DIMER, QUANTITATIVE: D-Dimer, Quant: 1.07 ug/mL-FEU — ABNORMAL HIGH (ref 0.00–0.48)

## 2014-01-02 MED ORDER — IOHEXOL 350 MG/ML SOLN
100.0000 mL | Freq: Once | INTRAVENOUS | Status: AC | PRN
  Administered 2014-01-02: 100 mL via INTRAVENOUS

## 2014-01-02 NOTE — Addendum Note (Signed)
Addended by: Marchia Bond on: 01/02/2014 02:32 PM   Modules accepted: Orders

## 2014-01-02 NOTE — Telephone Encounter (Signed)
Lattie Haw called report with CT of chest; report is in pts chart but pt is waiting. Spoke with Dr Deborra Medina and she said tell pt no PE found which is good news and pt can go home. Lattie Haw said she would tell pt and if pt had further questions would have pt call office on 01/05/14.

## 2014-01-05 ENCOUNTER — Other Ambulatory Visit: Payer: Self-pay | Admitting: Family Medicine

## 2014-01-05 DIAGNOSIS — R351 Nocturia: Secondary | ICD-10-CM

## 2014-01-05 DIAGNOSIS — R0609 Other forms of dyspnea: Principal | ICD-10-CM

## 2014-01-05 DIAGNOSIS — I2510 Atherosclerotic heart disease of native coronary artery without angina pectoris: Secondary | ICD-10-CM

## 2014-01-08 ENCOUNTER — Ambulatory Visit (INDEPENDENT_AMBULATORY_CARE_PROVIDER_SITE_OTHER): Payer: Medicare Other | Admitting: Cardiovascular Disease

## 2014-01-08 ENCOUNTER — Encounter: Payer: Self-pay | Admitting: Cardiovascular Disease

## 2014-01-08 VITALS — BP 108/74 | HR 81 | Ht 67.75 in | Wt 203.0 lb

## 2014-01-08 DIAGNOSIS — R0602 Shortness of breath: Secondary | ICD-10-CM

## 2014-01-08 DIAGNOSIS — E785 Hyperlipidemia, unspecified: Secondary | ICD-10-CM

## 2014-01-08 DIAGNOSIS — I25118 Atherosclerotic heart disease of native coronary artery with other forms of angina pectoris: Secondary | ICD-10-CM

## 2014-01-08 DIAGNOSIS — I2510 Atherosclerotic heart disease of native coronary artery without angina pectoris: Secondary | ICD-10-CM

## 2014-01-08 DIAGNOSIS — R42 Dizziness and giddiness: Secondary | ICD-10-CM | POA: Diagnosis not present

## 2014-01-08 NOTE — Assessment & Plan Note (Signed)
The patient gets his labs done at the [place]. I recommend a target LDL of less than 100 and preferably less than 70.

## 2014-01-08 NOTE — Assessment & Plan Note (Signed)
The patient was found recently to have coronary calcifications noted on CTA of the chest which establishes the diagnosis of coronary atherosclerosis. His symptoms currently include exertional dyspnea without chest discomfort. I think we have to exclude the possibility of obstructive coronary artery disease. Thus, I requested a pharmacologic nuclear stress test. He is not able to exercise on a treadmill due to fibromyalgia. I discussed with the patient the importance of lifestyle changes in order to decrease the chance of future coronary artery disease and cardiovascular events. We discussed the importance of controlling risk factors, healthy diet as well as regular exercise. I also explained to him that the CT scan already establishes the diagnosis of atherosclerosis. Thus, aggressive treatment of risk factors is recommended regardless of the results of stress test.

## 2014-01-08 NOTE — Progress Notes (Signed)
Primary care physician: Dr. Deborra Medina  HPI  This is a pleasant 68 year old male who was referred for evaluation of exertional dyspnea and abnormal CT scan which showed coronary calcifications. The patient has no previous cardiac history. He has known history of hyperlipidemia, fibromyalgia and strong family history of premature coronary artery disease. His identical twin had myocardial infarction in his 83s and ultimately underwent CABG. His father died at the age of 56 of myocardial infarction. The patient had recent episodes of dry cough which actually has resolved. However, over the last month he has experienced significant exertional dyspnea and fatigue with normal activities such as going up 1 flight of stairs. This has not been associated with chest discomfort. He denies orthopnea, PND or lower extremity edema. Due to his recent cough and shortness of breath, he had labs done which showed elevated d-dimer. He underwent CTA of the chest which showed no evidence of pulmonary embolism. However, he was found to have calcifications in the LAD and left circumflex distribution. He is a lifelong nonsmoker.   No Known Allergies   Current Outpatient Prescriptions on File Prior to Visit  Medication Sig Dispense Refill  . aspirin 81 MG tablet Take 81 mg by mouth at bedtime.     . calcium carbonate (OS-CAL) 600 MG TABS Take 600 mg by mouth every morning.     . cholecalciferol (VITAMIN D) 1000 UNITS tablet Take 1,000 Units by mouth every morning.     . Multiple Vitamins-Minerals (CENTRUM SILVER PO) Take by mouth.      Marland Kitchen PARoxetine (PAXIL) 20 MG tablet Take 20 mg by mouth every morning.      . simvastatin (ZOCOR) 40 MG tablet Take 40 mg by mouth at bedtime.      . [DISCONTINUED] testosterone (ANDROGEL) 50 MG/5GM GEL Place 5 g onto the skin daily.       No current facility-administered medications on file prior to visit.     Past Medical History  Diagnosis Date  . Fibromyalgia   . Chest pain     neg  adenosine myoview in 2008  . Arthritis   . Hyperlipidemia   . Skin cancer      No past surgical history on file.   Family History  Problem Relation Age of Onset  . Heart disease Brother 76    MI  . Heart disease Mother   . Heart disease Father      History   Social History  . Marital Status: Married    Spouse Name: N/A    Number of Children: N/A  . Years of Education: N/A   Occupational History  . Not on file.   Social History Main Topics  . Smoking status: Never Smoker   . Smokeless tobacco: Never Used  . Alcohol Use: No  . Drug Use: No  . Sexual Activity: Not on file   Other Topics Concern  . Not on file   Social History Narrative   Retired from [employer].   Married to [place], no kids- one step son.     ROS A 10 point review of system was performed. It is negative other than that mentioned in the history of present illness.   PHYSICAL EXAM   BP 108/74 mmHg  Pulse 81  Ht 5' 7.75" (1.721 m)  Wt 203 lb (92.08 kg)  BMI 31.09 kg/m2 Constitutional: He is oriented to person, place, and time. He appears well-developed and well-nourished. No distress.  HENT: No nasal discharge.  Head: Normocephalic and  atraumatic.  Eyes: Pupils are equal and round.  No discharge. Neck: Normal range of motion. Neck supple. No JVD present. No thyromegaly present.  Cardiovascular: Normal rate, regular rhythm, normal heart sounds. Exam reveals no gallop and no friction rub. No murmur heard.  Pulmonary/Chest: Effort normal and breath sounds normal. No stridor. No respiratory distress. He has no wheezes. He has no rales. He exhibits no tenderness.  Abdominal: Soft. Bowel sounds are normal. He exhibits no distension. There is no tenderness. There is no rebound and no guarding.  Musculoskeletal: Normal range of motion. He exhibits no edema and no tenderness.  Neurological: He is alert and oriented to person, place, and time. Coordination normal.  Skin: Skin is warm and dry. No rash  noted. He is not diaphoretic. No erythema. No pallor.  Psychiatric: He has a normal mood and affect. His behavior is normal. Judgment and thought content normal.       EKG: Normal sinus rhythm with no significant ST or T wave changes.   ASSESSMENT AND PLAN

## 2014-01-08 NOTE — Patient Instructions (Addendum)
MYOVIEW at our [facility] in [place] caregiver has ordered a Stress Test with nuclear imaging. The purpose of this test is to evaluate the blood supply to your heart muscle. This procedure is referred to as a "Non-Invasive Stress Test." This is because other than having an IV started in your vein, nothing is inserted or "invades" your body. Cardiac stress tests are done to find areas of poor blood flow to the heart by determining the extent of coronary artery disease (CAD). Some patients exercise on a treadmill, which naturally increases the blood flow to your heart, while others who are  unable to walk on a treadmill due to physical limitations have a pharmacologic/chemical stress agent called Lexiscan . This medicine will mimic walking on a treadmill by temporarily increasing your coronary blood flow.   Please note: these test may take anywhere between 2-4 hours to complete    Date of Procedure:______11/23/15_______________________________  Arrival Time for Procedure:______11:45 am ________________________  Instructions regarding medication:     PLEASE NOTIFY THE OFFICE AT LEAST 24 HOURS IN ADVANCE IF YOU ARE UNABLE TO KEEP YOUR APPOINTMENT.  [phone]   How to prepare for your Myoview test:  1. Do not eat or drink after midnight 2. No caffeine for 24 hours prior to test 3. No smoking 24 hours prior to test. 4. Your medication may be taken with water.  If your doctor stopped a medication because of this test, do not take that medication. 5. Ladies, please do not wear dresses.  Skirts or pants are appropriate. Please wear a short sleeve shirt. 6. No perfume, cologne or lotion. 7. Wear comfortable walking shoes. No heels!  Your physician recommends that you schedule a follow-up appointment in:  As needed with Dr. Fletcher Anon

## 2014-01-12 ENCOUNTER — Ambulatory Visit (HOSPITAL_COMMUNITY): Payer: Medicare Other | Attending: Cardiology | Admitting: Radiology

## 2014-01-12 DIAGNOSIS — R0609 Other forms of dyspnea: Secondary | ICD-10-CM | POA: Diagnosis not present

## 2014-01-12 DIAGNOSIS — R0602 Shortness of breath: Secondary | ICD-10-CM | POA: Diagnosis not present

## 2014-01-12 DIAGNOSIS — R5383 Other fatigue: Secondary | ICD-10-CM | POA: Diagnosis not present

## 2014-01-12 DIAGNOSIS — E785 Hyperlipidemia, unspecified: Secondary | ICD-10-CM | POA: Diagnosis not present

## 2014-01-12 DIAGNOSIS — Z8249 Family history of ischemic heart disease and other diseases of the circulatory system: Secondary | ICD-10-CM | POA: Diagnosis not present

## 2014-01-12 MED ORDER — REGADENOSON 0.4 MG/5ML IV SOLN
0.4000 mg | Freq: Once | INTRAVENOUS | Status: AC
  Administered 2014-01-12: 0.4 mg via INTRAVENOUS

## 2014-01-12 MED ORDER — TECHNETIUM TC 99M SESTAMIBI GENERIC - CARDIOLITE
30.0000 | Freq: Once | INTRAVENOUS | Status: AC | PRN
  Administered 2014-01-12: 30 via INTRAVENOUS

## 2014-01-12 MED ORDER — TECHNETIUM TC 99M SESTAMIBI GENERIC - CARDIOLITE
10.0000 | Freq: Once | INTRAVENOUS | Status: AC | PRN
  Administered 2014-01-12: 10 via INTRAVENOUS

## 2014-01-12 NOTE — Progress Notes (Signed)
[facility] [address] [place], [place] [address] [phone]    Cardiology Nuclear Med Study  Eric Rice is a 68 y.o. male     MRN : [id number]     DOB: 18-Jul-1945  Procedure Date: 01/12/2014  Nuclear Med Background Indication for Stress Test:  Evaluation for Ischemia, and 01-02-2014 CT Chest: Coronary Atherosclerosis LAD/LCFX Coronary Arteries History:  + coronary calcifications per chest CT Cardiac Risk Factors: Carotid Disease, Family History - CAD and Lipids  Symptoms:  DOE and Fatigue   Nuclear Pre-Procedure Caffeine/Decaff Intake:  None> 12 hrs NPO After: 10:30pm   Lungs:  clear O2 Sat: 98% on room air. IV 0.9% NS with Angio Cath:  22g  IV Site: R Antecubital x 1, tolerated well IV Started by:  Irven Baltimore, RN  Chest Size (in):  42 Cup Size: n/a  Height: 5' 7.75" (1.721 m)  Weight:  200 lb (90.719 kg)  BMI:  Body mass index is 30.63 kg/(m^2). Tech Comments:  N/A    Nuclear Med Study 1 or 2 day study: 1 day  Stress Test Type:  Treadmill/Lexiscan  Reading MD: N/A  Order Authorizing Provider:  Kathlyn Sacramento, MD  Resting Radionuclide: Technetium 22m Sestamibi  Resting Radionuclide Dose: 11.0 mCi   Stress Radionuclide:  Technetium 75m Sestamibi  Stress Radionuclide Dose: 33.0 mCi           Stress Protocol Rest HR: 68 Stress HR: 118  Rest BP: 144/94 Stress BP: 109/87  Exercise Time (min): n/a METS: n/a   Predicted Max HR: 151 bpm % Max HR: 78.15 bpm Rate Pressure Product: 16756   Dose of Adenosine (mg):  n/a Dose of Lexiscan: 0.4 mg  Dose of Atropine (mg): n/a Dose of Dobutamine: n/a mcg/kg/min (at max HR)  Stress Test Technologist: Glade Lloyd, BS-ES  Nuclear Technologist:  Vedia Pereyra, CNMT     Rest Procedure:  Myocardial perfusion imaging was performed at rest 45 minutes following the intravenous administration of Technetium 64m Sestamibi. Rest ECG: Normal sinus rhythm. Normal EKG  Stress Procedure:  The patient  received IV Lexiscan 0.4 mg over 15-seconds with concurrent low level exercise and then Technetium 69m Sestamibi was injected at 30-seconds while the patient continued walking one more minute.  Quantitative spect images were obtained after a 45-minute delay.  During the infusion of Lexiscan the patient complained of feeling funny and being slightly lightheaded.  These symptoms began to resolve in recovery.  Stress ECG: No significant change from baseline ECG  QPS Raw Data Images:  Normal; no motion artifact; normal heart/lung ratio. Stress Images:  Slight decreased activity in the inferior segments consistent with diaphragmatic attenuation Rest Images:  Rest images of the same as the stress images. Subtraction (SDS):  No evidence of ischemia. Transient Ischemic Dilatation (Normal <1.22):  0.84 Lung/Heart Ratio (Normal <0.45):  0.34  Quantitative Gated Spect Images QGS EDV:  91 ml QGS ESV:  38 ml  Impression Exercise Capacity:  Lexiscan with low level exercise. BP Response:  Normal blood pressure response. Clinical Symptoms:  Lightheaded ECG Impression:  No significant ST segment change suggestive of ischemia. Comparison with Prior Nuclear Study: No images to compare  Overall Impression:  Normal stress nuclear study. There is no scar or ischemia. This is a low risk scan.  LV Ejection Fraction: 58%.  LV Wall Motion:  Normal Wall Motion.  Dola Argyle , MD

## 2014-02-17 ENCOUNTER — Telehealth: Payer: Self-pay | Admitting: Cardiovascular Disease

## 2014-02-17 NOTE — Telephone Encounter (Signed)
Follow Up  Per previous message. Nurse spoke with wife. Eric Rice results were faxed. Eric Rice came home said that mailing the results was not what he needed. Wife requests a call back to speak with the pt himself so that he can discuss what is needed personally.

## 2014-02-17 NOTE — Telephone Encounter (Signed)
I spoke with the pt's wife and she said the pt would like a copy of myoview mailed to his home for his records. Myoview mailed.

## 2014-02-17 NOTE — Telephone Encounter (Signed)
I spoke with the pt and he wanted a detailed explanation of myoview results. I made the pt aware of results and will mail myoview results to pt's home.

## 2014-02-17 NOTE — Telephone Encounter (Signed)
New Prob   Pt has some questions regarding last myocardial perfusion. Please call.

## 2014-04-09 DIAGNOSIS — K4090 Unilateral inguinal hernia, without obstruction or gangrene, not specified as recurrent: Secondary | ICD-10-CM | POA: Diagnosis not present

## 2014-04-09 DIAGNOSIS — N418 Other inflammatory diseases of prostate: Secondary | ICD-10-CM | POA: Diagnosis not present

## 2014-05-28 DIAGNOSIS — L918 Other hypertrophic disorders of the skin: Secondary | ICD-10-CM | POA: Diagnosis not present

## 2014-05-28 DIAGNOSIS — L821 Other seborrheic keratosis: Secondary | ICD-10-CM | POA: Diagnosis not present

## 2014-05-28 DIAGNOSIS — D225 Melanocytic nevi of trunk: Secondary | ICD-10-CM | POA: Diagnosis not present

## 2014-05-28 DIAGNOSIS — Z411 Encounter for cosmetic surgery: Secondary | ICD-10-CM | POA: Diagnosis not present

## 2014-05-28 DIAGNOSIS — Z8589 Personal history of malignant neoplasm of other organs and systems: Secondary | ICD-10-CM | POA: Diagnosis not present

## 2014-05-28 DIAGNOSIS — L570 Actinic keratosis: Secondary | ICD-10-CM | POA: Diagnosis not present

## 2014-05-28 DIAGNOSIS — L309 Dermatitis, unspecified: Secondary | ICD-10-CM | POA: Diagnosis not present

## 2014-06-16 DIAGNOSIS — R1030 Lower abdominal pain, unspecified: Secondary | ICD-10-CM | POA: Diagnosis not present

## 2014-06-16 DIAGNOSIS — Z125 Encounter for screening for malignant neoplasm of prostate: Secondary | ICD-10-CM | POA: Diagnosis not present

## 2014-06-16 DIAGNOSIS — R310 Gross hematuria: Secondary | ICD-10-CM | POA: Diagnosis not present

## 2014-06-16 DIAGNOSIS — G8929 Other chronic pain: Secondary | ICD-10-CM | POA: Diagnosis not present

## 2014-07-23 ENCOUNTER — Encounter: Payer: Self-pay | Admitting: Gastroenterology

## 2014-07-23 ENCOUNTER — Telehealth: Payer: Self-pay | Admitting: Family Medicine

## 2014-07-23 NOTE — Telephone Encounter (Signed)
Pt is requesting a copy of his immunizations. He is specifically requesting to find out if he has been given the pnemonia vaccine.  He went to the [place] clinic today and they asked him if he had received it.  Please call on the home number and leave a message if he does not answer, thanks.

## 2014-07-23 NOTE — Telephone Encounter (Signed)
Lm on pts vm and informed him that he has not received pneumonia immunization; no other on file outside of flu

## 2014-08-31 ENCOUNTER — Telehealth: Payer: Self-pay | Admitting: Family Medicine

## 2014-08-31 DIAGNOSIS — E042 Nontoxic multinodular goiter: Secondary | ICD-10-CM

## 2014-08-31 NOTE — Telephone Encounter (Signed)
Patient notified as instructed by telephone and verbalized understanding. 

## 2014-08-31 NOTE — Telephone Encounter (Signed)
Please call pt to let him know that he is due for his follow up thyroid ultrasound.  I have placed the referral and someone should be calling him soon.

## 2014-09-08 ENCOUNTER — Ambulatory Visit
Admission: RE | Admit: 2014-09-08 | Discharge: 2014-09-08 | Disposition: A | Discharge status: Home / Self Care | Payer: Medicare Other | Source: Ambulatory Visit | Attending: Family Medicine | Admitting: Family Medicine

## 2014-09-08 DIAGNOSIS — E042 Nontoxic multinodular goiter: Secondary | ICD-10-CM

## 2014-09-14 ENCOUNTER — Ambulatory Visit (INDEPENDENT_AMBULATORY_CARE_PROVIDER_SITE_OTHER)
Admission: RE | Admit: 2014-09-14 | Discharge: 2014-09-14 | Disposition: A | Discharge status: Home / Self Care | Payer: Medicare Other | Source: Ambulatory Visit | Attending: Family Medicine | Admitting: Family Medicine

## 2014-09-14 ENCOUNTER — Encounter: Payer: Self-pay | Admitting: Family Medicine

## 2014-09-14 ENCOUNTER — Ambulatory Visit (INDEPENDENT_AMBULATORY_CARE_PROVIDER_SITE_OTHER): Payer: Medicare Other | Admitting: Family Medicine

## 2014-09-14 VITALS — BP 118/72 | HR 90 | Temp 98.2°F | Wt 203.2 lb

## 2014-09-14 DIAGNOSIS — R590 Localized enlarged lymph nodes: Secondary | ICD-10-CM

## 2014-09-14 DIAGNOSIS — R0602 Shortness of breath: Secondary | ICD-10-CM

## 2014-09-14 DIAGNOSIS — Z79899 Other long term (current) drug therapy: Secondary | ICD-10-CM | POA: Diagnosis not present

## 2014-09-14 DIAGNOSIS — R300 Dysuria: Secondary | ICD-10-CM | POA: Insufficient documentation

## 2014-09-14 DIAGNOSIS — Z125 Encounter for screening for malignant neoplasm of prostate: Secondary | ICD-10-CM | POA: Diagnosis not present

## 2014-09-14 DIAGNOSIS — I2699 Other pulmonary embolism without acute cor pulmonale: Secondary | ICD-10-CM

## 2014-09-14 DIAGNOSIS — R350 Frequency of micturition: Secondary | ICD-10-CM

## 2014-09-14 DIAGNOSIS — R5381 Other malaise: Secondary | ICD-10-CM

## 2014-09-14 DIAGNOSIS — R0609 Other forms of dyspnea: Secondary | ICD-10-CM

## 2014-09-14 MED ORDER — CIPROFLOXACIN HCL 500 MG PO TABS: 500.0000 mg | ORAL_TABLET | Freq: Two times a day (BID) | ORAL | Status: DC

## 2014-09-14 NOTE — Assessment & Plan Note (Signed)
Deteriorated, ongoing issue. Strongly advised that he allow me to refer him to urology or see urologist at [facility].  DRE exam reassuring.  UA neg- also reassuring. Check PSA. Start course of cipro and send urine for cx to treat possible prostatitis/uti. He will think about urology referral and get back to me. The patient indicates understanding of these issues and agrees with the plan.

## 2014-09-14 NOTE — Assessment & Plan Note (Signed)
Deteriorated.  Likely multifactorial and fibromyalgia and low testosterone likely playing a roll.  See above.

## 2014-09-14 NOTE — Progress Notes (Signed)
Pre visit review using our clinic review tool, if applicable. No additional management support is needed unless otherwise documented below in the visit note. 

## 2014-09-14 NOTE — Assessment & Plan Note (Signed)
Intermittent.  Referred to cardiology last fall for this- neg myoperfusion scan. Likely multifactorial.  Check labs including D dimer given PMH of PE, CXR today.

## 2014-09-14 NOTE — Progress Notes (Signed)
Subjective:    Patient ID: Eric Rice, male    DOB: [birth year], 69 y.o.   MRN: [id number]  HPI  Very pleasant 69 yo male here to discuss a few issues.  Nocturia with increased day time urinary frequency as well. No hematuria but does report intermittent dysuria.    No FH of prostate CA. This has been a chronic complaint for years, intermittently. UAs have been neg and have treated him for prostatitis in past.  Followed by [facility] and has had PSA done there but we do not have these results.  Lab Results  Component Value Date   PSA 0.55 11/19/2012   PSA 0.53 11/27/2011   PSA 0.76 11/28/2010   Fatigue-   Also a chronic intermittent issue.  He is used to having fatigue with his fibomyalgia but lately more short of breath with exertion as well.  Saw cardiology for these symptoms last year.  Neg myocardial perfusion scan on 01/12/14- reviewed. No CP.  He was on testosterone years ago but developed PEs while taking testosterone and has subsequently stopped taking it.  Lab Results  Component Value Date   WBC 7.2 01/01/2014   HGB 14.3 01/01/2014   HCT 43.2 01/01/2014   MCV 87.5 01/01/2014   PLT 157.0 01/01/2014   Lab Results  Component Value Date   TSH 2.81 01/01/2014   Lab Results  Component Value Date   TESTOSTERONE 608.26 10/18/2009    Patient Active Problem List   Diagnosis Date Noted  . Dysuria 09/14/2014  . Hyperlipidemia 01/08/2014  . DOE (dyspnea on exertion) 01/05/2014  . Coronary atherosclerosis of native coronary artery 01/05/2014  . Cough 01/01/2014  . Dizziness and giddiness 01/01/2014  . Abnormal ultrasound of carotid artery 08/15/2013  . Polymyalgia 08/15/2013  . Left groin pain 04/14/2013  . Increased urinary frequency 11/19/2012  . Nocturia 11/19/2012  . Fibromyalgia 11/19/2012  . 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 Lymphadenopathy, abdominal 04/15/2012  . Acute pulmonary embolism 11/30/2011  . Right groin pain 11/30/2011  . Malaise 11/27/2011  .  Testicular mass 11/28/2010  . Long term use of drug 11/28/2010  . DIVERTICULAR DISEASE 03/09/2010   Past Medical History  Diagnosis Date  . Fibromyalgia   . Chest pain     neg adenosine myoview in 2008  . Arthritis   . Hyperlipidemia   . Skin cancer    History reviewed. No pertinent past surgical history. History  Substance Use Topics  . Smoking status: Never Smoker   . Smokeless tobacco: Never Used  . Alcohol Use: No   Family History  Problem Relation Age of Onset  . Heart disease Brother 54    MI  . Heart disease Mother   . Heart disease Father    No Known Allergies Current Outpatient Prescriptions on File Prior to Visit  Medication Sig Dispense Refill  . aspirin 81 MG tablet Take 81 mg by mouth at bedtime.     . calcium carbonate (OS-CAL) 600 MG TABS Take 600 mg by mouth every morning.     . cholecalciferol (VITAMIN D) 1000 UNITS tablet Take 1,000 Units by mouth every morning.     . Multiple Vitamins-Minerals (CENTRUM SILVER PO) Take by mouth.      Marland Kitchen PARoxetine (PAXIL) 20 MG tablet Take 20 mg by mouth every morning.      . simvastatin (ZOCOR) 40 MG tablet Take 40 mg by mouth at bedtime.      . [DISCONTINUED] testosterone (ANDROGEL) 50 MG/5GM GEL Place 5  g onto the skin daily.       No current facility-administered medications on file prior to visit.   The PMH, PSH, Social History, Family History, Medications, and allergies have been reviewed in [facility], and have been updated if relevant.   Review of Systems  Constitutional: Positive for fatigue. Negative for fever.  HENT: Negative.   Eyes: Negative.   Respiratory: Positive for shortness of breath. Negative for cough, choking, chest tightness, wheezing and stridor.   Cardiovascular: Negative for chest pain, palpitations and leg swelling.  Endocrine: Negative.   Genitourinary: Positive for dysuria, urgency and frequency. Negative for hematuria and testicular pain.  Allergic/Immunologic: Negative.   Neurological:  Negative.   Hematological: Negative.   Psychiatric/Behavioral: Negative.   All other systems reviewed and are negative.   Objective:   Physical Exam  BP 118/72 mmHg  Pulse 90  Temp(Src) 98.2 F (36.8 C) (Oral)  Wt 203 lb 4 oz (92.194 kg)  SpO2 95% General:  Pleasant male in NAD Eyes:  PERRL Ears:  External ear exam shows no significant lesions or deformities.  Otoscopic examination reveals clear canals, tympanic membranes are intact bilaterally without bulging, retraction, inflammation or discharge. Hearing is grossly normal bilaterally. Nose:  External nasal examination shows no deformity or inflammation. Nasal mucosa are pink and moist without lesions or exudates. Mouth:  Oral mucosa and oropharynx without lesions or exudates.  Teeth in good repair. Neck:  no carotid bruit or thyromegaly no cervical or supraclavicular lymphadenopathy  Lungs:  Normal respiratory effort, chest expands symmetrically. Lungs are clear to auscultation, no crackles or wheezes. Heart:  Normal rate and regular rhythm. S1 and S2 normal without gallop, murmur, click, rub or other extra sounds. Abdomen:  Bowel sounds positive,abdomen soft and non-tender without masses, organomegaly or hernias noted. Prostate:  Prostate gland firm and smooth, 1 plus enlargement, no nodularity, tenderness, mass, asymmetry or induration. Pulses:  R and L posterior tibial pulses are full and equal bilaterally  Extremities:  no edema     Assessment & Plan:

## 2014-09-15 ENCOUNTER — Other Ambulatory Visit: Payer: Self-pay | Admitting: Family Medicine

## 2014-09-15 ENCOUNTER — Telehealth: Payer: Self-pay | Admitting: Family Medicine

## 2014-09-15 DIAGNOSIS — R7989 Other specified abnormal findings of blood chemistry: Secondary | ICD-10-CM

## 2014-09-15 LAB — CBC WITH DIFFERENTIAL/PLATELET
BASOS ABS: 0 10*3/uL (ref 0.0–0.1)
Basophils Relative: 0.1 % (ref 0.0–3.0)
EOS PCT: 3.2 % (ref 0.0–5.0)
Eosinophils Absolute: 0.2 10*3/uL (ref 0.0–0.7)
HCT: 44.3 % (ref 39.0–52.0)
Hemoglobin: 14.9 g/dL (ref 13.0–17.0)
LYMPHS ABS: 1.4 10*3/uL (ref 0.7–4.0)
Lymphocytes Relative: 21.8 % (ref 12.0–46.0)
MCHC: 33.7 g/dL (ref 30.0–36.0)
MCV: 86.1 fl (ref 78.0–100.0)
MONOS PCT: 9.6 % (ref 3.0–12.0)
Monocytes Absolute: 0.6 10*3/uL (ref 0.1–1.0)
Neutro Abs: 4.3 10*3/uL (ref 1.4–7.7)
Neutrophils Relative %: 65.3 % (ref 43.0–77.0)
Platelets: 164 10*3/uL (ref 150.0–400.0)
RBC: 5.15 Mil/uL (ref 4.22–5.81)
RDW: 13.5 % (ref 11.5–15.5)
WBC: 6.5 10*3/uL (ref 4.0–10.5)

## 2014-09-15 LAB — COMPREHENSIVE METABOLIC PANEL
ALT: 24 U/L (ref 0–53)
AST: 21 U/L (ref 0–37)
Albumin: 4.3 g/dL (ref 3.5–5.2)
Alkaline Phosphatase: 54 U/L (ref 39–117)
BILIRUBIN TOTAL: 0.6 mg/dL (ref 0.2–1.2)
BUN: 25 mg/dL — ABNORMAL HIGH (ref 6–23)
CALCIUM: 9.9 mg/dL (ref 8.4–10.5)
CHLORIDE: 104 meq/L (ref 96–112)
CO2: 28 meq/L (ref 19–32)
Creatinine, Ser: 1.06 mg/dL (ref 0.40–1.50)
GFR: 73.45 mL/min (ref >60.00)
GLUCOSE: 102 mg/dL — AB (ref 70–99)
Potassium: 4.5 mEq/L (ref 3.5–5.1)
SODIUM: 139 meq/L (ref 135–145)
Total Protein: 6.9 g/dL (ref 6.0–8.3)

## 2014-09-15 LAB — VITAMIN D 25 HYDROXY (VIT D DEFICIENCY, FRACTURES): VITD: 35.4 ng/mL (ref 30.00–100.00)

## 2014-09-15 LAB — VITAMIN B12: VITAMIN B 12: 651 pg/mL (ref 211–911)

## 2014-09-15 LAB — D-DIMER, QUANTITATIVE (NOT AT ARMC): D DIMER QUANT: 1.05 ug{FEU}/mL — AB (ref 0.00–0.48)

## 2014-09-15 LAB — PSA, MEDICARE: PSA: 0.6 ng/mL (ref 0.10–4.00)

## 2014-09-15 NOTE — Telephone Encounter (Signed)
Have called the patient 3 times and have left messages on his two phone numbers to call me back to get scheduled for his CT Scan. Waiting for patient to return my call.

## 2014-09-16 ENCOUNTER — Ambulatory Visit (INDEPENDENT_AMBULATORY_CARE_PROVIDER_SITE_OTHER)
Admission: RE | Admit: 2014-09-16 | Discharge: 2014-09-16 | Disposition: A | Discharge status: Home / Self Care | Payer: Medicare Other | Source: Ambulatory Visit | Attending: Family Medicine | Admitting: Family Medicine

## 2014-09-16 ENCOUNTER — Telehealth: Payer: Self-pay

## 2014-09-16 DIAGNOSIS — R7989 Other specified abnormal findings of blood chemistry: Secondary | ICD-10-CM

## 2014-09-16 DIAGNOSIS — I700 Atherosclerosis of aorta: Secondary | ICD-10-CM | POA: Diagnosis not present

## 2014-09-16 DIAGNOSIS — R791 Abnormal coagulation profile: Secondary | ICD-10-CM

## 2014-09-16 DIAGNOSIS — I2510 Atherosclerotic heart disease of native coronary artery without angina pectoris: Secondary | ICD-10-CM | POA: Diagnosis not present

## 2014-09-16 NOTE — Telephone Encounter (Signed)
On the report- no PE, does have old nodules that apparently don't need f/u.  Will defer further mgmt to PCP.  Thanks.

## 2014-09-16 NOTE — Telephone Encounter (Signed)
Stacy with [facility] called report for PE study that is in pts chart under imaging tab. Marzetta Board said pt had to get home and [facility] took out IV and advised pt to go home and someone from Dr Elonda Husky office would call pt. Due to guidelines for called report, this report given to Dr Damita Dunnings.

## 2014-09-16 NOTE — Telephone Encounter (Signed)
Spoke with pt and pt informed as instructed from Dr Damita Dunnings. Pt voiced understanding. Printed copy with Dr Josefine Class comments sent for scanning and copy in Dr Hulen Shouts in box.  Pt will wait for phone call from Dr Deborra Medina if anything further needed.

## 2014-12-07 ENCOUNTER — Ambulatory Visit (INDEPENDENT_AMBULATORY_CARE_PROVIDER_SITE_OTHER): Payer: Medicare Other | Admitting: Family Medicine

## 2014-12-07 ENCOUNTER — Encounter: Payer: Self-pay | Admitting: Family Medicine

## 2014-12-07 VITALS — BP 122/68 | HR 89 | Temp 97.5°F | Wt 204.5 lb

## 2014-12-07 DIAGNOSIS — E291 Testicular hypofunction: Secondary | ICD-10-CM

## 2014-12-07 DIAGNOSIS — R5381 Other malaise: Secondary | ICD-10-CM | POA: Diagnosis not present

## 2014-12-07 DIAGNOSIS — R7989 Other specified abnormal findings of blood chemistry: Secondary | ICD-10-CM

## 2014-12-07 LAB — CBC WITH DIFFERENTIAL/PLATELET
Basophils Absolute: 0 10*3/uL (ref 0.0–0.1)
Basophils Relative: 0.4 % (ref 0.0–3.0)
EOS ABS: 0.3 10*3/uL (ref 0.0–0.7)
EOS PCT: 4.1 % (ref 0.0–5.0)
HCT: 43 % (ref 39.0–52.0)
HEMOGLOBIN: 14.6 g/dL (ref 13.0–17.0)
LYMPHS PCT: 23.3 % (ref 12.0–46.0)
Lymphs Abs: 1.8 10*3/uL (ref 0.7–4.0)
MCHC: 34 g/dL (ref 30.0–36.0)
MCV: 86.9 fl (ref 78.0–100.0)
MONO ABS: 0.7 10*3/uL (ref 0.1–1.0)
Monocytes Relative: 9.8 % (ref 3.0–12.0)
Neutro Abs: 4.8 10*3/uL (ref 1.4–7.7)
Neutrophils Relative %: 62.4 % (ref 43.0–77.0)
Platelets: 173 10*3/uL (ref 150.0–400.0)
RBC: 4.95 Mil/uL (ref 4.22–5.81)
RDW: 14 % (ref 11.5–15.5)
WBC: 7.6 10*3/uL (ref 4.0–10.5)

## 2014-12-07 LAB — TSH: TSH: 3.16 u[IU]/mL (ref 0.35–4.50)

## 2014-12-07 NOTE — Progress Notes (Signed)
Pre visit review using our clinic review tool, if applicable. No additional management support is needed unless otherwise documented below in the visit note. 

## 2014-12-07 NOTE — Assessment & Plan Note (Signed)
>  25 minutes spent in face to face time with patient, >50% spent in counselling or coordination of care Persistent issue.  He does want to recheck testosterone and feels it may be worth the risk to restart.  ? Sleep study to rule out OSA but he has been symptomatic. The patient indicates understanding of these issues and agrees with the plan.  Orders Placed This Encounter  Procedures  . Testosterone  . TSH  . CBC with Differential  . Testosterone, free

## 2014-12-07 NOTE — Progress Notes (Signed)
Subjective:    Patient ID: Eric Rice, male    DOB: [birth year], 69 y.o.   MRN: [id number]  HPI  Very pleasant 69 yo male here to discuss a few issues.  Fatigue- This is a chronic intermittent issue.  He is used to having fatigue with his fibomyalgia but lately he could "sleep all day."  Denies feeling depressed.  Feels he is sleeping through the night.  Does not snore.   Saw cardiology for these symptoms last year.  Neg myocardial perfusion scan on 01/12/14- reviewed. No CP.    He was on testosterone years ago but developed PEs while taking testosterone and has subsequently stopped taking it.  Wants testosterone rechecked.  He thinks it is probably very low.    Lab Results  Component Value Date   PSA 0.60 09/14/2014   PSA 0.55 11/19/2012   PSA 0.53 11/27/2011     Lab Results  Component Value Date   WBC 6.5 09/14/2014   HGB 14.9 09/14/2014   HCT 44.3 09/14/2014   MCV 86.1 09/14/2014   PLT 164.0 09/14/2014   Lab Results  Component Value Date   TSH 2.81 01/01/2014   Lab Results  Component Value Date   TESTOSTERONE 608.26 10/18/2009   Lab Results  Component Value Date   [id number] 825 09/14/2014    Patient Active Problem List   Diagnosis Date Noted  . Hyperlipidemia 01/08/2014  . Coronary atherosclerosis of native coronary artery 01/05/2014  . Abnormal ultrasound of carotid artery 08/15/2013  . Polymyalgia ([place]) 08/15/2013  . Nocturia 11/19/2012  . Fibromyalgia 11/19/2012  . 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 Acute pulmonary embolism ([place]) 11/30/2011  . Malaise 11/27/2011  . Long term use of drug 11/28/2010  . DIVERTICULAR DISEASE 03/09/2010   Past Medical History  Diagnosis Date  . Fibromyalgia   . Chest pain     neg adenosine myoview in 2008  . Arthritis   . Hyperlipidemia   . Skin cancer    No past surgical history on file. Social History  Substance Use Topics  . Smoking status: Never Smoker   . Smokeless tobacco: Never Used  . Alcohol Use:  No   Family History  Problem Relation Age of Onset  . Heart disease Brother 40    MI  . Heart disease Mother   . Heart disease Father    No Known Allergies Current Outpatient Prescriptions on File Prior to Visit  Medication Sig Dispense Refill  . aspirin 81 MG tablet Take 81 mg by mouth at bedtime.     . calcium carbonate (OS-CAL) 600 MG TABS Take 600 mg by mouth every morning.     . cholecalciferol (VITAMIN D) 1000 UNITS tablet Take 1,000 Units by mouth every morning.     . Multiple Vitamins-Minerals (CENTRUM SILVER PO) Take by mouth.      Marland Kitchen PARoxetine (PAXIL) 20 MG tablet Take 20 mg by mouth every morning.      . simvastatin (ZOCOR) 40 MG tablet Take 40 mg by mouth at bedtime.      . [DISCONTINUED] testosterone (ANDROGEL) 50 MG/5GM GEL Place 5 g onto the skin daily.       No current facility-administered medications on file prior to visit.   The PMH, PSH, Social History, Family History, Medications, and allergies have been reviewed in [facility], and have been updated if relevant.   Review of Systems  Constitutional: Positive for fatigue. Negative for fever.  HENT: Negative.   Eyes: Negative.   Respiratory:  Positive for shortness of breath. Negative for cough, choking, chest tightness, wheezing and stridor.   Cardiovascular: Negative for chest pain, palpitations and leg swelling.  Endocrine: Negative.   Genitourinary: Negative for dysuria, urgency, frequency, hematuria and testicular pain.  Allergic/Immunologic: Negative.   Neurological: Negative.   Hematological: Negative.   Psychiatric/Behavioral: Negative.   All other systems reviewed and are negative.   Objective:   Physical Exam  BP 122/68 mmHg  Pulse 89  Temp(Src) 97.5 F (36.4 C) (Oral)  Wt 204 lb 8 oz (92.761 kg)  SpO2 95% General:  Pleasant male in NAD Eyes:  PERRL Ears:  External ear exam shows no significant lesions or deformities.  Otoscopic examination reveals clear canals, tympanic membranes are intact  bilaterally without bulging, retraction, inflammation or discharge. Hearing is grossly normal bilaterally. Nose:  External nasal examination shows no deformity or inflammation. Nasal mucosa are pink and moist without lesions or exudates. Mouth:  Oral mucosa and oropharynx without lesions or exudates.  Teeth in good repair. Neck:  no carotid bruit or thyromegaly no cervical or supraclavicular lymphadenopathy  Lungs:  Normal respiratory effort, chest expands symmetrically. Lungs are clear to auscultation, no crackles or wheezes. Heart:  Normal rate and regular rhythm. S1 and S2 normal without gallop, murmur, click, rub or other extra sounds. Abdomen:  Bowel sounds positive,abdomen soft and non-tender without masses, organomegaly or hernias noted. Prostate:  Prostate gland firm and smooth, 1 plus enlargement, no nodularity, tenderness, mass, asymmetry or induration. Pulses:  R and L posterior tibial pulses are full and equal bilaterally  Extremities:  no edema     Assessment & Plan:

## 2014-12-08 DIAGNOSIS — M1711 Unilateral primary osteoarthritis, right knee: Secondary | ICD-10-CM | POA: Diagnosis not present

## 2014-12-08 LAB — TESTOSTERONE, FREE, TOTAL, SHBG
SEX HORMONE BINDING: 13 nmol/L — AB (ref 22–77)
Testosterone, Free: 36.6 pg/mL — ABNORMAL LOW (ref 47.0–244.0)
Testosterone-% Free: 2.9 % (ref 1.6–2.9)
Testosterone: 128 ng/dL — ABNORMAL LOW (ref 300–890)

## 2014-12-23 ENCOUNTER — Encounter: Payer: Self-pay | Admitting: Family Medicine

## 2014-12-23 ENCOUNTER — Ambulatory Visit (INDEPENDENT_AMBULATORY_CARE_PROVIDER_SITE_OTHER): Payer: Medicare Other | Admitting: Family Medicine

## 2014-12-23 VITALS — BP 128/78 | HR 74 | Temp 98.0°F | Wt 201.8 lb

## 2014-12-23 DIAGNOSIS — R5381 Other malaise: Secondary | ICD-10-CM | POA: Diagnosis not present

## 2014-12-23 DIAGNOSIS — E291 Testicular hypofunction: Secondary | ICD-10-CM

## 2014-12-23 DIAGNOSIS — R7989 Other specified abnormal findings of blood chemistry: Secondary | ICD-10-CM

## 2014-12-23 MED ORDER — TESTOSTERONE 20.25 MG/1.25GM (1.62%) TD GEL: TRANSDERMAL | Status: DC

## 2014-12-23 NOTE — Progress Notes (Signed)
Pre visit review using our clinic review tool, if applicable. No additional management support is needed unless otherwise documented below in the visit note. 

## 2014-12-23 NOTE — Assessment & Plan Note (Signed)
>  25 minutes spent in face to face time with patient, >50% spent in counselling or coordination of care He wants to restart testosterone, aware of increased risk of blood clots/PE.  Previous PE was after long [place] trip.  He is aware that he is at risk and getting another blood clot but would still like to restart testosterone.  Follow up in 2 months.

## 2014-12-23 NOTE — Progress Notes (Signed)
Subjective:    Patient ID: Eric Rice, male    DOB: [birth year], 69 y.o.   MRN: [id number]  HPI  Very pleasant 69 yo male here to discuss low testosterone.  Fatigue- This is a chronic intermittent issue. Denies feeling depressed.  Feels he is sleeping through the night. Does not snore.   Saw cardiology for these symptoms last year.  Neg myocardial perfusion scan on 01/12/14- reviewed. No CP.    He was on testosterone years ago but developed PEs while taking testosterone and has subsequently stopped taking it.  We checked his testosterone at the end of last month and it was in fact low.  Lab Results  Component Value Date   TESTOSTERONE 128* 12/07/2014       Lab Results  Component Value Date   PSA 0.60 09/14/2014   PSA 0.55 11/19/2012   PSA 0.53 11/27/2011     Lab Results  Component Value Date   WBC 7.6 12/07/2014   HGB 14.6 12/07/2014   HCT 43.0 12/07/2014   MCV 86.9 12/07/2014   PLT 173.0 12/07/2014   Lab Results  Component Value Date   TSH 3.16 12/07/2014   Lab Results  Component Value Date   TESTOSTERONE 128* 12/07/2014   Lab Results  Component Value Date   [id number] 623 09/14/2014    Patient Active Problem List   Diagnosis Date Noted  . Hyperlipidemia 01/08/2014  . Coronary atherosclerosis of native coronary artery 01/05/2014  . Abnormal ultrasound of carotid artery 08/15/2013  . Polymyalgia ([place]) 08/15/2013  . Nocturia 11/19/2012  . Fibromyalgia 11/19/2012  . 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 Acute pulmonary embolism ([place]) 11/30/2011  . Malaise 11/27/2011  . Long term use of drug 11/28/2010  . DIVERTICULAR DISEASE 03/09/2010   Past Medical History  Diagnosis Date  . Fibromyalgia   . Chest pain     neg adenosine myoview in 2008  . Arthritis   . Hyperlipidemia   . Skin cancer    No past surgical history on file. Social History  Substance Use Topics  . Smoking status: Never Smoker   . Smokeless tobacco: Never Used  . Alcohol Use: No    Family History  Problem Relation Age of Onset  . Heart disease Brother 1    MI  . Heart disease Mother   . Heart disease Father    No Known Allergies Current Outpatient Prescriptions on File Prior to Visit  Medication Sig Dispense Refill  . aspirin 81 MG tablet Take 81 mg by mouth at bedtime.     . calcium carbonate (OS-CAL) 600 MG TABS Take 600 mg by mouth every morning.     . cholecalciferol (VITAMIN D) 1000 UNITS tablet Take 1,000 Units by mouth every morning.     . Multiple Vitamins-Minerals (CENTRUM SILVER PO) Take by mouth.      Marland Kitchen PARoxetine (PAXIL) 20 MG tablet Take 20 mg by mouth every morning.      . simvastatin (ZOCOR) 40 MG tablet Take 40 mg by mouth at bedtime.      . [DISCONTINUED] testosterone (ANDROGEL) 50 MG/5GM GEL Place 5 g onto the skin daily.       No current facility-administered medications on file prior to visit.   The PMH, PSH, Social History, Family History, Medications, and allergies have been reviewed in [facility], and have been updated if relevant.   Review of Systems  Constitutional: Positive for fatigue. Negative for fever.  HENT: Negative.   Eyes: Negative.   Respiratory:  Positive for shortness of breath. Negative for cough, choking, chest tightness, wheezing and stridor.   Cardiovascular: Negative for chest pain, palpitations and leg swelling.  Endocrine: Negative.   Genitourinary: Negative for dysuria, urgency, frequency, hematuria and testicular pain.  Allergic/Immunologic: Negative.   Neurological: Negative.   Hematological: Negative.   Psychiatric/Behavioral: Negative.   All other systems reviewed and are negative.   Objective:   Physical Exam  BP 128/78 mmHg  Pulse 74  Temp(Src) 98 F (36.7 C) (Oral)  Wt 201 lb 12 oz (91.513 kg)  SpO2 97% General:  Pleasant male in NAD Eyes:  PERRL Ears:  External ear exam shows no significant lesions or deformities.  Otoscopic examination reveals clear canals, tympanic membranes are intact  bilaterally without bulging, retraction, inflammation or discharge. Hearing is grossly normal bilaterally. Nose:  External nasal examination shows no deformity or inflammation. Nasal mucosa are pink and moist without lesions or exudates. Mouth:  Oral mucosa and oropharynx without lesions or exudates.  Teeth in good repair. Neck:  no carotid bruit or thyromegaly no cervical or supraclavicular lymphadenopathy  Lungs:  Normal respiratory effort, chest expands symmetrically. Lungs are clear to auscultation, no crackles or wheezes. Heart:  Normal rate and regular rhythm. S1 and S2 normal without gallop, murmur, click, rub or other extra sounds. Abdomen:  Bowel sounds positive,abdomen soft and non-tender without masses, organomegaly or hernias noted. Prostate:  Prostate gland firm and smooth, 1 plus enlargement, no nodularity, tenderness, mass, asymmetry or induration. Pulses:  R and L posterior tibial pulses are full and equal bilaterally  Extremities:  no edema     Assessment & Plan:

## 2014-12-28 DIAGNOSIS — I781 Nevus, non-neoplastic: Secondary | ICD-10-CM | POA: Diagnosis not present

## 2014-12-28 DIAGNOSIS — L821 Other seborrheic keratosis: Secondary | ICD-10-CM | POA: Diagnosis not present

## 2015-02-12 ENCOUNTER — Encounter: Payer: Self-pay | Admitting: Gastroenterology

## 2015-03-17 ENCOUNTER — Encounter: Payer: Self-pay | Admitting: Urology

## 2015-03-18 ENCOUNTER — Ambulatory Visit (INDEPENDENT_AMBULATORY_CARE_PROVIDER_SITE_OTHER): Payer: Medicare Other | Admitting: Obstetrics and Gynecology

## 2015-03-18 ENCOUNTER — Encounter: Payer: Self-pay | Admitting: Obstetrics and Gynecology

## 2015-03-18 VITALS — BP 135/83 | HR 83 | Resp 16 | Ht 69.0 in | Wt 203.1 lb

## 2015-03-18 DIAGNOSIS — R310 Gross hematuria: Secondary | ICD-10-CM | POA: Diagnosis not present

## 2015-03-18 DIAGNOSIS — R102 Pelvic and perineal pain: Secondary | ICD-10-CM | POA: Diagnosis not present

## 2015-03-18 DIAGNOSIS — G8929 Other chronic pain: Secondary | ICD-10-CM | POA: Diagnosis not present

## 2015-03-18 LAB — URINALYSIS, COMPLETE
BILIRUBIN UA: NEGATIVE
Glucose, UA: NEGATIVE
Leukocytes, UA: NEGATIVE
NITRITE UA: NEGATIVE
RBC UA: NEGATIVE
UUROB: 0.2 mg/dL (ref 0.2–1.0)
pH, UA: 5.5 (ref 5.0–7.5)

## 2015-03-18 LAB — MICROSCOPIC EXAMINATION
EPITHELIAL CELLS (NON RENAL): NONE SEEN /HPF (ref 0–10)
RBC MICROSCOPIC, UA: NONE SEEN /HPF (ref 0–?)

## 2015-03-18 NOTE — Patient Instructions (Signed)
Cystoscopy Cystoscopy is a procedure that is used to help your caregiver diagnose and sometimes treat conditions that affect your lower urinary tract. Your lower urinary tract includes your bladder and the tube through which urine passes from your bladder out of your body (urethra). Cystoscopy is performed with a thin, tube-shaped instrument (cystoscope). The cystoscope has lenses and a light at the end so that your caregiver can see inside your bladder. The cystoscope is inserted at the entrance of your urethra. Your caregiver guides it through your urethra and into your bladder. There are two main types of cystoscopy:  Flexible cystoscopy (with a flexible cystoscope).  Rigid cystoscopy (with a rigid cystoscope). Cystoscopy may be recommended for many conditions, including:  Urinary tract infections.  Blood in your urine (hematuria).  Loss of bladder control (urinary incontinence) or overactive bladder.  Unusual cells found in a urine sample.  Urinary blockage.  Painful urination. Cystoscopy may also be done to remove a sample of your tissue to be checked under a microscope (biopsy). It may also be done to remove or destroy bladder stones. LET YOUR CAREGIVER KNOW ABOUT:  Allergies to food or medicine.  Medicines taken, including vitamins, herbs, eyedrops, over-the-counter medicines, and creams.  Use of steroids (by mouth or creams).  Previous problems with anesthetics or numbing medicines.  History of bleeding problems or blood clots.  Previous surgery.  Other health problems, including diabetes and kidney problems.  Possibility of pregnancy, if this applies. PROCEDURE The area around the opening to your urethra will be cleaned. A medicine to numb your urethra (local anesthetic) is used. If a tissue sample or stone is removed during the procedure, you may be given a medicine to make you sleep (general anesthetic). Your caregiver will gently insert the tip of the cystoscope  into your urethra. The cystoscope will be slowly glided through your urethra and into your bladder. Sterile fluid will flow through the cystoscope and into your bladder. The fluid will expand and stretch your bladder. This gives your caregiver a better view of your bladder walls. The procedure lasts about 15-20 minutes. AFTER THE PROCEDURE If a local anesthetic is used, you will be allowed to go home as soon as you are ready. If a general anesthetic is used, you will be taken to a recovery area until you are stable. You may have temporary bleeding and burning on urination.   This information is not intended to replace advice given to you by your health care provider. Make sure you discuss any questions you have with your health care provider.   Document Released: 02/04/2000 Document Revised: 02/27/2014 Document Reviewed: 07/31/2011 Elsevier Interactive Patient Education 2016 Elsevier Inc. Hematuria, Adult Hematuria is blood in your urine. It can be caused by a bladder infection, kidney infection, prostate infection, kidney stone, or cancer of your urinary tract. Infections can usually be treated with medicine, and a kidney stone usually will pass through your urine. If neither of these is the cause of your hematuria, further workup to find out the reason may be needed. It is very important that you tell your health care provider about any blood you see in your urine, even if the blood stops without treatment or happens without causing pain. Blood in your urine that happens and then stops and then happens again can be a symptom of a very serious condition. Also, pain is not a symptom in the initial stages of many urinary cancers. HOME CARE INSTRUCTIONS   Drink lots of fluid, 3-4  quarts a day. If you have been diagnosed with an infection, cranberry juice is especially recommended, in addition to large amounts of water.  Avoid caffeine, tea, and carbonated beverages because they tend to irritate the  bladder.  Avoid alcohol because it may irritate the prostate.  Take all medicines as directed by your health care provider.  If you were prescribed an antibiotic medicine, finish it all even if you start to feel better.  If you have been diagnosed with a kidney stone, follow your health care provider's instructions regarding straining your urine to catch the stone.  Empty your bladder often. Avoid holding urine for long periods of time.  After a bowel movement, women should cleanse front to back. Use each tissue only once.  Empty your bladder before and after sexual intercourse if you are a male. SEEK MEDICAL CARE IF:  You develop back pain.  You have a fever.  You have a feeling of sickness in your stomach (nausea) or vomiting.  Your symptoms are not better in 3 days. Return sooner if you are getting worse. SEEK IMMEDIATE MEDICAL CARE IF:   You develop severe vomiting and are unable to keep the medicine down.  You develop severe back or abdominal pain despite taking your medicines.  You begin passing a large amount of blood or clots in your urine.  You feel extremely weak or faint, or you pass out. MAKE SURE YOU:   Understand these instructions.  Will watch your condition.  Will get help right away if you are not doing well or get worse.   This information is not intended to replace advice given to you by your health care provider. Make sure you discuss any questions you have with your health care provider.   Document Released: 02/06/2005 Document Revised: 02/27/2014 Document Reviewed: 10/07/2012 Elsevier Interactive Patient Education [employer].

## 2015-03-18 NOTE — Progress Notes (Signed)
03/18/2015 1:05 PM   Eric Rice 1945-05-19 [id number]  Referring provider: Lucille Passy, MD Eric Rice, [place] [address]  Chief Complaint  Patient presents with  . Groin Pain    2nd opinion wanted  . Establish Care    HPI: Patient is a 70 year old male presenting today for a second opinion regarding his chronic suprapubic and groin pain as well as recommendations for a cystoscopy for recurrent gross hematuria.  He was most recently seen by a nurse practitioner at [facility] urology. He states that she recommended a cystoscopy for further evaluation of his recurrent episodes of gross hematuria. He reports that he has seen blood in his urine for many years now he hasn't previously undergone multiple cystoscopies as well as CT scans per patient.  Additional symptoms include suprapubic pain left greater than right occurring intermittently for the last 20 years. Last available CT scan performed with contrast demonstrating no inguinal hernias or other sources to explain patient's recurrent pain. His pain is exacerbated by certain movements clinic walking and lifting his grandchildren.   He denies dysuria, urgency, frequency, flank pain or any other urinary symptoms.  Last cystoscopy 4 years ago in [place].    History of hypogonadism and subsequent PE .  He is receiving testosterone therapy (Androgel) from his PCP with close monitoring.     PMH: Past Medical History  Diagnosis Date  . Fibromyalgia   . Chest pain     neg adenosine myoview in 2008  . Arthritis   . Hyperlipidemia   . Skin cancer     Surgical History: No past surgical history on file.  Home Medications:    Medication List       This list is accurate as of: 03/18/15 11:59 PM.  Always use your most recent med list.               aspirin 81 MG tablet  Take 81 mg by mouth at bedtime.     calcium carbonate 600 MG Tabs tablet  Commonly known as:  OS-CAL  Take 600 mg by mouth every morning.      CENTRUM SILVER PO  Take by mouth.     cholecalciferol 1000 units tablet  Commonly known as:  VITAMIN D  Take 1,000 Units by mouth every morning.     PARoxetine 20 MG tablet  Commonly known as:  PAXIL  Take 20 mg by mouth every morning.     simvastatin 40 MG tablet  Commonly known as:  ZOCOR  Take 40 mg by mouth at bedtime.     Testosterone 20.25 MG/1.25GM (1.62%) Gel  2 pumps to skin every day        Allergies: No Known Allergies  Family History: Family History  Problem Relation Age of Onset  . Heart disease Brother 41    MI  . Heart disease Mother   . Heart disease Father     Social History:  reports that he has never smoked. He has never used smokeless tobacco. He reports that he does not drink alcohol or use illicit drugs.  ROS: UROLOGY Frequent Urination?: No Hard to postpone urination?: No Burning/pain with urination?: No Get up at night to urinate?: Yes Leakage of urine?: Yes Urine stream starts and stops?: No Trouble starting stream?: No Do you have to strain to urinate?: No Blood in urine?: Yes Urinary tract infection?: No Sexually transmitted disease?: No Injury to kidneys or bladder?: No Painful intercourse?: No Weak stream?: No  Erection problems?: No Penile pain?: No  Gastrointestinal Nausea?: No Vomiting?: No Indigestion/heartburn?: No Diarrhea?: No Constipation?: No  Constitutional Fever: No Night sweats?: No Weight loss?: No Fatigue?: No  Skin Skin rash/lesions?: No Itching?: No  Eyes Blurred vision?: No Double vision?: No  Ears/Nose/Throat Sore throat?: No Sinus problems?: No  Hematologic/Lymphatic Swollen glands?: No Easy bruising?: No  Cardiovascular Leg swelling?: No Chest pain?: No  Respiratory Cough?: No Shortness of breath?: No  Endocrine Excessive thirst?: No  Musculoskeletal Back pain?: No Joint pain?: No  Neurological Headaches?: No Dizziness?: No  Psychologic Depression?: No Anxiety?:  No  Physical Exam: BP 135/83 mmHg  Pulse 83  Resp 16  Ht 5\' 9"  (1.753 m)  Wt 203 lb 1.6 oz (92.126 kg)  BMI 29.98 kg/m2  Constitutional:  Alert and oriented, No acute distress. HEENT: [place] AT, moist mucus membranes.  Trachea midline, no masses. Cardiovascular: No clubbing, cyanosis, or edema. Respiratory: Normal respiratory effort, no increased work of breathing. GI: Abdomen is soft, nontender, nondistended, no abdominal masses GU: No CVA tenderness.  Normal circumcised phallus, testicles descended bilaterally without palpable masses or tenderness, no obvious hernias palpable in bilateral inguinal canals DRE: smooth small prostate, nontender  Skin: No rashes, bruises or suspicious lesions. Lymph: No cervical or inguinal adenopathy. Neurologic: Grossly intact, no focal deficits, moving all 4 extremities. Psychiatric: Normal mood and affect.  Laboratory Data:  Lab Results  Component Value Date   WBC 7.6 12/07/2014   HGB 14.6 12/07/2014   HCT 43.0 12/07/2014   MCV 86.9 12/07/2014   PLT 173.0 12/07/2014    Lab Results  Component Value Date   CREATININE 1.06 09/14/2014    Lab Results  Component Value Date   PSA 0.60 09/14/2014   PSA 0.55 11/19/2012   PSA 0.53 11/27/2011    Lab Results  Component Value Date   TESTOSTERONE 128* 12/07/2014    Lab Results  Component Value Date   HGBA1C 6.2 11/19/2012    Urinalysis Results for orders placed or performed in visit on 03/18/15  Microscopic Examination  Result Value Ref Range   WBC, UA 0-5 0 -  5 /hpf   RBC, UA None seen 0 -  2 /hpf   Epithelial Cells (non renal) None seen 0 - 10 /hpf   Mucus, UA Present (A) Not Estab.   Bacteria, UA Few (A) None seen/Few  Urinalysis, Complete  Result Value Ref Range   Specific Gravity, UA >1.030 (H) 1.005 - 1.030   pH, UA 5.5 5.0 - 7.5   Color, UA Yellow Yellow   Appearance Ur Clear Clear   Leukocytes, UA Negative Negative   Protein, UA Trace (A) Negative/Trace   Glucose, UA  Negative Negative   Ketones, UA Trace (A) Negative   RBC, UA Negative Negative   Bilirubin, UA Negative Negative   Urobilinogen, Ur 0.2 0.2 - 1.0 mg/dL   Nitrite, UA Negative Negative   Microscopic Examination See below:      Pertinent Imaging:  CLINICAL DATA: Groin pain, question hernia.  EXAM: CT ABDOMEN AND PELVIS WITH CONTRAST  TECHNIQUE: Multidetector CT imaging of the abdomen and pelvis was performed using the standard protocol following bolus administration of intravenous contrast.  CONTRAST: 157mL OMNIPAQUE IOHEXOL 300 MG/ML SOLN  COMPARISON: [place] ART/VEN FLOW ABD PELV DOPPLER dated 03/12/2013; CT ABD/PELVIS W CM dated 05/27/2012; CT ABD/PELVIS W CM dated 12/01/2011  FINDINGS: Lung bases are clear. No effusions. Heart is normal size.  Liver, spleen, pancreas, adrenals and kidneys are unremarkable.  Gallbladder is contracted, grossly unremarkable. Appendix is visualized and is normal. Moderate stool burden throughout the colon. Small bowel is decompressed.  There are mildly enlarged mesenteric lymph nodes. Central mesenteric lymph node on image 36 has a short axis diameter of 12 mm. Mesenteric lymph node in the left abdomen on image 44 has a short axis diameter of 13 mm. Numerous other similarly sized and smaller mesenteric lymph nodes are noted. These are centrally stable dating back to 2014, likely slightly improved when compared to 2013. No retroperitoneal adenopathy.  Aorta and iliac vessels are calcified, non aneurysmal. No free fluid or free air.  No inguinal hernia noted.  No acute bony abnormality. Degenerative changes in the lower thoracic and lower lumbar spine.  IMPRESSION: No evidence of inguinal hernia.  Stable or slightly smaller prominent mesenteric lymph nodes.   Electronically Signed  By: Rolm Baptise M.D.  On: 03/19/2013 16:22     Assessment & Plan:    1. Chronic pelvic/groin pain- patient description of his  discomfort at least to me to believe that it is more likely a musculoskeletal issue. He has significantly worsening pain with ambulation and other certain movements. I recommended that he be evaluated by physical therapy and have placed a referral for evaluation.  2. Gross Hematuria- I recommended the patient undergo a cystoscopy for further evaluation of his gross hematuria. He can discuss with the physician whether or not he should undergo another CT scan. He states he will go home and discuss this further with his wife and call back to make the appointment if he chooses to go through with it. - Urinalysis, Complete  Return for patient will call for cystoscopy appointment .  These notes generated with voice recognition software. I apologize for typographical errors.  Herbert Moors, [facility] [address] [place], [place] [address] [phone]

## 2015-03-29 ENCOUNTER — Encounter: Payer: Self-pay | Admitting: Family Medicine

## 2015-03-29 ENCOUNTER — Ambulatory Visit (INDEPENDENT_AMBULATORY_CARE_PROVIDER_SITE_OTHER): Payer: Medicare Other | Admitting: Family Medicine

## 2015-03-29 VITALS — BP 122/70 | HR 87 | Temp 98.2°F | Wt 202.5 lb

## 2015-03-29 DIAGNOSIS — R7989 Other specified abnormal findings of blood chemistry: Secondary | ICD-10-CM

## 2015-03-29 DIAGNOSIS — E291 Testicular hypofunction: Secondary | ICD-10-CM

## 2015-03-29 DIAGNOSIS — R972 Elevated prostate specific antigen [PSA]: Secondary | ICD-10-CM | POA: Diagnosis not present

## 2015-03-29 DIAGNOSIS — Z79899 Other long term (current) drug therapy: Secondary | ICD-10-CM

## 2015-03-29 DIAGNOSIS — Z86711 Personal history of pulmonary embolism: Secondary | ICD-10-CM | POA: Diagnosis not present

## 2015-03-29 LAB — CBC WITH DIFFERENTIAL/PLATELET
BASOS ABS: 0 10*3/uL (ref 0.0–0.1)
Basophils Relative: 0.4 % (ref 0.0–3.0)
EOS ABS: 0.3 10*3/uL (ref 0.0–0.7)
Eosinophils Relative: 3.9 % (ref 0.0–5.0)
HCT: 46 % (ref 39.0–52.0)
Hemoglobin: 15 g/dL (ref 13.0–17.0)
LYMPHS ABS: 1.5 10*3/uL (ref 0.7–4.0)
Lymphocytes Relative: 19.7 % (ref 12.0–46.0)
MCHC: 32.6 g/dL (ref 30.0–36.0)
MCV: 87.5 fl (ref 78.0–100.0)
Monocytes Absolute: 0.7 10*3/uL (ref 0.1–1.0)
Monocytes Relative: 9.6 % (ref 3.0–12.0)
NEUTROS ABS: 5 10*3/uL (ref 1.4–7.7)
NEUTROS PCT: 66.4 % (ref 43.0–77.0)
PLATELETS: 167 10*3/uL (ref 150.0–400.0)
RBC: 5.25 Mil/uL (ref 4.22–5.81)
RDW: 13.6 % (ref 11.5–15.5)
WBC: 7.5 10*3/uL (ref 4.0–10.5)

## 2015-03-29 LAB — PSA: PSA: 0.71 ng/mL (ref 0.10–4.00)

## 2015-03-29 NOTE — Assessment & Plan Note (Signed)
Symptoms improving.  Continue current dose. Check labs today.

## 2015-03-29 NOTE — Progress Notes (Signed)
Pre visit review using our clinic review tool, if applicable. No additional management support is needed unless otherwise documented below in the visit note. 

## 2015-03-29 NOTE — Addendum Note (Signed)
Addended by: Ellamae Sia on: 03/29/2015 03:11 PM   Modules accepted: Orders

## 2015-03-29 NOTE — Assessment & Plan Note (Signed)
D dimer per pt request- reasonable given history although I advised this is not a good routine screening tool. The patient indicates understanding of these issues and agrees with the plan.

## 2015-03-29 NOTE — Progress Notes (Signed)
Subjective:    Patient ID: Eric Rice, male    DOB: 12/13/1945, 70 y.o.   MRN: [id number]  HPI  Very pleasant 70 yo male here for testosterone follow up.  Fatigue- This is a chronic intermittent issue. Denies feeling depressed.  Feels he is sleeping through the night. Does not snore.   Saw cardiology for these symptoms in 2015. Neg myocardial perfusion scan on 01/12/14- reviewed. No CP.    He was on testosterone years ago but developed PEs while taking testosterone and has subsequently stopped taking it.  We checked his testosterone at the end of last year and it was in fact low.  I saw him on 12/23/14 to discuss this- despite the increased risks, he did chose to restart testosterone which we did.  He does feel like he has more energy.  SOB a little better.  Per urology, scheduling a cystoscopy for hematuria.  Has not had any additional hematuria in months.  Lab Results  Component Value Date   TESTOSTERONE 128* 12/07/2014       Lab Results  Component Value Date   PSA 0.60 09/14/2014   PSA 0.55 11/19/2012   PSA 0.53 11/27/2011     Lab Results  Component Value Date   WBC 7.6 12/07/2014   HGB 14.6 12/07/2014   HCT 43.0 12/07/2014   MCV 86.9 12/07/2014   PLT 173.0 12/07/2014   Lab Results  Component Value Date   TSH 3.16 12/07/2014   Lab Results  Component Value Date   TESTOSTERONE 128* 12/07/2014   Lab Results  Component Value Date   [id number] 09/14/2014    Patient Active Problem List   Diagnosis Date Noted  . Hyperlipidemia 01/08/2014  . Coronary atherosclerosis of native coronary artery 01/05/2014  . Abnormal ultrasound of carotid artery 08/15/2013  . 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 Healed or old pulmonary embolism 08/04/2013  . 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 Excess weight 08/04/2013  . Diuresis excessive 08/04/2013  . Gonalgia 08/04/2013  . Urgency of micturation 08/04/2013  . Blood pressure elevated without history of HTN 01/31/2013  .  Nocturia 11/19/2012  . Fibromyalgia 11/19/2012  . 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 Acute pulmonary embolism ([place]) 11/30/2011  . Malaise 11/27/2011  . Long term use of drug 11/28/2010  . DIVERTICULAR DISEASE 03/09/2010  . Clinical depression 07/30/2008  . Elevated prostate specific antigen (PSA) 07/30/2008  . 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 Fibromyalgia   . Chest pain     neg adenosine myoview in 2008  . Arthritis   . Hyperlipidemia   . Skin cancer    No past surgical history on file. Social History  Substance Use Topics  . Smoking status: Never Smoker   . Smokeless tobacco: Never Used  . Alcohol Use: No   Family History  Problem Relation Age of Onset  . Heart disease Brother 65    MI  . Heart disease Mother   . Heart disease Father    No Known Allergies Current Outpatient Prescriptions on File Prior to Visit  Medication Sig Dispense Refill  . aspirin 81 MG tablet Take 81 mg by mouth at bedtime.     . calcium carbonate (OS-CAL) 600 MG TABS Take 600 mg by mouth every morning.     . cholecalciferol (VITAMIN D) 1000 UNITS tablet Take 1,000 Units by mouth every morning.     . Multiple Vitamins-Minerals (CENTRUM SILVER PO) Take by  mouth.      Marland Kitchen PARoxetine (PAXIL) 20 MG tablet Take 20 mg by mouth every morning.      . simvastatin (ZOCOR) 40 MG tablet Take 40 mg by mouth at bedtime.      . Testosterone 20.25 MG/1.25GM (1.62%) GEL 2 pumps to skin every day 1.25 g 3   No current facility-administered medications on file prior to visit.   The PMH, PSH, Social History, Family History, Medications, and allergies have been reviewed in [facility], and have been updated if relevant.   Review of Systems  Constitutional: Positive for fatigue. Negative for fever.  HENT: Negative.   Eyes: Negative.   Respiratory: Positive for shortness of breath. Negative for cough, choking, chest tightness, wheezing and stridor.    Cardiovascular: Negative for chest pain, palpitations and leg swelling.  Endocrine: Negative.   Genitourinary: Negative for dysuria, urgency, frequency, hematuria and testicular pain.  Allergic/Immunologic: Negative.   Neurological: Negative.   Hematological: Negative.   Psychiatric/Behavioral: Negative.   All other systems reviewed and are negative.   Objective:   Physical Exam  BP 122/70 mmHg  Pulse 87  Temp(Src) 98.2 F (36.8 C) (Oral)  Wt 202 lb 8 oz (91.853 kg)  SpO2 95% General:  Pleasant male in NAD Eyes:  PERRL Ears:  External ear exam shows no significant lesions or deformities.  Otoscopic examination reveals clear canals, tympanic membranes are intact bilaterally without bulging, retraction, inflammation or discharge. Hearing is grossly normal bilaterally. Nose:  External nasal examination shows no deformity or inflammation. Nasal mucosa are pink and moist without lesions or exudates. Mouth:  Oral mucosa and oropharynx without lesions or exudates.  Teeth in good repair. Neck:  no carotid bruit or thyromegaly no cervical or supraclavicular lymphadenopathy  Lungs:  Normal respiratory effort, chest expands symmetrically. Lungs are clear to auscultation, no crackles or wheezes. Heart:  Normal rate and regular rhythm. S1 and S2 normal without gallop, murmur, click, rub or other extra sounds. Abdomen:  Bowel sounds positive,abdomen soft and non-tender without masses, organomegaly or hernias noted. Prostate:  Prostate gland firm and smooth, 1 plus enlargement, no nodularity, tenderness, mass, asymmetry or induration. Pulses:  R and L posterior tibial pulses are full and equal bilaterally  Extremities:  no edema     Assessment & Plan:

## 2015-03-30 LAB — D-DIMER, QUANTITATIVE: D-Dimer, Quant: 0.91 ug/mL-FEU — ABNORMAL HIGH (ref 0.00–0.48)

## 2015-03-30 LAB — TESTOSTERONE TOTAL,FREE,BIO, MALES
ALBUMIN: 4 g/dL (ref 3.6–5.1)
Sex Hormone Binding: 14 nmol/L — ABNORMAL LOW (ref 22–77)
TESTOSTERONE: 153 ng/dL — AB (ref 250–827)
Testosterone, Bioavailable: 64.9 ng/dL — ABNORMAL LOW (ref 130.5–681.7)
Testosterone, Free: 35.3 pg/mL — ABNORMAL LOW (ref 47.0–244.0)

## 2015-03-31 ENCOUNTER — Telehealth: Payer: Self-pay | Admitting: Family Medicine

## 2015-03-31 NOTE — Telephone Encounter (Signed)
Patient returned Waynetta's call. °

## 2015-03-31 NOTE — Telephone Encounter (Signed)
See results note. 

## 2015-04-01 ENCOUNTER — Telehealth: Payer: Self-pay

## 2015-04-01 ENCOUNTER — Ambulatory Visit: Payer: Medicare Other | Attending: Obstetrics and Gynecology | Admitting: Physical Therapy

## 2015-04-01 DIAGNOSIS — N9489 Other specified conditions associated with female genital organs and menstrual cycle: Secondary | ICD-10-CM

## 2015-04-01 DIAGNOSIS — G8929 Other chronic pain: Secondary | ICD-10-CM | POA: Insufficient documentation

## 2015-04-01 DIAGNOSIS — R102 Pelvic and perineal pain: Secondary | ICD-10-CM | POA: Insufficient documentation

## 2015-04-01 DIAGNOSIS — R279 Unspecified lack of coordination: Secondary | ICD-10-CM | POA: Diagnosis not present

## 2015-04-01 NOTE — Telephone Encounter (Signed)
Eric Rice stop by to see if he could get his lab results if they were released. Call an advise! [phone]

## 2015-04-01 NOTE — Telephone Encounter (Signed)
Lm on pts vm requesting a call back. See results note

## 2015-04-02 ENCOUNTER — Telehealth: Payer: Self-pay

## 2015-04-02 NOTE — Telephone Encounter (Signed)
See results note. 

## 2015-04-02 NOTE — Telephone Encounter (Signed)
Knight returned your call.
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
## 2015-04-26 ENCOUNTER — Telehealth: Payer: Self-pay | Admitting: Physical Therapy

## 2015-04-26 NOTE — Addendum Note (Signed)
Addended by: Jerl Mina on: 04/26/2015 09:37 AM   Modules accepted: Orders

## 2015-04-26 NOTE — Telephone Encounter (Signed)
PT called office of Herbert Moors, NP [facility]) and spoke w/ RN about whether pt is being monitored for testicular cyst and if he has been f/u for the cystoscopy for recurrent hematuria as indicated in pt's note 03/18/15 written by Rutherford Nail. Relayed the findings from PT exam (reproduction of pain w/ palpation on pelvic floor mm, noted pinpoint size nodule, noted increased mm tension. However, pt's pain was decreased with body mechanics training).  Explained pt has not f/u with PT since eval.  RN voiced understanding and will f/u w/ pt.

## 2015-04-26 NOTE — Addendum Note (Signed)
Addended by: Jerl Mina on: 04/26/2015 10:59 AM   Modules accepted: Orders

## 2015-04-26 NOTE — Telephone Encounter (Signed)
PT called pt for the 2nd time following his eval on 04/01/15. PT left voice message to check in with how his HEP has worked out for him during his out of town trip and PT's plan to address deficits with future PT visits. PT provided direct line for further questions.

## 2015-04-26 NOTE — Patient Instructions (Signed)
Reverse kegel (handout)  Body mechanics with lifting and floor to rise (exhale w/ lift and floor to rise)

## 2015-05-13 ENCOUNTER — Ambulatory Visit: Payer: Medicare Other | Attending: Obstetrics and Gynecology | Admitting: Physical Therapy

## 2015-05-13 DIAGNOSIS — N9489 Other specified conditions associated with female genital organs and menstrual cycle: Secondary | ICD-10-CM

## 2015-05-13 DIAGNOSIS — G8929 Other chronic pain: Secondary | ICD-10-CM | POA: Diagnosis not present

## 2015-05-13 DIAGNOSIS — R102 Pelvic and perineal pain: Secondary | ICD-10-CM | POA: Diagnosis not present

## 2015-05-13 DIAGNOSIS — R279 Unspecified lack of coordination: Secondary | ICD-10-CM | POA: Insufficient documentation

## 2015-05-13 NOTE — Patient Instructions (Signed)
Knee out with pillow under , heels together   Inhale , knees together, exhale knees out  10 x 3   _________  Body scan 3 cycles am/ pm   email the recording    ________ handout testicular exam
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
## 2015-05-17 ENCOUNTER — Encounter: Payer: Medicare Other | Admitting: Physical Therapy

## 2015-05-24 ENCOUNTER — Ambulatory Visit: Payer: Medicare Other

## 2015-05-31 ENCOUNTER — Telehealth: Payer: Self-pay | Admitting: Physical Therapy

## 2015-06-16 ENCOUNTER — Ambulatory Visit: Payer: Medicare Other | Attending: Family Medicine | Admitting: Physical Therapy

## 2015-06-16 ENCOUNTER — Encounter: Payer: Self-pay | Admitting: Physical Therapy

## 2015-06-16 DIAGNOSIS — M62838 Other muscle spasm: Secondary | ICD-10-CM | POA: Insufficient documentation
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
## 2015-06-23 ENCOUNTER — Ambulatory Visit: Payer: Medicare Other | Attending: Urology | Admitting: Physical Therapy

## 2015-06-23 ENCOUNTER — Encounter: Payer: Self-pay | Admitting: Physical Therapy

## 2015-06-23 DIAGNOSIS — M62838 Other muscle spasm: Secondary | ICD-10-CM | POA: Insufficient documentation

## 2015-06-23 NOTE — Patient Instructions (Signed)
Isometric Hold (Hook-Lying)    Lie with hips and knees bent. Slowly inhale, and then exhale. Pull navel toward spine and Hold for _5__ seconds. Continue to breathe in and out during hold. Rest for __5_ seconds. Repeat _10__ times. Do 2___ times a day.   Copyright  VHI. All rights reserved.  Any movement, breath out during the hardest part and contract abdomen.  [facility] [address] [place], [place] [address] Phone # [phone] Fax [phone]
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
## 2015-06-30 ENCOUNTER — Encounter: Payer: Self-pay | Admitting: Physical Therapy

## 2015-06-30 ENCOUNTER — Ambulatory Visit: Payer: Medicare Other | Admitting: Physical Therapy

## 2015-06-30 DIAGNOSIS — M62838 Other muscle spasm: Secondary | ICD-10-CM
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
## 2015-07-07 ENCOUNTER — Ambulatory Visit: Payer: Medicare Other | Admitting: Physical Therapy

## 2015-07-07 ENCOUNTER — Encounter: Payer: Self-pay | Admitting: Physical Therapy

## 2015-07-07 DIAGNOSIS — M62838 Other muscle spasm: Secondary | ICD-10-CM | POA: Diagnosis not present
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
## 2015-07-14 ENCOUNTER — Ambulatory Visit: Payer: Medicare Other | Admitting: Physical Therapy

## 2015-07-14 ENCOUNTER — Encounter: Payer: Self-pay | Admitting: Physical Therapy

## 2015-07-14 DIAGNOSIS — M62838 Other muscle spasm: Secondary | ICD-10-CM
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
## 2015-07-20 ENCOUNTER — Encounter: Payer: Self-pay | Admitting: Family Medicine

## 2015-07-20 ENCOUNTER — Ambulatory Visit (INDEPENDENT_AMBULATORY_CARE_PROVIDER_SITE_OTHER): Payer: Medicare Other | Admitting: Family Medicine

## 2015-07-20 VITALS — BP 132/70 | HR 82 | Temp 98.1°F | Wt 202.5 lb

## 2015-07-20 DIAGNOSIS — E291 Testicular hypofunction: Secondary | ICD-10-CM

## 2015-07-20 DIAGNOSIS — R0609 Other forms of dyspnea: Secondary | ICD-10-CM | POA: Diagnosis not present

## 2015-07-20 DIAGNOSIS — Z86711 Personal history of pulmonary embolism: Secondary | ICD-10-CM | POA: Diagnosis not present

## 2015-07-20 DIAGNOSIS — R7989 Other specified abnormal findings of blood chemistry: Secondary | ICD-10-CM

## 2015-07-20 LAB — CBC WITH DIFFERENTIAL/PLATELET
Basophils Absolute: 0 K/uL (ref 0.0–0.1)
Basophils Relative: 0.4 % (ref 0.0–3.0)
Eosinophils Absolute: 0.2 K/uL (ref 0.0–0.7)
Eosinophils Relative: 3.5 % (ref 0.0–5.0)
HCT: 43.9 % (ref 39.0–52.0)
Hemoglobin: 14.8 g/dL (ref 13.0–17.0)
Lymphocytes Relative: 21.6 % (ref 12.0–46.0)
Lymphs Abs: 1.3 K/uL (ref 0.7–4.0)
MCHC: 33.7 g/dL (ref 30.0–36.0)
MCV: 86.2 fl (ref 78.0–100.0)
Monocytes Absolute: 0.7 K/uL (ref 0.1–1.0)
Monocytes Relative: 10.6 % (ref 3.0–12.0)
Neutro Abs: 4 K/uL (ref 1.4–7.7)
Neutrophils Relative %: 63.9 % (ref 43.0–77.0)
Platelets: 167 K/uL (ref 150.0–400.0)
RBC: 5.09 Mil/uL (ref 4.22–5.81)
RDW: 14.4 % (ref 11.5–15.5)
WBC: 6.2 K/uL (ref 4.0–10.5)

## 2015-07-20 LAB — TESTOSTERONE: TESTOSTERONE: 99.43 ng/dL — AB (ref 300.00–890.00)

## 2015-07-20 MED ORDER — TESTOSTERONE 20.25 MG/1.25GM (1.62%) TD GEL: TRANSDERMAL | Status: DC

## 2015-07-20 NOTE — Assessment & Plan Note (Signed)
Intermittent. Given h/o PE, will check D dimer. He is aware that we will have to proceed with another CT angio if it is positive. EKG reassuring- NSR, no change from prior.

## 2015-07-20 NOTE — Progress Notes (Signed)
Subjective:    Patient ID: Eric Rice, male    DOB: 1946/01/12, 70 y.o.   MRN: [id number]  HPI  Very pleasant 70 yo male here for testosterone follow up.  Fatigue- This is a chronic intermittent issue. Denies feeling depressed.  Feels he is sleeping through the night. Does not snore.   Saw cardiology for these symptoms in 2015. Neg myocardial perfusion scan on 01/12/14- reviewed. No CP.    Restarted testosterone and symptoms a little improved.  Has had some DOE.  Does have a h/o PE in past and is aware of risk of taking testosterone.   Lab Results  Component Value Date   TESTOSTERONE 153* 03/29/2015       Lab Results  Component Value Date   PSA 0.71 03/29/2015   PSA 0.60 09/14/2014   PSA 0.55 11/19/2012     Lab Results  Component Value Date   WBC 7.5 03/29/2015   HGB 15.0 03/29/2015   HCT 46.0 03/29/2015   MCV 87.5 03/29/2015   PLT 167.0 03/29/2015   Lab Results  Component Value Date   TSH 3.16 12/07/2014   Lab Results  Component Value Date   TESTOSTERONE 153* 03/29/2015   Lab Results  Component Value Date   [id number] 09/14/2014    Patient Active Problem List   Diagnosis Date Noted  . History of pulmonary embolus (PE) 03/29/2015  . Hyperlipidemia 01/08/2014  . Coronary atherosclerosis of native coronary artery 01/05/2014  . Abnormal ultrasound of carotid artery 08/15/2013  . Polymyalgia ([place]) 08/15/2013  . Healed or old pulmonary embolism 08/04/2013  . 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 Excess weight 08/04/2013  . Diuresis excessive 08/04/2013  . Gonalgia 08/04/2013  . Urgency of micturation 08/04/2013  . Blood pressure elevated without history of HTN 01/31/2013  . Nocturia 11/19/2012  . Fibromyalgia 11/19/2012  . 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 Acute pulmonary embolism ([place]) 11/30/2011  . Malaise 11/27/2011  . Long term use of drug 11/28/2010  . DIVERTICULAR DISEASE 03/09/2010  . Clinical depression 07/30/2008   . Elevated prostate specific antigen (PSA) 07/30/2008  . 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 Fibromyalgia   . Chest pain     neg adenosine myoview in 2008  . Arthritis   . Hyperlipidemia   . Skin cancer    No past surgical history on file. Social History  Substance Use Topics  . Smoking status: Never Smoker   . Smokeless tobacco: Never Used  . Alcohol Use: No   Family History  Problem Relation Age of Onset  . Heart disease Brother 50    MI  . Heart disease Mother   . Heart disease Father    No Known Allergies Current Outpatient Prescriptions on File Prior to Visit  Medication Sig Dispense Refill  . aspirin 81 MG tablet Take 81 mg by mouth at bedtime.     . calcium carbonate (OS-CAL) 600 MG TABS Take 600 mg by mouth every morning.     . cholecalciferol (VITAMIN D) 1000 UNITS tablet Take 1,000 Units by mouth every morning.     . Multiple Vitamins-Minerals (CENTRUM SILVER PO) Take by mouth.      Marland Kitchen PARoxetine (PAXIL) 20 MG tablet Take 20 mg by mouth every morning.      . simvastatin (ZOCOR) 40 MG tablet Take 40 mg by mouth at bedtime.      . Testosterone 20.25 MG/1.25GM (1.62%) GEL 2 pumps to  skin every day 1.25 g 3   No current facility-administered medications on file prior to visit.   The PMH, PSH, Social History, Family History, Medications, and allergies have been reviewed in [facility], and have been updated if relevant.   Review of Systems  Constitutional: Positive for fatigue. Negative for fever.  HENT: Negative.   Eyes: Negative.   Respiratory: Positive for shortness of breath. Negative for cough, choking, chest tightness, wheezing and stridor.   Cardiovascular: Negative for chest pain, palpitations and leg swelling.  Endocrine: Negative.   Genitourinary: Negative for dysuria, urgency, frequency, hematuria and testicular pain.  Allergic/Immunologic: Negative.   Neurological: Negative.   Hematological: Negative.    Psychiatric/Behavioral: Negative.   All other systems reviewed and are negative.   Objective:   Physical Exam  BP 132/70 mmHg  Pulse 82  Temp(Src) 98.1 F (36.7 C) (Oral)  Wt 202 lb 8 oz (91.853 kg)  SpO2 97% General:  Pleasant male in NAD Eyes:  PERRL Ears:  External ear exam shows no significant lesions or deformities.  Otoscopic examination reveals clear canals, tympanic membranes are intact bilaterally without bulging, retraction, inflammation or discharge. Hearing is grossly normal bilaterally. Nose:  External nasal examination shows no deformity or inflammation. Nasal mucosa are pink and moist without lesions or exudates. Mouth:  Oral mucosa and oropharynx without lesions or exudates.  Teeth in good repair. Neck:  no carotid bruit or thyromegaly no cervical or supraclavicular lymphadenopathy  Lungs:  Normal respiratory effort, chest expands symmetrically. Lungs are clear to auscultation, no crackles or wheezes. Heart:  Normal rate and regular rhythm. S1 and S2 normal without gallop, murmur, click, rub or other extra sounds. Abdomen:  Bowel sounds positive,abdomen soft and non-tender without masses, organomegaly or hernias noted.. Pulses:  R and L posterior tibial pulses are full and equal bilaterally  Extremities:  no edema     Assessment & Plan:

## 2015-07-20 NOTE — Addendum Note (Signed)
Addended by: Lucille Passy on: 07/20/2015 01:11 PM   Modules accepted: Orders

## 2015-07-20 NOTE — Assessment & Plan Note (Signed)
Symptoms improving. Continue current dose. Check labs today.

## 2015-07-20 NOTE — Progress Notes (Signed)
Pre visit review using our clinic review tool, if applicable. No additional management support is needed unless otherwise documented below in the visit note. 

## 2015-07-20 NOTE — Patient Instructions (Signed)
Great to see you. Please say hi to Manuela Schwartz for me.  I will call you with your lab results.  Your EKG looks great.

## 2015-07-21 ENCOUNTER — Ambulatory Visit (HOSPITAL_COMMUNITY)
Admission: RE | Admit: 2015-07-21 | Discharge: 2015-07-21 | Disposition: A | Discharge status: Home / Self Care | Payer: Medicare Other | Source: Ambulatory Visit | Attending: Family Medicine | Admitting: Family Medicine

## 2015-07-21 ENCOUNTER — Telehealth: Payer: Self-pay | Admitting: Family Medicine

## 2015-07-21 ENCOUNTER — Encounter: Payer: Medicare Other | Admitting: Physical Therapy

## 2015-07-21 ENCOUNTER — Other Ambulatory Visit: Payer: Self-pay | Admitting: Family Medicine

## 2015-07-21 DIAGNOSIS — R0602 Shortness of breath: Secondary | ICD-10-CM | POA: Diagnosis not present

## 2015-07-21 DIAGNOSIS — R7989 Other specified abnormal findings of blood chemistry: Secondary | ICD-10-CM

## 2015-07-21 DIAGNOSIS — R791 Abnormal coagulation profile: Secondary | ICD-10-CM | POA: Insufficient documentation

## 2015-07-21 LAB — D-DIMER, QUANTITATIVE: D-Dimer, Quant: 1.78 ug/mL-FEU — ABNORMAL HIGH (ref 0.00–0.48)

## 2015-07-21 LAB — POCT I-STAT CREATININE: CREATININE: 1.1 mg/dL (ref 0.61–1.24)

## 2015-07-21 MED ORDER — IOPAMIDOL (ISOVUE-370) INJECTION 76%
INTRAVENOUS | Status: AC
  Administered 2015-07-21: 80 mL
  Filled 2015-07-21: qty 100

## 2015-07-21 NOTE — Telephone Encounter (Signed)
Dr. Deborra Medina, Pt is scheduled today at 230pm for CT Angio Chest. Please order STAT BUN/Crea.  Pt also stated he is taking testosterone daily. Please advise

## 2015-07-21 NOTE — Telephone Encounter (Signed)
Order entered

## 2015-07-21 NOTE — Telephone Encounter (Signed)
Also I gave them your cell # for call report since you are leaving office before his scan

## 2015-07-22 DIAGNOSIS — L738 Other specified follicular disorders: Secondary | ICD-10-CM | POA: Diagnosis not present

## 2015-07-22 DIAGNOSIS — L309 Dermatitis, unspecified: Secondary | ICD-10-CM | POA: Diagnosis not present

## 2015-07-22 DIAGNOSIS — L814 Other melanin hyperpigmentation: Secondary | ICD-10-CM | POA: Diagnosis not present

## 2015-07-22 DIAGNOSIS — L821 Other seborrheic keratosis: Secondary | ICD-10-CM | POA: Diagnosis not present

## 2015-07-22 DIAGNOSIS — D1801 Hemangioma of skin and subcutaneous tissue: Secondary | ICD-10-CM | POA: Diagnosis not present

## 2015-07-22 DIAGNOSIS — L918 Other hypertrophic disorders of the skin: Secondary | ICD-10-CM | POA: Diagnosis not present

## 2015-07-22 DIAGNOSIS — Z8589 Personal history of malignant neoplasm of other organs and systems: Secondary | ICD-10-CM | POA: Diagnosis not present

## 2015-07-28 ENCOUNTER — Encounter: Payer: Self-pay | Admitting: Physical Therapy

## 2015-07-28 ENCOUNTER — Ambulatory Visit: Payer: Medicare Other | Attending: Urology | Admitting: Physical Therapy

## 2015-07-28 DIAGNOSIS — M62838 Other muscle spasm: Secondary | ICD-10-CM | POA: Diagnosis not present
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
## 2015-07-28 NOTE — Patient Instructions (Signed)
[facility] for Prostatitis and CPPS (Chronic Pelvic Pain Syndrome) feat. Janalee Dane   [facility] [address] [place], [place] [address] Phone # [phone] Fax [phone]

## 2015-08-04 ENCOUNTER — Encounter: Payer: Medicare Other | Admitting: Physical Therapy

## 2015-08-18 ENCOUNTER — Telehealth: Payer: Self-pay | Admitting: Family Medicine

## 2015-08-18 ENCOUNTER — Other Ambulatory Visit (INDEPENDENT_AMBULATORY_CARE_PROVIDER_SITE_OTHER): Payer: Medicare Other

## 2015-08-18 DIAGNOSIS — E291 Testicular hypofunction: Secondary | ICD-10-CM | POA: Diagnosis not present

## 2015-08-18 DIAGNOSIS — R7989 Other specified abnormal findings of blood chemistry: Secondary | ICD-10-CM

## 2015-08-18 NOTE — Telephone Encounter (Signed)
Orders placed.

## 2015-08-18 NOTE — Telephone Encounter (Signed)
Pt is requesting orders for testosterone.  Pt will be in this am to have labs drawn   Thank you

## 2015-08-21 LAB — TESTOS,TOTAL,FREE AND SHBG (FEMALE)
SEX HORMONE BINDING GLOB.: 14 nmol/L — AB (ref 22–77)
TESTOSTERONE,FREE: 59.4 pg/mL (ref 30.0–135.0)
Testosterone,Total,LC/MS/MS: 287 ng/dL (ref 250–1100)

## 2015-08-23 ENCOUNTER — Encounter: Payer: Self-pay | Admitting: Family Medicine

## 2015-08-23 NOTE — Addendum Note (Signed)
Addended by: Lucille Passy on: 08/23/2015 08:02 AM   Modules accepted: Orders

## 2015-08-25 ENCOUNTER — Other Ambulatory Visit: Payer: Self-pay | Admitting: Family Medicine

## 2015-08-25 MED ORDER — TESTOSTERONE 20.25 MG/1.25GM (1.62%) TD GEL: TRANSDERMAL | Status: AC

## 2015-08-26 ENCOUNTER — Encounter (HOSPITAL_COMMUNITY): Payer: Medicare Other

## 2015-10-15 ENCOUNTER — Telehealth: Payer: Self-pay | Admitting: *Deleted

## 2015-10-15 NOTE — Telephone Encounter (Signed)
TC to PT to schedule AWV. LVM for pt to call back office

## 2015-10-19 NOTE — Telephone Encounter (Signed)
Patient returned call and said he would pass on the AWV.

## 2015-11-15 DIAGNOSIS — L74519 Primary focal hyperhidrosis, unspecified: Secondary | ICD-10-CM | POA: Diagnosis not present

## 2015-11-15 DIAGNOSIS — Z1283 Encounter for screening for malignant neoplasm of skin: Secondary | ICD-10-CM | POA: Diagnosis not present

## 2015-11-15 DIAGNOSIS — B078 Other viral warts: Secondary | ICD-10-CM | POA: Diagnosis not present

## 2015-12-21 ENCOUNTER — Telehealth: Payer: Self-pay | Admitting: Family Medicine

## 2015-12-21 NOTE — Telephone Encounter (Signed)
That depends on how he's been feeling.  We can certainly check it now if he feels he is still having symptoms.

## 2015-12-21 NOTE — Telephone Encounter (Signed)
Pt called and wanted to know when he should come in for a repeat testosterone check or for a lab check.  Best number to call pt is [phone]

## 2015-12-22 NOTE — Telephone Encounter (Signed)
Lm on pts vm and advised per Dr Deborra Medina. Requested he contact office back and advise whether he is wanting to repeat labs

## 2015-12-22 NOTE — Telephone Encounter (Signed)
Eric Rice can you help pt with this

## 2016-01-03 ENCOUNTER — Other Ambulatory Visit: Payer: Self-pay | Admitting: Family Medicine

## 2016-01-03 DIAGNOSIS — Z1159 Encounter for screening for other viral diseases: Secondary | ICD-10-CM

## 2016-01-03 DIAGNOSIS — Z0000 Encounter for general adult medical examination without abnormal findings: Secondary | ICD-10-CM

## 2016-01-03 DIAGNOSIS — R7989 Other specified abnormal findings of blood chemistry: Secondary | ICD-10-CM

## 2016-01-03 DIAGNOSIS — R972 Elevated prostate specific antigen [PSA]: Secondary | ICD-10-CM

## 2016-01-05 ENCOUNTER — Ambulatory Visit (INDEPENDENT_AMBULATORY_CARE_PROVIDER_SITE_OTHER): Payer: Medicare Other

## 2016-01-05 VITALS — BP 136/88 | HR 69 | Temp 98.3°F | Ht 67.5 in | Wt 198.8 lb

## 2016-01-05 DIAGNOSIS — Z23 Encounter for immunization: Secondary | ICD-10-CM

## 2016-01-05 DIAGNOSIS — Z125 Encounter for screening for malignant neoplasm of prostate: Secondary | ICD-10-CM

## 2016-01-05 DIAGNOSIS — Z1159 Encounter for screening for other viral diseases: Secondary | ICD-10-CM

## 2016-01-05 DIAGNOSIS — Z8639 Personal history of other endocrine, nutritional and metabolic disease: Secondary | ICD-10-CM

## 2016-01-05 DIAGNOSIS — E291 Testicular hypofunction: Secondary | ICD-10-CM

## 2016-01-05 DIAGNOSIS — Z0000 Encounter for general adult medical examination without abnormal findings: Secondary | ICD-10-CM

## 2016-01-05 DIAGNOSIS — R7989 Other specified abnormal findings of blood chemistry: Secondary | ICD-10-CM

## 2016-01-05 LAB — COMPREHENSIVE METABOLIC PANEL
ALT: 23 U/L (ref 0–53)
AST: 20 U/L (ref 0–37)
Albumin: 4.1 g/dL (ref 3.5–5.2)
Alkaline Phosphatase: 46 U/L (ref 39–117)
BUN: 17 mg/dL (ref 6–23)
CHLORIDE: 105 meq/L (ref 96–112)
CO2: 30 meq/L (ref 19–32)
Calcium: 9.4 mg/dL (ref 8.4–10.5)
Creatinine, Ser: 1.06 mg/dL (ref 0.40–1.50)
GFR: 73.17 mL/min (ref >60.00)
Glucose, Bld: 100 mg/dL — ABNORMAL HIGH (ref 70–99)
POTASSIUM: 4.6 meq/L (ref 3.5–5.1)
SODIUM: 141 meq/L (ref 135–145)
Total Bilirubin: 0.8 mg/dL (ref 0.2–1.2)
Total Protein: 6.5 g/dL (ref 6.0–8.3)

## 2016-01-05 LAB — CBC WITH DIFFERENTIAL/PLATELET
Basophils Absolute: 0 10*3/uL (ref 0.0–0.1)
Basophils Relative: 0.3 % (ref 0.0–3.0)
EOS PCT: 4.6 % (ref 0.0–5.0)
Eosinophils Absolute: 0.4 10*3/uL (ref 0.0–0.7)
HCT: 47.1 % (ref 39.0–52.0)
Hemoglobin: 15.8 g/dL (ref 13.0–17.0)
LYMPHS ABS: 1.3 10*3/uL (ref 0.7–4.0)
Lymphocytes Relative: 17.7 % (ref 12.0–46.0)
MCHC: 33.6 g/dL (ref 30.0–36.0)
MCV: 85.9 fl (ref 78.0–100.0)
MONO ABS: 0.6 10*3/uL (ref 0.1–1.0)
Monocytes Relative: 8 % (ref 3.0–12.0)
NEUTROS ABS: 5.2 10*3/uL (ref 1.4–7.7)
NEUTROS PCT: 69.4 % (ref 43.0–77.0)
PLATELETS: 157 10*3/uL (ref 150.0–400.0)
RBC: 5.48 Mil/uL (ref 4.22–5.81)
RDW: 14.4 % (ref 11.5–15.5)
WBC: 7.6 10*3/uL (ref 4.0–10.5)

## 2016-01-05 LAB — LIPID PANEL
Cholesterol: 118 mg/dL (ref 0–200)
HDL: 35.6 mg/dL — AB (ref >39.00)
LDL CALC: 59 mg/dL (ref 0–99)
NONHDL: 81.9
Total CHOL/HDL Ratio: 3
Triglycerides: 116 mg/dL (ref 0.0–149.0)
VLDL: 23.2 mg/dL (ref 0.0–40.0)

## 2016-01-05 LAB — HEMOGLOBIN A1C: HEMOGLOBIN A1C: 6 % (ref 4.6–6.5)

## 2016-01-05 LAB — PSA, MEDICARE: PSA: 0.6 ng/mL (ref 0.10–4.00)

## 2016-01-05 NOTE — Patient Instructions (Signed)
Eric Rice , Thank you for taking time to come for your Medicare Wellness Visit. I appreciate your ongoing commitment to your health goals. Please review the following plan we discussed and let me know if I can assist you in the future.   These are the goals we discussed: Goals    . water          Starting 01/05/2016, I will continue to drink at least 8 glasses of water daily.        This is a list of the screening recommended for you and due dates:  Health Maintenance  Topic Date Due  . Colon Cancer Screening  02/11/2020*  . Pneumonia vaccines (2 of 2 - PPSV23) 01/04/2017  . Tetanus Vaccine  02/20/2018  . Flu Shot  Completed  . Shingles Vaccine  Addressed  .  Hepatitis C: One time screening is recommended by Center for Disease Control  (CDC) for  adults born from [birth year] through 1966.   Completed  *Topic was postponed. The date shown is not the original due date.   Preventive Care for Adults  A healthy lifestyle and preventive care can promote health and wellness. Preventive health guidelines for adults include the following key practices.  . A routine yearly physical is a good way to check with your health care provider about your health and preventive screening. It is a chance to share any concerns and updates on your health and to receive a thorough exam.  . Visit your dentist for a routine exam and preventive care every 6 months. Brush your teeth twice a day and floss once a day. Good oral hygiene prevents tooth decay and gum disease.  . The frequency of eye exams is based on your age, health, family medical history, use  of contact lenses, and other factors. Follow your health care provider's ecommendations for frequency of eye exams.  . Eat a healthy diet. Foods like vegetables, fruits, whole grains, low-fat dairy products, and lean protein foods contain the nutrients you need without too many calories. Decrease your intake of foods high in solid fats, added sugars, and salt. Eat  the right amount of calories for you. Get information about a proper diet from your health care provider, if necessary.  . Regular physical exercise is one of the most important things you can do for your health. Most adults should get at least 150 minutes of moderate-intensity exercise (any activity that increases your heart rate and causes you to sweat) each week. In addition, most adults need muscle-strengthening exercises on 2 or more days a week.  Silver Sneakers may be a benefit available to you. To determine eligibility, you may visit the website: www.silversneakers.com or contact program at 910-307-5390 Mon-Fri between 8AM-8PM.   . Maintain a healthy weight. The body mass index (BMI) is a screening tool to identify possible weight problems. It provides an estimate of body fat based on height and weight. Your health care provider can find your BMI and can help you achieve or maintain a healthy weight.   For adults 20 years and older: ? A BMI below 18.5 is considered underweight. ? A BMI of 18.5 to 24.9 is normal. ? A BMI of 25 to 29.9 is considered overweight. ? A BMI of 30 and above is considered obese.   . Maintain normal blood lipids and cholesterol levels by exercising and minimizing your intake of saturated fat. Eat a balanced diet with plenty of fruit and vegetables. Blood tests for lipids and  cholesterol should begin at age 12 and be repeated every 5 years. If your lipid or cholesterol levels are high, you are over 50, or you are at high risk for heart disease, you may need your cholesterol levels checked more frequently. Ongoing high lipid and cholesterol levels should be treated with medicines if diet and exercise are not working.  . If you smoke, find out from your health care provider how to quit. If you do not use tobacco, please do not start.  . If you choose to drink alcohol, please do not consume more than 2 drinks per day. One drink is considered to be 12 ounces (355 mL)  of beer, 5 ounces (148 mL) of wine, or 1.5 ounces (44 mL) of liquor.  . If you are 76-64 years old, ask your health care provider if you should take aspirin to prevent strokes.  . Use sunscreen. Apply sunscreen liberally and repeatedly throughout the day. You should seek shade when your shadow is shorter than you. Protect yourself by wearing long sleeves, pants, a wide-brimmed hat, and sunglasses year round, whenever you are outdoors.  . Once a month, do a whole body skin exam, using a mirror to look at the skin on your back. Tell your health care provider of new moles, moles that have irregular borders, moles that are larger than a pencil eraser, or moles that have changed in shape or color.

## 2016-01-05 NOTE — Progress Notes (Signed)
PCP notes:   Health maintenance:  Flu vaccine - administered PCV13 - administered Hep C screening - completed Shingles - per pt report, administered in 2016 Tetanus - per pt report, administered in 2010 Colon cancer - pt will discuss screening with PCP at next appt  Abnormal screenings:   None  Patient concerns:   Pt wanted testosterone level checked with labs. However, due to timing, this lab was unable to be completed today. Pt may need order to be resubmitted to have it completed at lab closer to home.Pt plans to discuss with PCP at next appt.   Nurse concerns:  None  Next PCP appt:   01/10/16 @ 1200

## 2016-01-05 NOTE — Progress Notes (Signed)
Subjective:   Eric Rice is a 70 y.o. male who presents for Medicare Annual/Subsequent preventive examination.  Review of Systems: N/A Cardiac Risk Factors include: advanced age (>70men, >74 women);male gender     Objective:    Vitals: BP 136/88 (BP Location: Right Arm, Patient Position: Sitting, Cuff Size: Normal)   Pulse 69   Temp 98.3 F (36.8 C) (Oral)   Ht 5' 7.5" (1.715 m) Comment: no shoes  Wt 198 lb 12 oz (90.2 kg)   SpO2 93%   BMI 30.67 kg/m   Body mass index is 30.67 kg/m.  Tobacco History  Smoking Status  . Never Smoker  Smokeless Tobacco  . Never Used     Counseling given: No   Past Medical History:  Diagnosis Date  . Arthritis   . Chest pain    neg adenosine myoview in 2008  . Fibromyalgia   . Hyperlipidemia   . Skin cancer    History reviewed. No pertinent surgical history. Family History  Problem Relation Age of Onset  . Heart disease Brother 33    MI  . Heart disease Mother   . Heart disease Father    History  Sexual Activity  . Sexual activity: Not on file    Outpatient Encounter Prescriptions as of 01/05/2016  Medication Sig  . aspirin 81 MG tablet Take 81 mg by mouth at bedtime.   . calcium carbonate (OS-CAL) 600 MG TABS Take 600 mg by mouth every morning.   . cholecalciferol (VITAMIN D) 1000 UNITS tablet Take 1,000 Units by mouth every morning.   . Multiple Vitamins-Minerals (CENTRUM SILVER PO) Take by mouth.    Marland Kitchen PARoxetine (PAXIL) 20 MG tablet Take 20 mg by mouth every morning.    . simvastatin (ZOCOR) 40 MG tablet Take 40 mg by mouth at bedtime.    . Testosterone 20.25 MG/1.25GM (1.62%) GEL 3 pumps to skin every day Please dispense quantity needed for one month   No facility-administered encounter medications on file as of 01/05/2016.     Activities of Daily Living In your present state of health, do you have any difficulty performing the following activities: 01/05/2016  Hearing? Y  Vision? N  Difficulty concentrating  or making decisions? N  Walking or climbing stairs? N  Dressing or bathing? N  Doing errands, shopping? N  Preparing Food and eating ? N  Using the Toilet? N  In the past six months, have you accidently leaked urine? N  Do you have problems with loss of bowel control? N  Managing your Medications? N  Managing your Finances? N  Housekeeping or managing your Housekeeping? N  Some recent data might be hidden    Patient Care Team: Lucille Passy, MD as PCP - General (Family Medicine)   Assessment:    Hearing Screening Comments: Pt has hearing aids prescribed but does not wear them Vision Screening Comments: Last vision exam at [facility] approx. 6 mths ago  Exercise Activities and Dietary recommendations Current Exercise Habits: The patient does not participate in regular exercise at present, Exercise limited by: None identified  Goals    . water          Starting 01/05/2016, I will continue to drink at least 8 glasses of water daily.       Fall Risk Fall Risk  01/05/2016  Falls in the past year? No   Depression Screen PHQ 2/9 Scores 01/05/2016  PHQ - 2 Score 0    Cognitive Function MMSE -  Mini Mental State Exam 01/05/2016  Orientation to time 5  Orientation to Place 5  Registration 3  Attention/ Calculation 0  Recall 3  Language- name 2 objects 0  Language- repeat 1  Language- follow 3 step command 3  Language- read & follow direction 0  Write a sentence 0  Copy design 0  Total score 20     PLEASE NOTE: A Mini-Cog screen was completed. Maximum score is 20. A value of 0 denotes this part of Folstein MMSE was not completed or the patient failed this part of the Mini-Cog screening.   Mini-Cog Screening Orientation to Time - Max 5 pts Orientation to Place - Max 5 pts Registration - Max 3 pts Recall - Max 3 pts Language Repeat - Max 1 pts Language Follow 3 Step Command - Max 3 pts     Immunization History  Administered Date(s) Administered  . Influenza Split  11/28/2010  . Influenza,inj,Quad PF,36+ Mos 11/19/2012, 01/01/2014, 01/05/2016  . Pneumococcal Conjugate-13 01/05/2016   Screening Tests Health Maintenance  Topic Date Due  . COLONOSCOPY  02/11/2020 (Originally 02/12/2015)  . PNA vac Low Risk Adult (2 of 2 - PPSV23) 01/04/2017  . TETANUS/TDAP  02/20/2018  . INFLUENZA VACCINE  Completed  . ZOSTAVAX  Addressed  . Hepatitis C Screening  Completed      Plan:     I have personally reviewed and addressed the Medicare Annual Wellness questionnaire and have noted the following in the patient's chart:  A. Medical and social history B. Use of alcohol, tobacco or illicit drugs  C. Current medications and supplements D. Functional ability and status E.  Nutritional status F.  Physical activity G. Advance directives H. List of other physicians I.  Hospitalizations, surgeries, and ER visits in previous 12 months J.  [place] to include hearing, vision, cognitive, depression L. Referrals and appointments - none  In addition, I have reviewed and discussed with patient certain preventive protocols, quality metrics, and best practice recommendations. A written personalized care plan for preventive services as well as general preventive health recommendations were provided to patient.  See attached scanned questionnaire for additional information.   Signed,   Lindell Noe, MHA, BS, LPN Health Coach

## 2016-01-05 NOTE — Progress Notes (Signed)
Pre visit review using our clinic review tool, if applicable. No additional management support is needed unless otherwise documented below in the visit note. 

## 2016-01-06 LAB — HEPATITIS C ANTIBODY: HCV Ab: NEGATIVE

## 2016-01-07 NOTE — Progress Notes (Signed)
I reviewed health advisor's note, was available for consultation, and agree with documentation and plan.  

## 2016-01-10 ENCOUNTER — Encounter: Payer: Self-pay | Admitting: Family Medicine

## 2016-01-10 ENCOUNTER — Ambulatory Visit (INDEPENDENT_AMBULATORY_CARE_PROVIDER_SITE_OTHER): Payer: Medicare Other | Admitting: Family Medicine

## 2016-01-10 VITALS — BP 132/78 | HR 81 | Temp 98.3°F | Ht 67.5 in | Wt 199.2 lb

## 2016-01-10 DIAGNOSIS — Z86711 Personal history of pulmonary embolism: Secondary | ICD-10-CM | POA: Diagnosis not present

## 2016-01-10 DIAGNOSIS — F320 Major depressive disorder, single episode, mild: Secondary | ICD-10-CM | POA: Diagnosis not present

## 2016-01-10 DIAGNOSIS — Z0000 Encounter for general adult medical examination without abnormal findings: Secondary | ICD-10-CM | POA: Diagnosis not present

## 2016-01-10 DIAGNOSIS — E785 Hyperlipidemia, unspecified: Secondary | ICD-10-CM | POA: Diagnosis not present

## 2016-01-10 DIAGNOSIS — R0609 Other forms of dyspnea: Secondary | ICD-10-CM

## 2016-01-10 DIAGNOSIS — R7989 Other specified abnormal findings of blood chemistry: Secondary | ICD-10-CM

## 2016-01-10 DIAGNOSIS — E349 Endocrine disorder, unspecified: Secondary | ICD-10-CM

## 2016-01-10 DIAGNOSIS — R972 Elevated prostate specific antigen [PSA]: Secondary | ICD-10-CM | POA: Diagnosis not present

## 2016-01-10 NOTE — Progress Notes (Signed)
Subjective:   Patient ID: Eric Rice, male    DOB: 1945/06/07, 70 y.o.   MRN: [id number]  [name] is a pleasant 70 y.o. year old male who presents to clinic today with Follow-up  on 01/10/2016  HPI:  Medicare Wellness Visit with Eric Musa, RN on 01/05/16.  Note reviewed.  Fatigue- This is a chronic intermittent issue. Denies feeling depressed.  Feels he is sleeping through the night.  Saw cardiology for these symptoms in 2015. Neg myocardial perfusion scan on 01/12/14- reviewed. No CP.    He was on testosterone years ago but developed PEs while taking testosterone and has subsequently stopped taking it.  We checked his testosterone at the end of last year and it was in fact low.  I saw him on 12/23/14 to discuss this- despite the increased risks, he did chose to restart testosterone which we did.  He does feel like he has more energy.  Lab Results  Component Value Date   TESTOSTERONE 99.43 (L) 07/20/2015   Depression- symptoms have been well controlled with Paxil.  Paxil has also helped with his symptoms of fibromyalgia.  HLD- well controlled with zocor 40 mg daily.  Lab Results  Component Value Date   CHOL 118 01/05/2016   HDL 35.60 (L) 01/05/2016   LDLCALC 59 01/05/2016   LDLDIRECT 79.6 10/18/2009   TRIG 116.0 01/05/2016   CHOLHDL 3 01/05/2016   Lab Results  Component Value Date   ALT 23 01/05/2016   AST 20 01/05/2016   ALKPHOS 46 01/05/2016   BILITOT 0.8 01/05/2016   Lab Results  Component Value Date   PSA 0.60 01/05/2016   PSA 0.71 03/29/2015   PSA 0.60 09/14/2014   Current Outpatient Prescriptions on File Prior to Visit  Medication Sig Dispense Refill  . aspirin 81 MG tablet Take 81 mg by mouth at bedtime.     . calcium carbonate (OS-CAL) 600 MG TABS Take 600 mg by mouth every morning.     . cholecalciferol (VITAMIN D) 1000 UNITS tablet Take 1,000 Units by mouth every morning.     . Multiple Vitamins-Minerals (CENTRUM SILVER PO) Take by  mouth.      Marland Kitchen PARoxetine (PAXIL) 20 MG tablet Take 20 mg by mouth every morning.      . simvastatin (ZOCOR) 40 MG tablet Take 40 mg by mouth at bedtime.      . Testosterone 20.25 MG/1.25GM (1.62%) GEL 3 pumps to skin every day Please dispense quantity needed for one month 1.25 g 3   No current facility-administered medications on file prior to visit.     No Known Allergies  Past Medical History:  Diagnosis Date  . Arthritis   . Chest pain    neg adenosine myoview in 2008  . Fibromyalgia   . Hyperlipidemia   . Skin cancer     No past surgical history on file.  Family History  Problem Relation Age of Onset  . Heart disease Brother 59    MI  . Heart disease Mother   . Heart disease Father     Social History   Social History  . Marital status: Married    Spouse name: N/A  . Number of children: N/A  . Years of education: N/A   Occupational History  . Not on file.   Social History Main Topics  . Smoking status: Never Smoker  . Smokeless tobacco: Never Used  . Alcohol use No  . Drug use: No  .  Sexual activity: Not on file   Other Topics Concern  . Not on file   Social History Narrative   Retired from [employer].   Married to [employer], no kids- one step son.   The PMH, PSH, Social History, Family History, Medications, and allergies have been reviewed in [facility], and have been updated if relevant.   Review of Systems  Constitutional: Positive for fatigue.  HENT: Negative.   Eyes: Negative.   Respiratory: Negative.   Cardiovascular: Negative.   Gastrointestinal: Negative.   Endocrine: Negative.   Genitourinary: Negative.   Musculoskeletal: Negative.   Allergic/Immunologic: Negative.   Neurological: Negative.   Hematological: Negative.   Psychiatric/Behavioral: Negative.   All other systems reviewed and are negative.      Objective:    BP 132/78   Pulse 81   Temp 98.3 F (36.8 C) (Oral)   Ht 5' 7.5" (1.715 m)   Wt 199 lb 4 oz (90.4 kg)   SpO2 97%    BMI 30.75 kg/m    Physical Exam  General:  pleasant male in no acute distress Eyes:  PERRL Ears:  External ear exam shows no significant lesions or deformities.  TMs normal bilaterally Hearing is grossly normal bilaterally. Nose:  External nasal examination shows no deformity or inflammation. Nasal mucosa are pink and moist without lesions or exudates. Mouth:  Oral mucosa and oropharynx without lesions or exudates.  Teeth in good repair. Rice:  no carotid bruit or thyromegaly no cervical or supraclavicular lymphadenopathy  Lungs:  Normal respiratory effort, chest expands symmetrically. Lungs are clear to auscultation, no crackles or wheezes. Heart:  Normal rate and regular rhythm. S1 and S2 normal without gallop, murmur, click, rub or other extra sounds. Abdomen:  Bowel sounds positive,abdomen soft and non-tender without masses, organomegaly or hernias noted. Pulses:  R and L posterior tibial pulses are full and equal bilaterally  Extremities:  no edema  Psych:  Good eye contact, not anxious or depressed appearing       Assessment & Plan:   Visit for well man health check  History of pulmonary embolus (PE)  Elevated prostate specific antigen (PSA)  Hyperlipidemia, unspecified hyperlipidemia type  Mild single current episode of major depressive disorder (Why) No Follow-up on file.

## 2016-01-10 NOTE — Progress Notes (Signed)
Pre visit review using our clinic review tool, if applicable. No additional management support is needed unless otherwise documented below in the visit note. 

## 2016-01-10 NOTE — Assessment & Plan Note (Signed)
Resolved

## 2016-01-10 NOTE — Patient Instructions (Signed)
Great to see you.  Happy Holidays!  I will let you know once I receive your testosterone results.

## 2016-01-10 NOTE — Assessment & Plan Note (Signed)
Well controlled on current dose zocor. No changes made.

## 2016-01-10 NOTE — Assessment & Plan Note (Signed)
Well controlled with current dose of Paxil. No changes made.

## 2016-01-10 NOTE — Assessment & Plan Note (Signed)
Order written and given to pt check morning testosterone.

## 2016-01-17 ENCOUNTER — Other Ambulatory Visit: Payer: Self-pay | Admitting: Family Medicine

## 2016-01-17 DIAGNOSIS — E349 Endocrine disorder, unspecified: Secondary | ICD-10-CM | POA: Diagnosis not present

## 2016-01-18 ENCOUNTER — Encounter: Payer: Self-pay | Admitting: Family Medicine

## 2016-01-18 LAB — TESTOSTERONE, FREE: TESTOSTERONE FREE: 22.2 pg/mL — AB (ref 6.6–18.1)

## 2016-01-18 LAB — TESTOSTERONE: TESTOSTERONE: 532 ng/dL (ref 264–916)

## 2016-01-24 DIAGNOSIS — J069 Acute upper respiratory infection, unspecified: Secondary | ICD-10-CM | POA: Diagnosis not present

## 2016-03-01 ENCOUNTER — Telehealth: Payer: Self-pay | Admitting: Family Medicine

## 2016-03-01 NOTE — Telephone Encounter (Signed)
Pt was seen for his AWV in Nov 2017. He was assured by our office Medicare would cover visits but he has received a bill. Pt is requesting a cb.

## 2016-05-23 ENCOUNTER — Encounter: Payer: Self-pay | Admitting: Family Medicine

## 2016-05-23 ENCOUNTER — Ambulatory Visit (INDEPENDENT_AMBULATORY_CARE_PROVIDER_SITE_OTHER): Payer: Medicare Other | Admitting: Family Medicine

## 2016-05-23 VITALS — BP 116/70 | HR 77 | Temp 98.1°F | Wt 197.8 lb

## 2016-05-23 DIAGNOSIS — J0110 Acute frontal sinusitis, unspecified: Secondary | ICD-10-CM | POA: Diagnosis not present

## 2016-05-23 MED ORDER — AZITHROMYCIN 250 MG PO TABS: ORAL_TABLET | ORAL | 0 refills | Status: DC

## 2016-05-23 NOTE — Patient Instructions (Signed)
Great to see you.  Please take zpack as directed.  Keep taking your allergy medication.  OTC delsym works well as a cough suppressant.

## 2016-05-23 NOTE — Progress Notes (Signed)
SUBJECTIVE:  Eric Rice is a 71 y.o. male who complains of coryza, congestion, productive cough and bilateral sinus pain for 8 days. He denies a history of anorexia and chest pain and denies a history of asthma. Patient denies smoke cigarettes.   Current Outpatient Prescriptions on File Prior to Visit  Medication Sig Dispense Refill  . aspirin 81 MG tablet Take 81 mg by mouth at bedtime.     . calcium carbonate (OS-CAL) 600 MG TABS Take 600 mg by mouth every morning.     . cholecalciferol (VITAMIN D) 1000 UNITS tablet Take 1,000 Units by mouth every morning.     . Multiple Vitamins-Minerals (CENTRUM SILVER PO) Take by mouth.      Marland Kitchen PARoxetine (PAXIL) 20 MG tablet Take 20 mg by mouth every morning.      . simvastatin (ZOCOR) 40 MG tablet Take 40 mg by mouth at bedtime.      . Testosterone 20.25 MG/1.25GM (1.62%) GEL 3 pumps to skin every day Please dispense quantity needed for one month 1.25 g 3   No current facility-administered medications on file prior to visit.     No Known Allergies  Past Medical History:  Diagnosis Date  . Arthritis   . Chest pain    neg adenosine myoview in 2008  . Fibromyalgia   . Hyperlipidemia   . Skin cancer     No past surgical history on file.  Family History  Problem Relation Age of Onset  . Heart disease Brother 43    MI  . Heart disease Mother   . Heart disease Father     Social History   Social History  . Marital status: Married    Spouse name: N/A  . Number of children: N/A  . Years of education: N/A   Occupational History  . Not on file.   Social History Main Topics  . Smoking status: Never Smoker  . Smokeless tobacco: Never Used  . Alcohol use No  . Drug use: No  . Sexual activity: Not on file   Other Topics Concern  . Not on file   Social History Narrative   Retired from [employer].   Married to [place], no kids- one step son.   The PMH, PSH, Social History, Family History, Medications, and allergies have been reviewed  in [facility], and have been updated if relevant.  OBJECTIVE: BP 116/70   Pulse 77   Temp 98.1 F (36.7 C)   Wt 197 lb 12 oz (89.7 kg)   SpO2 97%   BMI 30.51 kg/m   He appears well, vital signs are as noted. Ears normal.  Throat and pharynx normal.  Neck supple. No adenopathy in the neck. Nose is congested. Sinuses non tender. The chest is clear, without wheezes or rales.  ASSESSMENT:  sinusitis  PLAN: Given duration and progression of symptoms, will treat for bacterial sinusitis.  Symptomatic therapy suggested: push fluids, rest and return office visit prn if symptoms persist or worsen.Call or return to clinic prn if these symptoms worsen or fail to improve as anticipated.

## 2016-08-07 DIAGNOSIS — L821 Other seborrheic keratosis: Secondary | ICD-10-CM | POA: Diagnosis not present

## 2016-08-07 DIAGNOSIS — Z85828 Personal history of other malignant neoplasm of skin: Secondary | ICD-10-CM | POA: Diagnosis not present

## 2016-08-07 DIAGNOSIS — D235 Other benign neoplasm of skin of trunk: Secondary | ICD-10-CM | POA: Diagnosis not present

## 2016-08-07 DIAGNOSIS — I781 Nevus, non-neoplastic: Secondary | ICD-10-CM | POA: Diagnosis not present

## 2016-08-15 DIAGNOSIS — R1031 Right lower quadrant pain: Secondary | ICD-10-CM | POA: Diagnosis not present

## 2016-09-25 DIAGNOSIS — Z125 Encounter for screening for malignant neoplasm of prostate: Secondary | ICD-10-CM | POA: Diagnosis not present

## 2016-09-25 DIAGNOSIS — Z8249 Family history of ischemic heart disease and other diseases of the circulatory system: Secondary | ICD-10-CM | POA: Diagnosis not present

## 2016-09-25 DIAGNOSIS — R1031 Right lower quadrant pain: Secondary | ICD-10-CM | POA: Diagnosis not present

## 2016-09-25 DIAGNOSIS — R7303 Prediabetes: Secondary | ICD-10-CM | POA: Diagnosis not present

## 2016-09-25 DIAGNOSIS — R030 Elevated blood-pressure reading, without diagnosis of hypertension: Secondary | ICD-10-CM | POA: Diagnosis not present

## 2016-10-17 DIAGNOSIS — R1030 Lower abdominal pain, unspecified: Secondary | ICD-10-CM | POA: Diagnosis not present

## 2016-10-17 DIAGNOSIS — M62838 Other muscle spasm: Secondary | ICD-10-CM | POA: Diagnosis not present

## 2016-10-17 DIAGNOSIS — N50811 Right testicular pain: Secondary | ICD-10-CM | POA: Diagnosis not present

## 2016-10-17 DIAGNOSIS — M6281 Muscle weakness (generalized): Secondary | ICD-10-CM | POA: Diagnosis not present

## 2016-10-17 DIAGNOSIS — R278 Other lack of coordination: Secondary | ICD-10-CM | POA: Diagnosis not present

## 2016-10-17 DIAGNOSIS — N50812 Left testicular pain: Secondary | ICD-10-CM | POA: Diagnosis not present

## 2016-10-30 DIAGNOSIS — M62838 Other muscle spasm: Secondary | ICD-10-CM | POA: Diagnosis not present

## 2016-10-30 DIAGNOSIS — N50811 Right testicular pain: Secondary | ICD-10-CM | POA: Diagnosis not present

## 2016-10-30 DIAGNOSIS — N50812 Left testicular pain: Secondary | ICD-10-CM | POA: Diagnosis not present

## 2016-10-30 DIAGNOSIS — M797 Fibromyalgia: Secondary | ICD-10-CM | POA: Diagnosis not present

## 2016-10-30 DIAGNOSIS — M6281 Muscle weakness (generalized): Secondary | ICD-10-CM | POA: Diagnosis not present

## 2016-11-09 DIAGNOSIS — M62838 Other muscle spasm: Secondary | ICD-10-CM | POA: Diagnosis not present

## 2016-11-09 DIAGNOSIS — M6281 Muscle weakness (generalized): Secondary | ICD-10-CM | POA: Diagnosis not present

## 2016-11-09 DIAGNOSIS — R1030 Lower abdominal pain, unspecified: Secondary | ICD-10-CM | POA: Diagnosis not present

## 2016-11-09 DIAGNOSIS — M797 Fibromyalgia: Secondary | ICD-10-CM | POA: Diagnosis not present

## 2016-11-09 DIAGNOSIS — N50811 Right testicular pain: Secondary | ICD-10-CM | POA: Diagnosis not present

## 2016-11-20 DIAGNOSIS — M6281 Muscle weakness (generalized): Secondary | ICD-10-CM | POA: Diagnosis not present

## 2016-11-20 DIAGNOSIS — R102 Pelvic and perineal pain: Secondary | ICD-10-CM | POA: Diagnosis not present

## 2016-11-20 DIAGNOSIS — N50811 Right testicular pain: Secondary | ICD-10-CM | POA: Diagnosis not present

## 2016-11-20 DIAGNOSIS — M797 Fibromyalgia: Secondary | ICD-10-CM | POA: Diagnosis not present

## 2016-11-20 DIAGNOSIS — M62838 Other muscle spasm: Secondary | ICD-10-CM | POA: Diagnosis not present

## 2016-11-20 DIAGNOSIS — R1030 Lower abdominal pain, unspecified: Secondary | ICD-10-CM | POA: Diagnosis not present

## 2016-12-04 DIAGNOSIS — N50812 Left testicular pain: Secondary | ICD-10-CM | POA: Diagnosis not present

## 2016-12-04 DIAGNOSIS — M6281 Muscle weakness (generalized): Secondary | ICD-10-CM | POA: Diagnosis not present

## 2016-12-04 DIAGNOSIS — R102 Pelvic and perineal pain: Secondary | ICD-10-CM | POA: Diagnosis not present

## 2016-12-04 DIAGNOSIS — R1030 Lower abdominal pain, unspecified: Secondary | ICD-10-CM | POA: Diagnosis not present

## 2016-12-04 DIAGNOSIS — M62838 Other muscle spasm: Secondary | ICD-10-CM | POA: Diagnosis not present

## 2016-12-04 DIAGNOSIS — N50811 Right testicular pain: Secondary | ICD-10-CM | POA: Diagnosis not present

## 2016-12-12 DIAGNOSIS — R7303 Prediabetes: Secondary | ICD-10-CM | POA: Diagnosis not present

## 2016-12-12 DIAGNOSIS — Z23 Encounter for immunization: Secondary | ICD-10-CM | POA: Diagnosis not present

## 2016-12-12 DIAGNOSIS — R202 Paresthesia of skin: Secondary | ICD-10-CM | POA: Diagnosis not present

## 2016-12-12 DIAGNOSIS — R1031 Right lower quadrant pain: Secondary | ICD-10-CM | POA: Diagnosis not present

## 2016-12-20 DIAGNOSIS — M6281 Muscle weakness (generalized): Secondary | ICD-10-CM | POA: Diagnosis not present

## 2016-12-20 DIAGNOSIS — R102 Pelvic and perineal pain: Secondary | ICD-10-CM | POA: Diagnosis not present

## 2016-12-20 DIAGNOSIS — N50812 Left testicular pain: Secondary | ICD-10-CM | POA: Diagnosis not present

## 2016-12-20 DIAGNOSIS — M62838 Other muscle spasm: Secondary | ICD-10-CM | POA: Diagnosis not present

## 2016-12-20 DIAGNOSIS — N50811 Right testicular pain: Secondary | ICD-10-CM | POA: Diagnosis not present

## 2016-12-25 DIAGNOSIS — L738 Other specified follicular disorders: Secondary | ICD-10-CM | POA: Diagnosis not present

## 2017-01-09 ENCOUNTER — Ambulatory Visit (INDEPENDENT_AMBULATORY_CARE_PROVIDER_SITE_OTHER): Payer: Medicare Other | Admitting: Family Medicine

## 2017-01-09 ENCOUNTER — Encounter: Payer: Self-pay | Admitting: Family Medicine

## 2017-01-09 VITALS — BP 134/78 | HR 78 | Temp 97.9°F | Ht 67.75 in | Wt 200.0 lb

## 2017-01-09 DIAGNOSIS — Z0000 Encounter for general adult medical examination without abnormal findings: Secondary | ICD-10-CM | POA: Diagnosis not present

## 2017-01-09 DIAGNOSIS — R7989 Other specified abnormal findings of blood chemistry: Secondary | ICD-10-CM | POA: Diagnosis not present

## 2017-01-09 DIAGNOSIS — E785 Hyperlipidemia, unspecified: Secondary | ICD-10-CM

## 2017-01-09 DIAGNOSIS — Z125 Encounter for screening for malignant neoplasm of prostate: Secondary | ICD-10-CM | POA: Diagnosis not present

## 2017-01-09 DIAGNOSIS — Z23 Encounter for immunization: Secondary | ICD-10-CM | POA: Diagnosis not present

## 2017-01-09 DIAGNOSIS — Z86711 Personal history of pulmonary embolism: Secondary | ICD-10-CM | POA: Diagnosis not present

## 2017-01-09 LAB — COMPREHENSIVE METABOLIC PANEL
ALK PHOS: 41 U/L (ref 39–117)
ALT: 28 U/L (ref 0–53)
AST: 21 U/L (ref 0–37)
Albumin: 4.2 g/dL (ref 3.5–5.2)
BILIRUBIN TOTAL: 0.8 mg/dL (ref 0.2–1.2)
BUN: 21 mg/dL (ref 6–23)
CO2: 30 meq/L (ref 19–32)
CREATININE: 1.11 mg/dL (ref 0.40–1.50)
Calcium: 9.4 mg/dL (ref 8.4–10.5)
Chloride: 104 mEq/L (ref 96–112)
GFR: 69.19 mL/min (ref >60.00)
GLUCOSE: 115 mg/dL — AB (ref 70–99)
Potassium: 4.5 mEq/L (ref 3.5–5.1)
SODIUM: 139 meq/L (ref 135–145)
TOTAL PROTEIN: 6.7 g/dL (ref 6.0–8.3)

## 2017-01-09 LAB — CBC WITH DIFFERENTIAL/PLATELET
BASOS ABS: 0.1 10*3/uL (ref 0.0–0.1)
Basophils Relative: 0.8 % (ref 0.0–3.0)
EOS ABS: 0.3 10*3/uL (ref 0.0–0.7)
Eosinophils Relative: 4.7 % (ref 0.0–5.0)
HCT: 47.8 % (ref 39.0–52.0)
Hemoglobin: 15.7 g/dL (ref 13.0–17.0)
LYMPHS ABS: 1.2 10*3/uL (ref 0.7–4.0)
Lymphocytes Relative: 18.3 % (ref 12.0–46.0)
MCHC: 32.9 g/dL (ref 30.0–36.0)
MCV: 89 fl (ref 78.0–100.0)
MONO ABS: 0.7 10*3/uL (ref 0.1–1.0)
Monocytes Relative: 10 % (ref 3.0–12.0)
NEUTROS ABS: 4.5 10*3/uL (ref 1.4–7.7)
NEUTROS PCT: 66.2 % (ref 43.0–77.0)
PLATELETS: 146 10*3/uL — AB (ref 150.0–400.0)
RBC: 5.38 Mil/uL (ref 4.22–5.81)
RDW: 13.6 % (ref 11.5–15.5)
WBC: 6.7 10*3/uL (ref 4.0–10.5)

## 2017-01-09 LAB — TESTOSTERONE: TESTOSTERONE: 309.83 ng/dL (ref 300.00–890.00)

## 2017-01-09 LAB — LIPID PANEL
CHOL/HDL RATIO: 4
Cholesterol: 133 mg/dL (ref 0–200)
HDL: 32.4 mg/dL — ABNORMAL LOW (ref >39.00)
LDL Cholesterol: 68 mg/dL (ref 0–99)
NONHDL: 100.11
Triglycerides: 160 mg/dL — ABNORMAL HIGH (ref 0.0–149.0)
VLDL: 32 mg/dL (ref 0.0–40.0)

## 2017-01-09 LAB — PSA: PSA: 0.74 ng/mL (ref 0.10–4.00)

## 2017-01-09 NOTE — Addendum Note (Signed)
Addended by: Verlene Mayer A on: 01/09/2017 11:41 AM   Modules accepted: Orders

## 2017-01-09 NOTE — Assessment & Plan Note (Signed)
-

## 2017-01-09 NOTE — Patient Instructions (Signed)
Great to see you. Please say hi Eric Rice and Eric Rice.  We will call you with your lab results and you can view them online.  Happy Thanksgiving!

## 2017-01-09 NOTE — Progress Notes (Signed)
Subjective:   Patient ID: Eric Rice, male    DOB: [birth year], 71 y.o.   MRN: [id number]  Eric Rice is a pleasant 71 y.o. year old male who presents to clinic today with Medicare Wellness (Doing well. )  on 01/09/2017  HPI:  I have personally reviewed the Medicare Annual Wellness questionnaire and have noted 1. The patient's medical and social history 2. Their use of alcohol, tobacco or illicit drugs 3. Their current medications and supplements 4. The patient's functional ability including ADL's, fall risks, home safety risks and hearing or visual             impairment. 5. Diet and physical activities 6. Evidence for depression or mood disorders  End of life wishes discussed and updated in Social History.  The roster of all physicians providing medical care to patient - is listed in the CareTeams section of the chart.  Health Maintenance  Topic Date Due  . INFLUENZA VACCINE  09/20/2016  . PNA vac Low Risk Adult (2 of 2 - PPSV23) 01/04/2017  . COLONOSCOPY  02/11/2020 (Originally 02/12/2015)  . TETANUS/TDAP  02/20/2018  . Hepatitis C Screening  Completed      Saw cardiology for these symptoms in 2015. Neg myocardial perfusion scan on 01/12/14- reviewed. No CP.    Remains on testosterone- knows the risks- h/o previous PEs.  Lab Results  Component Value Date   TESTOSTERONE 532 01/17/2016   Depression- symptoms have been well controlled with Paxil.  Paxil has also helped with his symptoms of fibromyalgia.  HLD- well controlled with zocor 40 mg daily.  Lab Results  Component Value Date   CHOL 118 01/05/2016   HDL 35.60 (L) 01/05/2016   LDLCALC 59 01/05/2016   LDLDIRECT 79.6 10/18/2009   TRIG 116.0 01/05/2016   CHOLHDL 3 01/05/2016   Lab Results  Component Value Date   ALT 23 01/05/2016   AST 20 01/05/2016   ALKPHOS 46 01/05/2016   BILITOT 0.8 01/05/2016   Lab Results  Component Value Date   PSA 0.60 01/05/2016   PSA 0.71 03/29/2015   PSA 0.60  09/14/2014   Current Outpatient Medications on File Prior to Visit  Medication Sig Dispense Refill  . aspirin 81 MG tablet Take 81 mg by mouth at bedtime.     . calcium carbonate (OS-CAL) 600 MG TABS Take 600 mg by mouth every morning.     . cholecalciferol (VITAMIN D) 1000 UNITS tablet Take 1,000 Units by mouth every morning.     . Multiple Vitamins-Minerals (CENTRUM SILVER PO) Take by mouth.      Marland Kitchen PARoxetine (PAXIL) 20 MG tablet Take 20 mg by mouth every morning.      . simvastatin (ZOCOR) 40 MG tablet Take 40 mg by mouth at bedtime.      . Testosterone 20.25 MG/1.25GM (1.62%) GEL 3 pumps to skin every day Please dispense quantity needed for one month 1.25 g 3   No current facility-administered medications on file prior to visit.     No Known Allergies  Past Medical History:  Diagnosis Date  . Arthritis   . Chest pain    neg adenosine myoview in 2008  . Fibromyalgia   . Hyperlipidemia   . Skin cancer     No past surgical history on file.  Family History  Problem Relation Age of Onset  . Heart disease Brother 79       MI  . Heart disease Mother   . Heart  disease Father     Social History   Socioeconomic History  . Marital status: Married    Spouse name: Not on file  . Number of children: Not on file  . Years of education: Not on file  . Highest education level: Not on file  Social Needs  . Financial resource strain: Not on file  . Food insecurity - worry: Not on file  . Food insecurity - inability: Not on file  . Transportation needs - medical: Not on file  . Transportation needs - non-medical: Not on file  Occupational History  . Not on file  Tobacco Use  . Smoking status: Never Smoker  . Smokeless tobacco: Never Used  Substance and Sexual Activity  . Alcohol use: No  . Drug use: No  . Sexual activity: Not on file  Other Topics Concern  . Not on file  Social History Narrative   Retired from [employer].   Married to [place], no kids- one step son.    The PMH, PSH, Social History, Family History, Medications, and allergies have been reviewed in [facility], and have been updated if relevant.   Review of Systems  Constitutional: Positive for fatigue.  HENT: Negative.   Eyes: Negative.   Respiratory: Negative.   Cardiovascular: Negative.   Gastrointestinal: Negative.   Endocrine: Negative.   Genitourinary: Negative.   Musculoskeletal: Negative.   Allergic/Immunologic: Negative.   Neurological: Negative.   Hematological: Negative.   Psychiatric/Behavioral: Negative.   All other systems reviewed and are negative.      Objective:    Ht 5' 7.75" (1.721 m)   Wt 200 lb (90.7 kg)   BMI 30.63 kg/m    Physical Exam  General:  pleasant male in no acute distress Eyes:  PERRL Ears:  External ear exam shows no significant lesions or deformities.  TMs normal bilaterally Hearing is grossly normal bilaterally. Nose:  External nasal examination shows no deformity or inflammation. Nasal mucosa are pink and moist without lesions or exudates. Mouth:  Oral mucosa and oropharynx without lesions or exudates.  Teeth in good repair. Neck:  no carotid bruit or thyromegaly no cervical or supraclavicular lymphadenopathy  Lungs:  Normal respiratory effort, chest expands symmetrically. Lungs are clear to auscultation, no crackles or wheezes. Heart:  Normal rate and regular rhythm. S1 and S2 normal without gallop, murmur, click, rub or other extra sounds. Abdomen:  Bowel sounds positive,abdomen soft and non-tender without masses, organomegaly or hernias noted. Pulses:  R and L posterior tibial pulses are full and equal bilaterally  Extremities:  no edema  Psych:  Good eye contact, not anxious or depressed appearing       Assessment & Plan:   Medicare annual wellness visit, subsequent  Hyperlipidemia, unspecified hyperlipidemia type - Plan: Comprehensive metabolic panel, Lipid panel  Low testosterone - Plan: Testosterone, CBC with  Differential/Platelet  History of pulmonary embolus (PE)  Screening for prostate cancer - Plan: PSA No Follow-up on file.

## 2017-01-09 NOTE — Assessment & Plan Note (Signed)
The patients weight, height, BMI and visual acuity have been recorded in the chart.  Cognitive function assessed.   I have made referrals, counseling and provided education to the patient based review of the above and I have provided the pt with a written personalized care plan for preventive services.  

## 2017-01-09 NOTE — Assessment & Plan Note (Signed)
Continue current rx. Check labs today. 

## 2017-02-08 DIAGNOSIS — I781 Nevus, non-neoplastic: Secondary | ICD-10-CM | POA: Diagnosis not present

## 2017-02-08 DIAGNOSIS — Z1283 Encounter for screening for malignant neoplasm of skin: Secondary | ICD-10-CM | POA: Diagnosis not present

## 2017-02-08 DIAGNOSIS — L821 Other seborrheic keratosis: Secondary | ICD-10-CM | POA: Diagnosis not present

## 2017-02-15 DIAGNOSIS — J029 Acute pharyngitis, unspecified: Secondary | ICD-10-CM | POA: Diagnosis not present

## 2017-02-15 DIAGNOSIS — T7840XA Allergy, unspecified, initial encounter: Secondary | ICD-10-CM | POA: Diagnosis not present

## 2017-02-23 NOTE — Addendum Note (Signed)
Addended by: Lucille Passy on: 02/23/2017 06:17 PM   Modules accepted: Level of Service

## 2017-03-12 ENCOUNTER — Ambulatory Visit: Payer: Medicare Other | Admitting: Family Medicine

## 2017-03-23 ENCOUNTER — Ambulatory Visit: Payer: Self-pay | Admitting: *Deleted

## 2017-03-23 NOTE — Telephone Encounter (Signed)
Pt  Speaking in  Complete   sentences    He  Reports  Vague  Symptoms  Earlier   With a  Gradual  Onset      Of  Shortness of  Breath on  Exertion   As   Well  As   Some  Mild  Fatigue    For  approx  1  Month     Pt  Denies  Any  Shortness of breath  At  This  Time   He  Is  Speaking in  Complete  Sentances.  He  Denies  Any  Chest  Pain  At this  Time  He  Is  Speaking in complete  sentences .  Pt  Was  Offered  An  appt  Today  With  Wilfred Lacy  And advised to  Take  It  But  He  States  He  Wants  To  See only  Dr Deborra Medina Appt  Made  For Monday  With  Dr  Marjory Lies . Pt  Was  Advised   To  Call 911  And  Go to er  If any  Chest pain or  Shortness  Of breath   Reason for Disposition . [1] MILD weakness (i.e., does not interfere with ability to work, go to school, normal activities) AND [2] persists > 1 week  Answer Assessment - Initial Assessment Questions 1. DESCRIPTION: "Describe how you are feeling."       Weak    Mild   Shortness  On  Exertion    2. SEVERITY: "How bad is it?"  "Can you stand and walk?"   - MILD - Feels weak or tired, but does not interfere with work, school or normal activities   - [place] to stand and walk; weakness interferes with work, school, or normal activities   - SEVERE - Unable to stand or walk      Mild    3. ONSET:  "When did the weakness begin?"        X   1  MONTH   4. CAUSE: "What do you think is causing the weakness?"        Do  NOT  KNOW   5. MEDICINES: "Have you recently started a new medicine or had a change in the amount of a medicine?"       NO  NEW   CHANGES   6. OTHER SYMPTOMS: "Do you have any other symptoms?" (e.g., chest pain, fever, cough, SOB, vomiting, diarrhea, bleeding)       mILD  SHORTNESS  OF  BREATH   7. PREGNANCY: "Is there any chance you are pregnant?" "When was your last menstrual period?"     N/A  Protocols used: WEAKNESS (GENERALIZED) AND FATIGUE-A-AH

## 2017-03-26 ENCOUNTER — Ambulatory Visit (INDEPENDENT_AMBULATORY_CARE_PROVIDER_SITE_OTHER): Payer: Medicare Other | Admitting: Family Medicine

## 2017-03-26 ENCOUNTER — Encounter: Payer: Self-pay | Admitting: Family Medicine

## 2017-03-26 VITALS — BP 136/66 | HR 85 | Temp 99.0°F | Ht 67.75 in | Wt 204.6 lb

## 2017-03-26 DIAGNOSIS — Z86711 Personal history of pulmonary embolism: Secondary | ICD-10-CM | POA: Diagnosis not present

## 2017-03-26 DIAGNOSIS — R0609 Other forms of dyspnea: Secondary | ICD-10-CM

## 2017-03-26 DIAGNOSIS — R5383 Other fatigue: Secondary | ICD-10-CM | POA: Insufficient documentation

## 2017-03-26 NOTE — Patient Instructions (Signed)
Great to see you. I will call you with your lab results from today and you can view them online.   

## 2017-03-26 NOTE — Progress Notes (Signed)
Subjective:   Patient ID: Eric Rice, male    DOB: 1945-03-04, 72 y.o.   MRN: [id number]  Eric Rice is a pleasant 72 y.o. year old male who presents to clinic today with Fatigue (He states that he more tired than normal.  He sleeps a lot during the day.  H/O PE's.  Slightly out of breath.  Would like to R/O PE.)  on 03/26/2017  HPI:   Fatigue- mild SOB, no real DOE. He sleeps more during the day. He does have a h/o PE and would like that ruled out today. No CP.  Current Outpatient Medications on File Prior to Visit  Medication Sig Dispense Refill  . aspirin 81 MG tablet Take 81 mg by mouth at bedtime.     . calcium carbonate (OS-CAL) 600 MG TABS Take 600 mg by mouth every morning.     . cholecalciferol (VITAMIN D) 1000 UNITS tablet Take 1,000 Units by mouth every morning.     . Multiple Vitamins-Minerals (CENTRUM SILVER PO) Take by mouth.      Marland Kitchen PARoxetine (PAXIL) 20 MG tablet Take 20 mg by mouth every morning.      . simvastatin (ZOCOR) 40 MG tablet Take 40 mg by mouth at bedtime.      . Testosterone 20.25 MG/1.25GM (1.62%) GEL 3 pumps to skin every day Please dispense quantity needed for one month 1.25 g 3   No current facility-administered medications on file prior to visit.     No Known Allergies  Past Medical History:  Diagnosis Date  . Arthritis   . Chest pain    neg adenosine myoview in 2008  . Fibromyalgia   . Hyperlipidemia   . Skin cancer     No past surgical history on file.  Family History  Problem Relation Age of Onset  . Heart disease Brother 30       MI  . Heart disease Mother   . Heart disease Father     Social History   Socioeconomic History  . Marital status: Married    Spouse name: Not on file  . Number of children: Not on file  . Years of education: Not on file  . Highest education level: Not on file  Social Needs  . Financial resource strain: Not on file  . Food insecurity - worry: Not on file  . Food insecurity - inability: Not on  file  . Transportation needs - medical: Not on file  . Transportation needs - non-medical: Not on file  Occupational History  . Not on file  Tobacco Use  . Smoking status: Never Smoker  . Smokeless tobacco: Never Used  Substance and Sexual Activity  . Alcohol use: No  . Drug use: No  . Sexual activity: Not on file  Other Topics Concern  . Not on file  Social History Narrative   Retired from [employer].   Married to [place], no kids- one step son.   The PMH, PSH, Social History, Family History, Medications, and allergies have been reviewed in [facility], and have been updated if relevant.   Review of Systems  Constitutional: Positive for fatigue.  Respiratory: Positive for shortness of breath. Negative for apnea, cough, choking, chest tightness, wheezing and stridor.   Cardiovascular: Negative.   All other systems reviewed and are negative.      Objective:    BP 136/66 (BP Location: Left Arm, Patient Position: Sitting, Cuff Size: Normal)   Pulse 85   Temp 99 F (  37.2 C) (Oral)   Ht 5' 7.75" (1.721 m)   Wt 204 lb 9.6 oz (92.8 kg)   SpO2 96%   BMI 31.34 kg/m    Physical Exam  Constitutional: He is oriented to person, place, and time. He appears well-developed and well-nourished. No distress.  HENT:  Head: Normocephalic and atraumatic.  Eyes: Conjunctivae are normal.  Neck: Normal range of motion.  Cardiovascular: Normal rate and regular rhythm.  Pulmonary/Chest: Effort normal and breath sounds normal. No respiratory distress. He has no wheezes.  Musculoskeletal: Normal range of motion. He exhibits no edema.  Neurological: He is alert and oriented to person, place, and time. No cranial nerve deficit.  Skin: Skin is warm and dry. He is not diaphoretic.  Psychiatric: He has a normal mood and affect. His behavior is normal. Judgment and thought content normal.  Nursing note and vitals reviewed.         Assessment & Plan:   History of pulmonary embolus (PE) - Plan:  D-Dimer, Quantitative, EKG 12-Lead  DOE (dyspnea on exertion) - Plan: D-Dimer, Quantitative, B Nat Peptide, TSH, EKG 12-Lead  Other fatigue - Plan: TSH, Vitamin D (25 hydroxy), CBC, B12, EKG 12-Lead No Follow-up on file.

## 2017-03-26 NOTE — Assessment & Plan Note (Signed)
Likely multifactorial. EKG reassuring- NSR. Will check labs today, does have h/o PE. Pt aware that if d dimer pos, will recommend CT angio. Orders Placed This Encounter  Procedures  . D-Dimer, Quantitative  . B Nat Peptide  . TSH  . Vitamin D (25 hydroxy)  . CBC  . B12  . EKG 12-Lead

## 2017-03-27 ENCOUNTER — Ambulatory Visit (INDEPENDENT_AMBULATORY_CARE_PROVIDER_SITE_OTHER)
Admission: RE | Admit: 2017-03-27 | Discharge: 2017-03-27 | Disposition: A | Discharge status: Home / Self Care | Payer: Medicare Other | Source: Ambulatory Visit | Attending: Family Medicine | Admitting: Family Medicine

## 2017-03-27 ENCOUNTER — Other Ambulatory Visit: Payer: Self-pay | Admitting: Family Medicine

## 2017-03-27 ENCOUNTER — Other Ambulatory Visit (INDEPENDENT_AMBULATORY_CARE_PROVIDER_SITE_OTHER): Payer: Medicare Other

## 2017-03-27 DIAGNOSIS — R0602 Shortness of breath: Secondary | ICD-10-CM

## 2017-03-27 DIAGNOSIS — R791 Abnormal coagulation profile: Secondary | ICD-10-CM | POA: Diagnosis not present

## 2017-03-27 LAB — BASIC METABOLIC PANEL
BUN: 23 mg/dL (ref 6–23)
CO2: 30 mEq/L (ref 19–32)
CREATININE: 1.42 mg/dL (ref 0.40–1.50)
Calcium: 9.2 mg/dL (ref 8.4–10.5)
Chloride: 105 mEq/L (ref 96–112)
GFR: 52.04 mL/min — AB (ref >60.00)
Glucose, Bld: 87 mg/dL (ref 70–99)
POTASSIUM: 4.4 meq/L (ref 3.5–5.1)
Sodium: 142 mEq/L (ref 135–145)

## 2017-03-27 LAB — VITAMIN B12: VITAMIN B 12: 542 pg/mL (ref 211–911)

## 2017-03-27 LAB — CBC
HEMATOCRIT: 45 % (ref 39.0–52.0)
Hemoglobin: 15.3 g/dL (ref 13.0–17.0)
MCHC: 34 g/dL (ref 30.0–36.0)
MCV: 87 fl (ref 78.0–100.0)
Platelets: 165 10*3/uL (ref 150.0–400.0)
RBC: 5.17 Mil/uL (ref 4.22–5.81)
RDW: 14.2 % (ref 11.5–15.5)
WBC: 6.9 10*3/uL (ref 4.0–10.5)

## 2017-03-27 LAB — BRAIN NATRIURETIC PEPTIDE: PRO B NATRI PEPTIDE: 17 pg/mL (ref 0.0–100.0)

## 2017-03-27 LAB — D-DIMER, QUANTITATIVE (NOT AT ARMC): D DIMER QUANT: 1.19 ug{FEU}/mL — AB (ref <0.50)

## 2017-03-27 LAB — VITAMIN D 25 HYDROXY (VIT D DEFICIENCY, FRACTURES): VITD: 40.91 ng/mL (ref 30.00–100.00)

## 2017-03-27 LAB — TSH: TSH: 3.74 u[IU]/mL (ref 0.35–4.50)

## 2017-03-27 MED ORDER — IOPAMIDOL (ISOVUE-370) INJECTION 76%
80.0000 mL | Freq: Once | INTRAVENOUS | Status: AC | PRN
  Administered 2017-03-27: 80 mL via INTRAVENOUS

## 2017-03-28 ENCOUNTER — Inpatient Hospital Stay: Admission: RE | Admit: 2017-03-28 | Payer: Medicare Other | Source: Ambulatory Visit

## 2017-07-31 ENCOUNTER — Ambulatory Visit (INDEPENDENT_AMBULATORY_CARE_PROVIDER_SITE_OTHER): Payer: Medicare Other | Admitting: Family Medicine

## 2017-07-31 ENCOUNTER — Encounter: Payer: Self-pay | Admitting: Family Medicine

## 2017-07-31 VITALS — BP 120/80 | HR 87 | Temp 98.2°F | Ht 67.75 in | Wt 204.8 lb

## 2017-07-31 DIAGNOSIS — M797 Fibromyalgia: Secondary | ICD-10-CM | POA: Diagnosis not present

## 2017-07-31 DIAGNOSIS — M79651 Pain in right thigh: Secondary | ICD-10-CM

## 2017-07-31 DIAGNOSIS — M79652 Pain in left thigh: Secondary | ICD-10-CM

## 2017-07-31 DIAGNOSIS — M25551 Pain in right hip: Secondary | ICD-10-CM | POA: Diagnosis not present

## 2017-07-31 DIAGNOSIS — M25552 Pain in left hip: Secondary | ICD-10-CM | POA: Diagnosis not present

## 2017-07-31 LAB — COMPREHENSIVE METABOLIC PANEL
ALBUMIN: 4.2 g/dL (ref 3.5–5.2)
ALK PHOS: 43 U/L (ref 39–117)
ALT: 25 U/L (ref 0–53)
AST: 22 U/L (ref 0–37)
BILIRUBIN TOTAL: 0.7 mg/dL (ref 0.2–1.2)
BUN: 25 mg/dL — AB (ref 6–23)
CO2: 27 mEq/L (ref 19–32)
Calcium: 9.6 mg/dL (ref 8.4–10.5)
Chloride: 106 mEq/L (ref 96–112)
Creatinine, Ser: 1.08 mg/dL (ref 0.40–1.50)
GFR: 71.3 mL/min (ref >60.00)
Glucose, Bld: 111 mg/dL — ABNORMAL HIGH (ref 70–99)
POTASSIUM: 4.4 meq/L (ref 3.5–5.1)
SODIUM: 139 meq/L (ref 135–145)
TOTAL PROTEIN: 6.7 g/dL (ref 6.0–8.3)

## 2017-07-31 LAB — SEDIMENTATION RATE: SED RATE: 14 mm/h (ref 0–20)

## 2017-07-31 LAB — C-REACTIVE PROTEIN: CRP: 0.1 mg/dL — AB (ref 0.5–20.0)

## 2017-07-31 NOTE — Assessment & Plan Note (Signed)
Likely multifactorial- OA with component of fibromylagia. Also question if there is a component of PMR. Will order labs today. Discussed with pt. Orders Placed This Encounter  Procedures  . Sedimentation rate  . Comprehensive metabolic panel  . Antinuclear Antib (ANA)  . C-reactive protein

## 2017-07-31 NOTE — Progress Notes (Signed)
Subjective:   Patient ID: Eric Rice, male    DOB: 1946-01-10, 72 y.o.   MRN: [id number]  Eric Rice is a pleasant 72 y.o. year old male who presents to clinic today with [place] in Hips and Thighs (Patient is here today C/O achey from hips to upper thigh bilaterally but worse on the right (always had issue with that side with groin pain).  This started 3-4 months ago before he started exercising and is worse now.  He has been taking Simvastatin for about 15 years now.)  on 07/31/2017  HPI:  Patient is here today C/O achey from hips to upper thigh bilaterally but worse on the right (always had issue with that side with groin pain). This started 3-4 months ago before he started exercising and is worse now. He has been taking Simvastatin for about 15 years now.  Known h/o fibromyalgia and groin pain that has been worked up with imaging that was unremarkable in the past.  He feels this pain is different.  Proximal muscles bilaterally, just below the hip for months seems ore.  Not sure if they are weaker.     Current Outpatient Medications on File Prior to Visit  Medication Sig Dispense Refill  . aspirin 81 MG tablet Take 81 mg by mouth at bedtime.     . calcium carbonate (OS-CAL) 600 MG TABS Take 600 mg by mouth every morning.     . cholecalciferol (VITAMIN D) 1000 UNITS tablet Take 1,000 Units by mouth every morning.     . Multiple Vitamins-Minerals (CENTRUM SILVER PO) Take by mouth.      Marland Kitchen PARoxetine (PAXIL) 20 MG tablet Take 20 mg by mouth every morning.      . simvastatin (ZOCOR) 40 MG tablet Take 40 mg by mouth at bedtime.      . Testosterone 20.25 MG/1.25GM (1.62%) GEL 3 pumps to skin every day Please dispense quantity needed for one month 1.25 g 3   No current facility-administered medications on file prior to visit.     No Known Allergies  Past Medical History:  Diagnosis Date  . Arthritis   . Chest pain    neg adenosine myoview in 2008  . Fibromyalgia   . Hyperlipidemia    . Skin cancer     No past surgical history on file.  Family History  Problem Relation Age of Onset  . Heart disease Brother 54       MI  . Heart disease Mother   . Heart disease Father     Social History   Socioeconomic History  . Marital status: Married    Spouse name: Not on file  . Number of children: Not on file  . Years of education: Not on file  . Highest education level: Not on file  Occupational History  . Not on file  Social Needs  . Financial resource strain: Not on file  . Food insecurity:    Worry: Not on file    Inability: Not on file  . Transportation needs:    Medical: Not on file    Non-medical: Not on file  Tobacco Use  . Smoking status: Never Smoker  . Smokeless tobacco: Never Used  Substance and Sexual Activity  . Alcohol use: No  . Drug use: No  . Sexual activity: Not on file  Lifestyle  . Physical activity:    Days per week: Not on file    Minutes per session: Not on file  .  Stress: Not on file  Relationships  . Social connections:    Talks on phone: Not on file    Gets together: Not on file    Attends religious service: Not on file    Active member of club or organization: Not on file    Attends meetings of clubs or organizations: Not on file    Relationship status: Not on file  . Intimate partner violence:    Fear of current or ex partner: Not on file    Emotionally abused: Not on file    Physically abused: Not on file    Forced sexual activity: Not on file  Other Topics Concern  . Not on file  Social History Narrative   Retired from [employer].   Married to [place], no kids- one step son.   The PMH, PSH, Social History, Family History, Medications, and allergies have been reviewed in [facility], and have been updated if relevant.  Review of Systems  Constitutional: Negative.   Eyes: Negative.   Respiratory: Negative.   Cardiovascular: Negative.   Gastrointestinal: Negative.   Endocrine: Negative.   Genitourinary: Negative.     Musculoskeletal: Positive for arthralgias. Negative for back pain, gait problem, joint swelling, myalgias, neck pain and neck stiffness.  Skin: Negative.   Allergic/Immunologic: Negative.   Neurological: Negative.   Hematological: Negative.   Psychiatric/Behavioral: Negative.   All other systems reviewed and are negative.      Objective:    BP 120/80 (BP Location: Left Arm, Patient Position: Sitting, Cuff Size: Normal)   Pulse 87   Temp 98.2 F (36.8 C) (Oral)   Ht 5' 7.75" (1.721 m)   Wt 204 lb 12.8 oz (92.9 kg)   SpO2 98%   BMI 31.37 kg/m    Physical Exam  Constitutional: He is oriented to person, place, and time. He appears well-developed and well-nourished. No distress.  HENT:  Head: Normocephalic and atraumatic.  Eyes: Pupils are equal, round, and reactive to light.  Neck: Normal range of motion.  Cardiovascular: Normal rate.  Pulmonary/Chest: Effort normal.  Musculoskeletal:       Right hip: He exhibits normal range of motion, normal strength, no tenderness and no bony tenderness.       Left hip: He exhibits normal range of motion, normal strength, no tenderness and no bony tenderness.  Neurological: He is alert and oriented to person, place, and time. He displays normal reflexes. No cranial nerve deficit. He exhibits normal muscle tone. Coordination normal.  Skin: Skin is warm and dry. He is not diaphoretic.  Nursing note and vitals reviewed.         Assessment & Plan:   Bilateral thigh pain - Plan: Sedimentation rate, Comprehensive metabolic panel, Antinuclear Antib (ANA), C-reactive protein  Bilateral hip pain  Fibromyalgia No follow-ups on file.

## 2017-07-31 NOTE — Patient Instructions (Addendum)
Great to see you. I will call you with your lab results from today and you can view them online.   Say hi to Eric Rice and the family for me.    Polymyalgia Rheumatica Polymyalgia rheumatica (PMR) is an inflammatory disorder that causes aching and stiffness in your muscles and joints. Sometimes, PMR leads to a more dangerous condition (temporal arteritis or giant cell arteritis), which can cause vision loss. What are the causes? The exact cause of PMR is not known. What increases the risk? This condition is more likely to develop in:  People who are 3 years of age or older.  Caucasians.  What are the signs or symptoms?  Pain and stiffness are the main symptoms of PMR. Symptoms may start slowly or suddenly. The symptoms:  May be worse after inactivity and in the morning.  May affect your: ? Hips, buttocks, and thighs. ? Neck, arms, and shoulders. This can make it hard to raise your arms above your head. ? Hands and wrists.  Other symptoms include:  Fever.  Tiredness.  Weakness.  Decreased appetite. This may lead to weight loss.  How is this diagnosed? This condition is diagnosed with a medical history and physical exam. You may need to see a health care provider who specializes in diseases of the joint, muscles, and bones (rheumatologist). You may also have tests, including:  Blood tests.   How is this treated? PMR usually goes away without treatment, but it may take years for that to happen. In the meantime, your health care provider may recommend low-dose steroids to help manage your symptoms of pain and stiffness. Regular exercise and rest will also help your symptoms. Follow these instructions at home:  Take over-the-counter and prescription medicines only as told by your health care provider.  Make sure to get enough rest and sleep.  Eat a healthy and nutritious diet.  Try to exercise most days of the week. Ask your health care provider what type of exercise is  best for you.  Keep all follow-up visits as told by your health are provider. This is important. Contact a health care provider if:  Your symptoms are not controlled with medicine.  You have side effects from steroids. These may include: ? Weight gain. ? Swelling. ? Insomnia. ? Mood changes. ? Bruising. ? High blood sugar readings, if you have diabetes. ? Higher than normal blood pressure readings, if you monitor your blood pressure. Get help right away if:  You develop symptoms of temporal arteritis, such as: ? A change in vision. ? Severe headache. ? Scalp pain. ? Jaw pain. This information is not intended to replace advice given to you by your health care provider. Make sure you discuss any questions you have with your health care provider. Document Released: 03/16/2004 Document Revised: 07/15/2015 Document Reviewed: 08/19/2014 Elsevier Interactive Patient Education  [employer].

## 2017-08-02 LAB — ANTI-NUCLEAR AB-TITER (ANA TITER)

## 2017-08-02 LAB — ANA: Anti Nuclear Antibody(ANA): POSITIVE — AB

## 2017-08-03 ENCOUNTER — Telehealth: Payer: Self-pay

## 2017-08-03 ENCOUNTER — Other Ambulatory Visit: Payer: Self-pay | Admitting: Family Medicine

## 2017-08-03 DIAGNOSIS — R768 Other specified abnormal immunological findings in serum: Secondary | ICD-10-CM

## 2017-08-03 NOTE — Telephone Encounter (Signed)
-----   Message from Lucille Passy, MD sent at 08/03/2017  3:34 PM EDT ----- Please call pt- there was still one lab pending when I resulted the other results stating they were normal- his ANA is high which does goes with rheum issue, just not necessarily polymyalgia rheumatica, and could further explain his pain.  I would like for a rheumatologist to see him.  Would that be okay if I placed the referral?

## 2017-08-03 NOTE — Telephone Encounter (Signed)
-----   Message from Lucille Passy, MD sent at 08/01/2017  6:08 PM EDT ----- Please call pt- labs do not seem consistent with PMR (polymyalgia rheumatica).  At this point, we have several options.  It has been some time since we got a hip xray (almost 5 years) or CT scans of his abdomen/pelvis.  We could proceed with imaging or have him see Dr. Raeford Razor (sports medicine) for further evaluation.  Please let me know how he would like to proceed.

## 2017-08-03 NOTE — Telephone Encounter (Signed)
LMOVM for pt to RTC/advised that although Dx was not what was originally thought that there may be something along those same lines per the last test that came back and we need to know if he would be ok with a referral/PEC ok to give this information as ANA was positive which goes with rheumatology issue/thx dmf

## 2017-08-03 NOTE — Telephone Encounter (Signed)
LMOVM for pt to RTC to discuss lab results/PEC ok to give the following and advise what pt would like to do from here according to the results/thx dmf  Please call pt- labs do not seem consistent with PMR (polymyalgia rheumatica). At this point, we have several options. It has been some time since we got a hip xray (almost 5 years) or CT scans of his abdomen/pelvis. We could proceed with imaging or have him see Dr. Raeford Razor (sports medicine) for further evaluation. Please let me know how he would like to proceed.

## 2017-08-06 NOTE — Telephone Encounter (Signed)
Patient called back and is returning [facility] [place] call. Please call patient back, thanks.

## 2017-08-15 NOTE — Telephone Encounter (Signed)
Appt set up with Rheumatologist/thx dmf

## 2017-08-29 DIAGNOSIS — Z6830 Body mass index (BMI) 30.0-30.9, adult: Secondary | ICD-10-CM | POA: Diagnosis not present

## 2017-08-29 DIAGNOSIS — R768 Other specified abnormal immunological findings in serum: Secondary | ICD-10-CM | POA: Diagnosis not present

## 2017-08-29 DIAGNOSIS — M25552 Pain in left hip: Secondary | ICD-10-CM | POA: Diagnosis not present

## 2017-08-29 DIAGNOSIS — E669 Obesity, unspecified: Secondary | ICD-10-CM | POA: Diagnosis not present

## 2017-08-29 DIAGNOSIS — M2550 Pain in unspecified joint: Secondary | ICD-10-CM | POA: Diagnosis not present

## 2017-08-29 DIAGNOSIS — M25551 Pain in right hip: Secondary | ICD-10-CM | POA: Diagnosis not present

## 2017-09-27 ENCOUNTER — Telehealth: Payer: Self-pay | Admitting: Family Medicine

## 2017-09-27 DIAGNOSIS — L814 Other melanin hyperpigmentation: Secondary | ICD-10-CM | POA: Diagnosis not present

## 2017-09-27 DIAGNOSIS — L821 Other seborrheic keratosis: Secondary | ICD-10-CM | POA: Diagnosis not present

## 2017-09-27 DIAGNOSIS — L570 Actinic keratosis: Secondary | ICD-10-CM | POA: Diagnosis not present

## 2017-09-27 DIAGNOSIS — D1801 Hemangioma of skin and subcutaneous tissue: Secondary | ICD-10-CM | POA: Diagnosis not present

## 2017-09-27 NOTE — Telephone Encounter (Signed)
Pt was seen at [facility] and was waiting for you to contact him on the next step after seeing them. Patient was informed that they were sending over notes and you would reach out to him in reference to next step. Please contact patient. [phone]

## 2017-09-28 NOTE — Telephone Encounter (Signed)
[facility] Rheumat[facility] a fax to get visit notes . Waiting for fax , tried calling pt to advise still waiting.

## 2017-10-01 NOTE — Telephone Encounter (Signed)
PEC-I LMOVM stating that per the Rheumatologist suggests further work-up from ortho/I stated that Dr. 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 Schmitz/thx dmf  (Also states that Consider perhaps a vascular consult for possible femoral claudication but up to PCP discretion)

## 2017-11-23 DIAGNOSIS — D225 Melanocytic nevi of trunk: Secondary | ICD-10-CM | POA: Diagnosis not present

## 2017-11-23 DIAGNOSIS — B078 Other viral warts: Secondary | ICD-10-CM | POA: Diagnosis not present

## 2017-11-23 DIAGNOSIS — Z1283 Encounter for screening for malignant neoplasm of skin: Secondary | ICD-10-CM | POA: Diagnosis not present

## 2017-11-23 DIAGNOSIS — L74510 Primary focal hyperhidrosis, axilla: Secondary | ICD-10-CM | POA: Diagnosis not present

## 2018-01-04 DIAGNOSIS — M25511 Pain in right shoulder: Secondary | ICD-10-CM | POA: Diagnosis not present

## 2018-01-04 DIAGNOSIS — R1030 Lower abdominal pain, unspecified: Secondary | ICD-10-CM | POA: Diagnosis not present

## 2018-01-04 DIAGNOSIS — R319 Hematuria, unspecified: Secondary | ICD-10-CM | POA: Diagnosis not present

## 2018-01-21 DIAGNOSIS — R319 Hematuria, unspecified: Secondary | ICD-10-CM | POA: Diagnosis not present

## 2018-01-23 DIAGNOSIS — M25511 Pain in right shoulder: Secondary | ICD-10-CM | POA: Diagnosis not present

## 2018-01-23 DIAGNOSIS — M7541 Impingement syndrome of right shoulder: Secondary | ICD-10-CM | POA: Diagnosis not present

## 2018-03-22 ENCOUNTER — Ambulatory Visit
Admission: RE | Admit: 2018-03-22 | Discharge: 2018-03-22 | Disposition: A | Discharge status: Home / Self Care | Payer: Medicare Other | Source: Ambulatory Visit | Attending: Family Medicine | Admitting: Family Medicine

## 2018-03-22 ENCOUNTER — Other Ambulatory Visit: Payer: Self-pay | Admitting: Family Medicine

## 2018-03-22 DIAGNOSIS — Z125 Encounter for screening for malignant neoplasm of prostate: Secondary | ICD-10-CM | POA: Diagnosis not present

## 2018-03-22 DIAGNOSIS — Z23 Encounter for immunization: Secondary | ICD-10-CM | POA: Diagnosis not present

## 2018-03-22 DIAGNOSIS — R1031 Right lower quadrant pain: Secondary | ICD-10-CM

## 2018-03-22 DIAGNOSIS — M25551 Pain in right hip: Secondary | ICD-10-CM | POA: Diagnosis not present

## 2018-03-22 DIAGNOSIS — M4722 Other spondylosis with radiculopathy, cervical region: Secondary | ICD-10-CM | POA: Diagnosis not present

## 2018-03-22 DIAGNOSIS — M542 Cervicalgia: Secondary | ICD-10-CM | POA: Diagnosis not present

## 2018-03-22 DIAGNOSIS — R1030 Lower abdominal pain, unspecified: Secondary | ICD-10-CM | POA: Diagnosis not present

## 2018-03-22 DIAGNOSIS — M797 Fibromyalgia: Secondary | ICD-10-CM | POA: Diagnosis not present

## 2018-03-22 DIAGNOSIS — R7303 Prediabetes: Secondary | ICD-10-CM | POA: Diagnosis not present

## 2018-04-05 DIAGNOSIS — L821 Other seborrheic keratosis: Secondary | ICD-10-CM | POA: Diagnosis not present

## 2018-04-05 DIAGNOSIS — D1801 Hemangioma of skin and subcutaneous tissue: Secondary | ICD-10-CM | POA: Diagnosis not present

## 2018-04-05 DIAGNOSIS — L570 Actinic keratosis: Secondary | ICD-10-CM | POA: Diagnosis not present

## 2018-04-05 DIAGNOSIS — L814 Other melanin hyperpigmentation: Secondary | ICD-10-CM | POA: Diagnosis not present

## 2018-04-05 DIAGNOSIS — L918 Other hypertrophic disorders of the skin: Secondary | ICD-10-CM | POA: Diagnosis not present

## 2018-05-02 DIAGNOSIS — J3489 Other specified disorders of nose and nasal sinuses: Secondary | ICD-10-CM | POA: Diagnosis not present

## 2018-05-02 DIAGNOSIS — L989 Disorder of the skin and subcutaneous tissue, unspecified: Secondary | ICD-10-CM | POA: Diagnosis not present

## 2018-05-03 ENCOUNTER — Ambulatory Visit (INDEPENDENT_AMBULATORY_CARE_PROVIDER_SITE_OTHER): Payer: Medicare Other

## 2018-05-03 ENCOUNTER — Other Ambulatory Visit: Payer: Self-pay

## 2018-05-03 ENCOUNTER — Other Ambulatory Visit: Payer: Self-pay | Admitting: Podiatry

## 2018-05-03 ENCOUNTER — Ambulatory Visit (INDEPENDENT_AMBULATORY_CARE_PROVIDER_SITE_OTHER): Payer: Medicare Other | Admitting: Podiatry

## 2018-05-03 VITALS — BP 128/67 | HR 70

## 2018-05-03 DIAGNOSIS — M79671 Pain in right foot: Secondary | ICD-10-CM

## 2018-05-03 DIAGNOSIS — D2121 Benign neoplasm of connective and other soft tissue of right lower limb, including hip: Secondary | ICD-10-CM

## 2018-05-03 DIAGNOSIS — D2122 Benign neoplasm of connective and other soft tissue of left lower limb, including hip: Secondary | ICD-10-CM

## 2018-05-03 DIAGNOSIS — M722 Plantar fascial fibromatosis: Secondary | ICD-10-CM | POA: Diagnosis not present

## 2018-05-21 NOTE — Progress Notes (Signed)
  Subjective:  Patient ID: Eric Rice, male    DOB: [birth year],  MRN: [id number]  Chief Complaint  Patient presents with  . Foot Pain    Pt states plantar foot lump that is red and painful. Pt states had orthotics for 1 yr that he believes cauesed the issue. Pt states dr told him it was a fibroma.    73 y.o. male presents with the above complaint.  History as above  Review of Systems: Negative except as noted in the HPI. Denies N/V/F/Ch.  Past Medical History:  Diagnosis Date  . Arthritis   . Chest pain    neg adenosine myoview in 2008  . Fibromyalgia   . Hyperlipidemia   . Skin cancer     Current Outpatient Medications:  .  aspirin 81 MG tablet, Take 81 mg by mouth at bedtime. , Disp: , Rfl:  .  calcium carbonate (OS-CAL) 600 MG TABS, Take 600 mg by mouth every morning. , Disp: , Rfl:  .  cholecalciferol (VITAMIN D) 1000 UNITS tablet, Take 1,000 Units by mouth every morning. , Disp: , Rfl:  .  DRYSOL 20 % external solution, APPLY TO AFFECTED AREA AT BEDTIME AS NEEDED, Disp: , Rfl:  .  Multiple Vitamins-Minerals (CENTRUM SILVER PO), Take by mouth.  , Disp: , Rfl:  .  PARoxetine (PAXIL) 20 MG tablet, Take 20 mg by mouth every morning.  , Disp: , Rfl:  .  simvastatin (ZOCOR) 40 MG tablet, Take 40 mg by mouth at bedtime.  , Disp: , Rfl:  .  Testosterone 20.25 MG/1.25GM (1.62%) GEL, 3 pumps to skin every day Please dispense quantity needed for one month, Disp: 1.25 g, Rfl: 3  Social History   Tobacco Use  Smoking Status Never Smoker  Smokeless Tobacco Never Used    No Known Allergies Objective:   Vitals:   05/03/18 0904  BP: 128/67  Pulse: 70   There is no height or weight on file to calculate BMI. Constitutional Well developed. Well nourished.  Vascular Dorsalis pedis pulses palpable bilaterally. Posterior tibial pulses palpable bilaterally. Capillary refill normal to all digits.  No cyanosis or clubbing noted. Pedal hair growth normal.  Neurologic Normal speech.  Oriented to person, place, and time. Epicritic sensation to light touch grossly present bilaterally.  Dermatologic Nails well groomed and normal in appearance. No open wounds. No skin lesions.  Orthopedic:  Firm palpable mass about the medial longitudinal arch of the right foot with pain to palpation   Radiographs: Taken and reviewed no acute fracture dislocation no underlying osseous erosions plantar spurring noted Assessment:   1. Fibromatosis, plantar   2. Plantar fasciitis   3. Pain in right foot    Plan:  Patient was evaluated and treated and all questions answered.  Plantar fibromatosis -Injection delivered to the plantar fibroma -Educated on stretching  Procedure: Fibroma Injection Location: R arch Skin Prep: Alcohol. Injectate: 0.5 cc 0.5% marcaine plain, 0.5 cc dexamethasone phosphate. Disposition: Patient tolerated procedure well. Injection site dressed with a band-aid.   Return in about 6 weeks (around 06/14/2018) for fibroma, right foot.

## 2018-05-29 DIAGNOSIS — M1712 Unilateral primary osteoarthritis, left knee: Secondary | ICD-10-CM | POA: Diagnosis not present

## 2018-05-29 DIAGNOSIS — M7541 Impingement syndrome of right shoulder: Secondary | ICD-10-CM | POA: Diagnosis not present

## 2018-06-13 DIAGNOSIS — M1712 Unilateral primary osteoarthritis, left knee: Secondary | ICD-10-CM | POA: Diagnosis not present

## 2018-06-13 DIAGNOSIS — M25562 Pain in left knee: Secondary | ICD-10-CM | POA: Diagnosis not present

## 2018-06-14 ENCOUNTER — Ambulatory Visit: Payer: Medicare Other | Admitting: Podiatry

## 2018-07-24 DIAGNOSIS — R61 Generalized hyperhidrosis: Secondary | ICD-10-CM | POA: Diagnosis not present

## 2018-07-24 DIAGNOSIS — D485 Neoplasm of uncertain behavior of skin: Secondary | ICD-10-CM | POA: Diagnosis not present

## 2018-07-24 DIAGNOSIS — L814 Other melanin hyperpigmentation: Secondary | ICD-10-CM | POA: Diagnosis not present

## 2018-07-24 DIAGNOSIS — Z8589 Personal history of malignant neoplasm of other organs and systems: Secondary | ICD-10-CM | POA: Diagnosis not present

## 2018-07-24 DIAGNOSIS — L821 Other seborrheic keratosis: Secondary | ICD-10-CM | POA: Diagnosis not present

## 2018-07-24 DIAGNOSIS — D1801 Hemangioma of skin and subcutaneous tissue: Secondary | ICD-10-CM | POA: Diagnosis not present

## 2018-07-24 DIAGNOSIS — L918 Other hypertrophic disorders of the skin: Secondary | ICD-10-CM | POA: Diagnosis not present

## 2018-07-24 DIAGNOSIS — D0421 Carcinoma in situ of skin of right ear and external auricular canal: Secondary | ICD-10-CM | POA: Diagnosis not present

## 2018-07-24 DIAGNOSIS — L309 Dermatitis, unspecified: Secondary | ICD-10-CM | POA: Diagnosis not present

## 2018-08-08 DIAGNOSIS — C44222 Squamous cell carcinoma of skin of right ear and external auricular canal: Secondary | ICD-10-CM | POA: Diagnosis not present

## 2018-10-18 DIAGNOSIS — Z23 Encounter for immunization: Secondary | ICD-10-CM | POA: Diagnosis not present

## 2018-11-19 DIAGNOSIS — N529 Male erectile dysfunction, unspecified: Secondary | ICD-10-CM | POA: Diagnosis not present

## 2018-11-19 DIAGNOSIS — E291 Testicular hypofunction: Secondary | ICD-10-CM | POA: Diagnosis not present

## 2018-12-06 DIAGNOSIS — B078 Other viral warts: Secondary | ICD-10-CM | POA: Diagnosis not present

## 2018-12-06 DIAGNOSIS — L820 Inflamed seborrheic keratosis: Secondary | ICD-10-CM | POA: Diagnosis not present

## 2018-12-06 DIAGNOSIS — L219 Seborrheic dermatitis, unspecified: Secondary | ICD-10-CM | POA: Diagnosis not present

## 2018-12-06 DIAGNOSIS — D485 Neoplasm of uncertain behavior of skin: Secondary | ICD-10-CM | POA: Diagnosis not present

## 2018-12-06 DIAGNOSIS — Z23 Encounter for immunization: Secondary | ICD-10-CM | POA: Diagnosis not present

## 2018-12-06 DIAGNOSIS — L720 Epidermal cyst: Secondary | ICD-10-CM | POA: Diagnosis not present

## 2018-12-27 DIAGNOSIS — R42 Dizziness and giddiness: Secondary | ICD-10-CM | POA: Diagnosis not present

## 2018-12-27 DIAGNOSIS — M542 Cervicalgia: Secondary | ICD-10-CM | POA: Diagnosis not present

## 2019-01-10 DIAGNOSIS — N529 Male erectile dysfunction, unspecified: Secondary | ICD-10-CM | POA: Diagnosis not present

## 2019-03-13 ENCOUNTER — Ambulatory Visit: Payer: Medicare Other | Attending: Internal Medicine

## 2019-03-13 DIAGNOSIS — Z23 Encounter for immunization: Secondary | ICD-10-CM | POA: Insufficient documentation

## 2019-03-13 NOTE — Progress Notes (Signed)
   Covid-19 Vaccination Clinic  Name:  Eric Rice    MRN: [id number] DOB: [birth year]  03/13/2019  Eric Rice was observed post Covid-19 immunization for 15 minutes without incidence. He was provided with Vaccine Information Sheet and instruction to access the V-Safe system.   Eric Rice was instructed to call 911 with any severe reactions post vaccine: Marland Kitchen Difficulty breathing  . Swelling of your face and throat  . A fast heartbeat  . A bad rash all over your body  . Dizziness and weakness    Immunizations Administered    Name Date Dose VIS Date Route   Pfizer COVID-19 Vaccine 03/13/2019  4:16 PM 0.3 mL 01/31/2019 Intramuscular   Manufacturer: [place]   Lot: [id number]   [place]: [id number]

## 2019-03-20 DIAGNOSIS — M25511 Pain in right shoulder: Secondary | ICD-10-CM | POA: Diagnosis not present

## 2019-03-20 DIAGNOSIS — M7541 Impingement syndrome of right shoulder: Secondary | ICD-10-CM | POA: Diagnosis not present

## 2019-03-28 NOTE — Progress Notes (Unsigned)
Virtual Visit via Video   Due to the COVID-19 pandemic, this visit was completed with telemedicine (audio/video) technology to reduce patient and provider exposure as well as to preserve personal protective equipment.   I connected with Eric Rice by a video enabled telemedicine application and verified that I am speaking with the correct person using two identifiers. Location patient: Home Location provider: [facility], Office Persons participating in the virtual visit: [name], Arnette Norris, MD   I discussed the limitations of evaluation and management by telemedicine and the availability of in person appointments. The patient expressed understanding and agreed to proceed.  Care Team   Patient Care Team: Lucille Passy, MD as PCP - General (Family Medicine)  Subjective:   HPI: Patient is connecting today to discuss a recent diagnosis of "at the root of the mesentery there is hazy mesentery fat and there are numerous enlarged, dense lymph nodes. The largest of the these lymph nodes measures 1.6cm in short access period. These are in creased significantly in size compared with prior exam where these lymph nodes were not pathologically enlarged. Findings are suspicious for neoplastic etiologies such as lymphoma." This scan was ordered and given to him by the [place]. They are going to refer him to have a PET scan and a Bx. He is wanting to see if they should be doing anything else and get your opinion.  Review of Systems  Constitutional: Negative for fever and malaise/fatigue.  HENT: Negative for congestion and hearing loss.   Eyes: Negative for blurred vision, discharge and redness.  Respiratory: Negative for cough and shortness of breath.   Cardiovascular: Negative for chest pain, palpitations and leg swelling.  Gastrointestinal: Negative for abdominal pain and heartburn.  Genitourinary: Negative for dysuria.  Musculoskeletal: Negative for falls.  Skin: Negative for rash.    Neurological: Negative for loss of consciousness and headaches.  Endo/Heme/Allergies: Does not bruise/bleed easily.  Psychiatric/Behavioral: Negative for depression.     Patient Active Problem List   Diagnosis Date Noted  . Bilateral hip pain 07/31/2017  . Fatigue 03/26/2017  . Medicare annual wellness visit, subsequent 01/09/2017  . DOE (dyspnea on exertion) 07/20/2015  . History of pulmonary embolus (PE) 03/29/2015  . Hyperlipidemia 01/08/2014  . Coronary atherosclerosis of native coronary artery 01/05/2014  . Abnormal ultrasound of carotid artery 08/15/2013  . Polymyalgia ([place]) 08/15/2013  . 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 Excess weight 08/04/2013  . Diuresis excessive 08/04/2013  . Blood pressure elevated without history of HTN 01/31/2013  . Nocturia 11/19/2012  . Fibromyalgia 11/19/2012  . 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 Acute pulmonary embolism ([place]) 11/30/2011  . Long term use of drug 11/28/2010  . DIVERTICULAR DISEASE 03/09/2010  . Clinical depression 07/30/2008  . Elevated prostate specific antigen (PSA) 07/30/2008  . 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 Smoking status: Never Smoker  . Smokeless tobacco: Never Used  Substance Use Topics  . Alcohol use: No    Current Outpatient Medications:  .  aspirin 81 MG tablet, Take 81 mg by mouth at bedtime. , Disp: , Rfl:  .  calcium carbonate (OS-CAL) 600 MG TABS, Take 600 mg by mouth every morning. , Disp: , Rfl:  .  cholecalciferol (VITAMIN D) 1000 UNITS tablet, Take 1,000 Units by mouth every morning. , Disp: , Rfl:  .  DRYSOL 20 % external solution, APPLY TO AFFECTED AREA AT BEDTIME AS NEEDED, Disp: , Rfl:  .  Multiple Vitamins-Minerals (CENTRUM SILVER PO), Take by mouth.  , Disp: , Rfl:  .  PARoxetine (PAXIL) 20 MG tablet, Take 20 mg by mouth every morning.  , Disp: , Rfl:  .  simvastatin (ZOCOR) 40 MG tablet, Take 40 mg by mouth at bedtime.  , Disp: , Rfl:  .  Testosterone 20.25  MG/1.25GM (1.62%) GEL, 3 pumps to skin every day Please dispense quantity needed for one month, Disp: 1.25 g, Rfl: 3  No Known Allergies  Objective:  There were no vitals taken for this visit.  VITALS: Per patient if applicable, see vitals. GENERAL: Alert, appears well and in no acute distress. HEENT: Atraumatic, conjunctiva clear, no obvious abnormalities on inspection of external nose and ears. NECK: Normal movements of the head and neck. CARDIOPULMONARY: No increased WOB. Speaking in clear sentences. I:E ratio WNL.  MS: Moves all visible extremities without noticeable abnormality. PSYCH: Pleasant and cooperative, well-groomed. Speech normal rate and rhythm. Affect is appropriate. Insight and judgement are appropriate. Attention is focused, linear, and appropriate.  NEURO: CN grossly intact. Oriented as arrived to appointment on time with no prompting. Moves both UE equally.  SKIN: No obvious lesions, wounds, erythema, or cyanosis noted on face or hands.  Depression screen [facility] 2/9 01/09/2017 01/05/2016  Decreased Interest 0 0  Down, Depressed, Hopeless 0 0  PHQ - 2 Score 0 0     . COVID-19 Education: The signs and symptoms of COVID-19 were discussed with the patient and how to seek care for testing if needed. The importance of social distancing was discussed today. . Reviewed expectations re: course of current medical issues. . Discussed self-management of symptoms. . Outlined signs and symptoms indicating need for more acute intervention. . Patient verbalized understanding and all questions were answered. Marland Kitchen Health Maintenance issues including appropriate healthy diet, exercise, and smoking avoidance were discussed with patient. . See orders for this visit as documented in the electronic medical record.  Arnette Norris, MD  Records requested if needed. Time spent: *** minutes, of which >50% was spent in obtaining information about his symptoms, reviewing his previous labs,  evaluations, and treatments, counseling him about his condition (please see the discussed topics above), and developing a plan to further investigate it; he had a number of questions which I addressed.   Lab Results  Component Value Date   WBC 6.9 03/26/2017   HGB 15.3 03/26/2017   HCT 45.0 03/26/2017   PLT 165.0 03/26/2017   GLUCOSE 111 (H) 07/31/2017   CHOL 133 01/09/2017   TRIG 160.0 (H) 01/09/2017   HDL 32.40 (L) 01/09/2017   LDLDIRECT 79.6 10/18/2009   LDLCALC 68 01/09/2017   ALT 25 07/31/2017   AST 22 07/31/2017   NA 139 07/31/2017   K 4.4 07/31/2017   CL 106 07/31/2017   CREATININE 1.08 07/31/2017   BUN 25 (H) 07/31/2017   CO2 27 07/31/2017   TSH 3.74 03/26/2017   PSA 0.74 01/09/2017   INR 1.00 11/28/2011   HGBA1C 6.0 01/05/2016    Lab Results  Component Value Date   TSH 3.74 03/26/2017   Lab Results  Component Value Date   WBC 6.9 03/26/2017   HGB 15.3 03/26/2017   HCT 45.0 03/26/2017   MCV 87.0 03/26/2017   PLT 165.0 03/26/2017   Lab Results  Component Value Date   NA 139 07/31/2017   K 4.4 07/31/2017   CO2 27 07/31/2017   GLUCOSE 111 (H) 07/31/2017   BUN 25 (H) 07/31/2017  CREATININE 1.08 07/31/2017   BILITOT 0.7 07/31/2017   ALKPHOS 43 07/31/2017   AST 22 07/31/2017   ALT 25 07/31/2017   PROT 6.7 07/31/2017   ALBUMIN 4.2 07/31/2017   CALCIUM 9.6 07/31/2017   GFR 71.30 07/31/2017   Lab Results  Component Value Date   CHOL 133 01/09/2017   Lab Results  Component Value Date   HDL 32.40 (L) 01/09/2017   Lab Results  Component Value Date   LDLCALC 68 01/09/2017   Lab Results  Component Value Date   TRIG 160.0 (H) 01/09/2017   Lab Results  Component Value Date   CHOLHDL 4 01/09/2017   Lab Results  Component Value Date   HGBA1C 6.0 01/05/2016       Assessment & Plan:   Problem List Items Addressed This Visit    None      I am having Jeneen Rinks E. Sanderford maintain his simvastatin, PARoxetine, Multiple Vitamins-Minerals (CENTRUM  SILVER PO), calcium carbonate, cholecalciferol, aspirin, Testosterone, and Drysol.  No orders of the defined types were placed in this encounter.    Arnette Norris, MD

## 2019-03-31 ENCOUNTER — Telehealth: Payer: Medicare Other | Admitting: Family Medicine

## 2019-03-31 ENCOUNTER — Encounter: Payer: Self-pay | Admitting: Family Medicine

## 2019-04-02 NOTE — Progress Notes (Signed)
Virtual Visit via Video   Due to the COVID-19 pandemic, this visit was completed with telemedicine (audio/video) technology to reduce patient and provider exposure as well as to preserve personal protective equipment.   I connected with Eric Rice by a video enabled telemedicine application and verified that I am speaking with the correct person using two identifiers. Location patient: Home Location provider: [facility], Office Persons participating in the virtual visit: [name], Arnette Norris, MD   I discussed the limitations of evaluation and management by telemedicine and the availability of in person appointments. The patient expressed understanding and agreed to proceed.  Care Team   Patient Care Team: Lucille Passy, MD as PCP - General (Family Medicine)  Subjective:   HPI: Patient is connecting today to discuss a recent diagnosis of "at the root of the mesentery there is hazy mesentery fat and there are numerous enlarged, dense lymph nodes. The largest of these lymph nodes measures 1.6cm in short access period. These are increased significantly in size compared with prior exam where these lymph nodes were not pathologically enlarged. Findings are suspicious for neoplastic etiologies such as lymphoma." This scan was ordered and given to him by the [facility]. They are going to refer him to have a PET scan and a Bx. He is wanting to see if they should be doing anything else and get your opinion.            Patient is connecting today to discuss a recent diagnosis of "at the root of the mesentery there is hazy mesentery fat and there are numerous enlarged, dense lymph nodes. The largest of these lymph nodes measures 1.6cm in short access period. These are increased significantly in size compared with prior exam where these lymph nodes were not pathologically enlarged. Findings are suspicious for neoplastic etiologies such as lymphoma." This scan was ordered and given to him by the  [facility] They are going to refer him to have a PET scan and a Bx. He is wanting to see if they should be doing anything else and get your opinion. Review of Systems  Constitutional: Negative for fever and malaise/fatigue.  HENT: Negative for congestion and hearing loss.   Eyes: Negative for blurred vision, discharge and redness.  Respiratory: Negative for cough and shortness of breath.   Cardiovascular: Negative for chest pain, palpitations and leg swelling.  Gastrointestinal: Negative for abdominal pain and heartburn.  Genitourinary: Negative for dysuria.  Musculoskeletal: Negative for falls.  Skin: Negative for rash.  Neurological: Negative for loss of consciousness and headaches.  Endo/Heme/Allergies: Does not bruise/bleed easily.  Psychiatric/Behavioral: Negative for depression.     Patient Active Problem List   Diagnosis Date Noted  . Bilateral hip pain 07/31/2017  . Fatigue 03/26/2017  . Medicare annual wellness visit, subsequent 01/09/2017  . DOE (dyspnea on exertion) 07/20/2015  . History of pulmonary embolus (PE) 03/29/2015  . Hyperlipidemia 01/08/2014  . Coronary atherosclerosis of native coronary artery 01/05/2014  . Abnormal ultrasound of carotid artery 08/15/2013  . Polymyalgia ([place]) 08/15/2013  . 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 Excess weight 08/04/2013  . Diuresis excessive 08/04/2013  . Blood pressure elevated without history of HTN 01/31/2013  . Nocturia 11/19/2012  . Fibromyalgia 11/19/2012  . 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 Acute pulmonary embolism ([place]) 11/30/2011  . Long term use of drug 11/28/2010  . DIVERTICULAR DISEASE 03/09/2010  . Clinical depression 07/30/2008  . Elevated prostate specific antigen (PSA) 07/30/2008  . Neurosis, posttraumatic  07/30/2008    Social History   Tobacco Use  . Smoking status: Never Smoker  . Smokeless tobacco: Never Used  Substance Use Topics  . Alcohol use: No    Current Outpatient Medications:  .  aspirin 81 MG  tablet, Take 81 mg by mouth at bedtime. , Disp: , Rfl:  .  calcium carbonate (OS-CAL) 600 MG TABS, Take 600 mg by mouth every morning. , Disp: , Rfl:  .  cholecalciferol (VITAMIN D) 1000 UNITS tablet, Take 1,000 Units by mouth every morning. , Disp: , Rfl:  .  DRYSOL 20 % external solution, APPLY TO AFFECTED AREA AT BEDTIME AS NEEDED, Disp: , Rfl:  .  Multiple Vitamins-Minerals (CENTRUM SILVER PO), Take by mouth.  , Disp: , Rfl:  .  PARoxetine (PAXIL) 20 MG tablet, Take 20 mg by mouth every morning.  , Disp: , Rfl:  .  simvastatin (ZOCOR) 40 MG tablet, Take 40 mg by mouth at bedtime.  , Disp: , Rfl:  .  Testosterone 20.25 MG/1.25GM (1.62%) GEL, 3 pumps to skin every day Please dispense quantity needed for one month, Disp: 1.25 g, Rfl: 3  No Known Allergies  Objective:   VITALS: Per patient if applicable, see vitals. GENERAL: Alert, appears well and in no acute distress. HEENT: Atraumatic, conjunctiva clear, no obvious abnormalities on inspection of external nose and ears. NECK: Normal movements of the head and neck. CARDIOPULMONARY: No increased WOB. Speaking in clear sentences. I:E ratio WNL.  MS: Moves all visible extremities without noticeable abnormality. PSYCH: Pleasant and cooperative, well-groomed. Speech normal rate and rhythm. Affect is appropriate. Insight and judgement are appropriate. Attention is focused, linear, and appropriate.  NEURO: CN grossly intact. Oriented as arrived to appointment on time with no prompting. Moves both UE equally.  SKIN: No obvious lesions, wounds, erythema, or cyanosis noted on face or hands.  Depression screen [facility] 2/9 01/09/2017 01/05/2016  Decreased Interest 0 0  Down, Depressed, Hopeless 0 0  PHQ - 2 Score 0 0     . COVID-19 Education: The signs and symptoms of COVID-19 were discussed with the patient and how to seek care for testing if needed. The importance of social distancing was discussed today. . Reviewed expectations re: course of  current medical issues. . Discussed self-management of symptoms. . Outlined signs and symptoms indicating need for more acute intervention. . Patient verbalized understanding and all questions were answered. Marland Kitchen Health Maintenance issues including appropriate healthy diet, exercise, and smoking avoidance were discussed with patient. . See orders for this visit as documented in the electronic medical record.  Arnette Norris, MD  Records requested if needed. Time spent minutes, of which >50% was spent in obtaining information about his symptoms, reviewing his previous labs, evaluations, and treatments, counseling him about his condition (please see the discussed topics above), and developing a plan to further investigate it; he had a number of questions which I addressed.   Lab Results  Component Value Date   WBC 6.9 03/26/2017   HGB 15.3 03/26/2017   HCT 45.0 03/26/2017   PLT 165.0 03/26/2017   GLUCOSE 111 (H) 07/31/2017   CHOL 133 01/09/2017   TRIG 160.0 (H) 01/09/2017   HDL 32.40 (L) 01/09/2017   LDLDIRECT 79.6 10/18/2009   LDLCALC 68 01/09/2017   ALT 25 07/31/2017   AST 22 07/31/2017   NA 139 07/31/2017   K 4.4 07/31/2017   CL 106 07/31/2017   CREATININE 1.08 07/31/2017   BUN 25 (H) 07/31/2017  CO2 27 07/31/2017   TSH 3.74 03/26/2017   PSA 0.74 01/09/2017   INR 1.00 11/28/2011   HGBA1C 6.0 01/05/2016    Lab Results  Component Value Date   TSH 3.74 03/26/2017   Lab Results  Component Value Date   WBC 6.9 03/26/2017   HGB 15.3 03/26/2017   HCT 45.0 03/26/2017   MCV 87.0 03/26/2017   PLT 165.0 03/26/2017   Lab Results  Component Value Date   NA 139 07/31/2017   K 4.4 07/31/2017   CO2 27 07/31/2017   GLUCOSE 111 (H) 07/31/2017   BUN 25 (H) 07/31/2017   CREATININE 1.08 07/31/2017   BILITOT 0.7 07/31/2017   ALKPHOS 43 07/31/2017   AST 22 07/31/2017   ALT 25 07/31/2017   PROT 6.7 07/31/2017   ALBUMIN 4.2 07/31/2017   CALCIUM 9.6 07/31/2017   GFR 71.30 07/31/2017    Lab Results  Component Value Date   CHOL 133 01/09/2017   Lab Results  Component Value Date   HDL 32.40 (L) 01/09/2017   Lab Results  Component Value Date   LDLCALC 68 01/09/2017   Lab Results  Component Value Date   TRIG 160.0 (H) 01/09/2017   Lab Results  Component Value Date   CHOLHDL 4 01/09/2017   Lab Results  Component Value Date   HGBA1C 6.0 01/05/2016       Assessment & Plan:   Problem List Items Addressed This Visit    None      I am having Eric Rice "Clair Gulling" maintain his simvastatin, PARoxetine, Multiple Vitamins-Minerals (CENTRUM SILVER PO), calcium carbonate, cholecalciferol, aspirin, Testosterone, and Drysol.  No orders of the defined types were placed in this encounter.    Arnette Norris, MD

## 2019-04-03 ENCOUNTER — Encounter: Payer: Medicare Other | Admitting: Family Medicine

## 2019-04-03 ENCOUNTER — Encounter: Payer: Self-pay | Admitting: Family Medicine

## 2019-04-03 ENCOUNTER — Ambulatory Visit: Payer: Medicare Other | Attending: Internal Medicine

## 2019-04-03 DIAGNOSIS — Z23 Encounter for immunization: Secondary | ICD-10-CM

## 2019-04-03 NOTE — Progress Notes (Signed)
   Covid-19 Vaccination Clinic  Name:  Eric Rice    MRN: [id number] DOB: 29-Apr-1945  04/03/2019  Mr. Eric Rice was observed post Covid-19 immunization for 15 minutes without incidence. He was provided with Vaccine Information Sheet and instruction to access the V-Safe system.   Eric Rice was instructed to call 911 with any severe reactions post vaccine: Marland Kitchen Difficulty breathing  . Swelling of your face and throat  . A fast heartbeat  . A bad rash all over your body  . Dizziness and weakness    Immunizations Administered    Name Date Dose VIS Date Route   Pfizer COVID-19 Vaccine 04/03/2019  5:33 PM 0.3 mL 01/31/2019 Intramuscular   Manufacturer: [employer]   Lot: [id number]   [place]: [id number]

## 2019-04-04 DIAGNOSIS — N5201 Erectile dysfunction due to arterial insufficiency: Secondary | ICD-10-CM | POA: Diagnosis not present

## 2019-04-04 DIAGNOSIS — E291 Testicular hypofunction: Secondary | ICD-10-CM | POA: Diagnosis not present

## 2019-04-04 DIAGNOSIS — R102 Pelvic and perineal pain: Secondary | ICD-10-CM | POA: Diagnosis not present

## 2019-04-04 DIAGNOSIS — N400 Enlarged prostate without lower urinary tract symptoms: Secondary | ICD-10-CM | POA: Diagnosis not present

## 2019-04-04 DIAGNOSIS — N281 Cyst of kidney, acquired: Secondary | ICD-10-CM | POA: Diagnosis not present

## 2019-04-09 ENCOUNTER — Other Ambulatory Visit (HOSPITAL_COMMUNITY): Payer: Self-pay | Admitting: Internal Medicine

## 2019-04-09 DIAGNOSIS — R599 Enlarged lymph nodes, unspecified: Secondary | ICD-10-CM

## 2019-04-15 NOTE — Progress Notes (Signed)
Patient was not seen by Dr. Deborra Medina this date/thx dmf

## 2019-04-17 ENCOUNTER — Ambulatory Visit
Admission: RE | Admit: 2019-04-17 | Discharge: 2019-04-17 | Disposition: A | Discharge status: Home / Self Care | Payer: Self-pay | Source: Ambulatory Visit | Attending: Internal Medicine | Admitting: Internal Medicine

## 2019-04-17 ENCOUNTER — Other Ambulatory Visit (HOSPITAL_COMMUNITY): Payer: Self-pay | Admitting: Internal Medicine

## 2019-04-17 DIAGNOSIS — R52 Pain, unspecified: Secondary | ICD-10-CM

## 2019-04-18 ENCOUNTER — Encounter (HOSPITAL_COMMUNITY): Payer: Self-pay

## 2019-04-18 NOTE — Progress Notes (Signed)
Eric Rice "Eric Rice" Male, 74 y.o., [birth year] MRN:  [id number] Phone:  [phone] (H) PCP:  Lucille Passy, MD Primary Cvg:  Medicare/Medicare Part A And B Next Appt With Radiology ([facility]-CT 1) 04/29/2019 at 11:00 AM  RE: Biopsy Received: Today Message Contents  Arne Cleveland, MD  [name]      Ok   CT core mesenteric LAN  Enlarging from previous  R/o lymphoma   DD[facility]   Previous Messages  ----- Message -----  From: [name]  Sent: 04/17/2019  4:19 PM EST  To: Ir Procedure Requests  Subject: Biopsy                      Procedure Requested: US Biopsy    Reason for Procedure: 74 y/o man w/ mesenteric adenopathy concerning for lymphoma    Provider Requesting:  Sheria Lang ([id number])  Provider Telephone: [phone]    Other Info:  Accession Number [id number] CHL  [employer] outside film

## 2019-04-21 DIAGNOSIS — C8290 Follicular lymphoma, unspecified, unspecified site: Secondary | ICD-10-CM

## 2019-04-21 HISTORY — DX: Follicular lymphoma, unspecified, unspecified site: C82.90

## 2019-04-28 ENCOUNTER — Other Ambulatory Visit: Payer: Self-pay | Admitting: Radiology

## 2019-04-29 ENCOUNTER — Encounter (HOSPITAL_COMMUNITY): Payer: Self-pay

## 2019-04-29 ENCOUNTER — Ambulatory Visit (HOSPITAL_COMMUNITY)
Admission: RE | Admit: 2019-04-29 | Discharge: 2019-04-29 | Disposition: A | Discharge status: Home / Self Care | Payer: Medicare Other | Source: Ambulatory Visit | Attending: Internal Medicine | Admitting: Internal Medicine

## 2019-04-29 ENCOUNTER — Other Ambulatory Visit: Payer: Self-pay

## 2019-04-29 DIAGNOSIS — C8290 Follicular lymphoma, unspecified, unspecified site: Secondary | ICD-10-CM | POA: Diagnosis not present

## 2019-04-29 DIAGNOSIS — Z8249 Family history of ischemic heart disease and other diseases of the circulatory system: Secondary | ICD-10-CM | POA: Insufficient documentation

## 2019-04-29 DIAGNOSIS — Z808 Family history of malignant neoplasm of other organs or systems: Secondary | ICD-10-CM | POA: Insufficient documentation

## 2019-04-29 DIAGNOSIS — C8293 Follicular lymphoma, unspecified, intra-abdominal lymph nodes: Secondary | ICD-10-CM | POA: Diagnosis not present

## 2019-04-29 DIAGNOSIS — E785 Hyperlipidemia, unspecified: Secondary | ICD-10-CM | POA: Insufficient documentation

## 2019-04-29 DIAGNOSIS — Z7982 Long term (current) use of aspirin: Secondary | ICD-10-CM | POA: Diagnosis not present

## 2019-04-29 DIAGNOSIS — M1990 Unspecified osteoarthritis, unspecified site: Secondary | ICD-10-CM | POA: Diagnosis not present

## 2019-04-29 DIAGNOSIS — M797 Fibromyalgia: Secondary | ICD-10-CM | POA: Diagnosis not present

## 2019-04-29 DIAGNOSIS — R599 Enlarged lymph nodes, unspecified: Secondary | ICD-10-CM

## 2019-04-29 DIAGNOSIS — Z79899 Other long term (current) drug therapy: Secondary | ICD-10-CM | POA: Diagnosis not present

## 2019-04-29 DIAGNOSIS — R590 Localized enlarged lymph nodes: Secondary | ICD-10-CM | POA: Diagnosis not present

## 2019-04-29 LAB — BASIC METABOLIC PANEL
Anion gap: 7 (ref 5–15)
BUN: 23 mg/dL (ref 8–23)
CO2: 23 mmol/L (ref 22–32)
Calcium: 9.1 mg/dL (ref 8.9–10.3)
Chloride: 110 mmol/L (ref 98–111)
Creatinine, Ser: 1.11 mg/dL (ref 0.61–1.24)
GFR calc Af Amer: >60 mL/min (ref >60)
GFR calc non Af Amer: >60 mL/min (ref >60)
Glucose, Bld: 118 mg/dL — ABNORMAL HIGH (ref 70–99)
Potassium: 4 mmol/L (ref 3.5–5.1)
Sodium: 140 mmol/L (ref 135–145)

## 2019-04-29 LAB — CBC WITH DIFFERENTIAL/PLATELET
Abs Immature Granulocytes: 0.01 10*3/uL (ref 0.00–0.07)
Basophils Absolute: 0.1 10*3/uL (ref 0.0–0.1)
Basophils Relative: 1 %
Eosinophils Absolute: 0.3 10*3/uL (ref 0.0–0.5)
Eosinophils Relative: 4 %
HCT: 54.1 % — ABNORMAL HIGH (ref 39.0–52.0)
Hemoglobin: 17.6 g/dL — ABNORMAL HIGH (ref 13.0–17.0)
Immature Granulocytes: 0 %
Lymphocytes Relative: 20 %
Lymphs Abs: 1.3 10*3/uL (ref 0.7–4.0)
MCH: 29 pg (ref 26.0–34.0)
MCHC: 32.5 g/dL (ref 30.0–36.0)
MCV: 89.1 fL (ref 80.0–100.0)
Monocytes Absolute: 0.8 10*3/uL (ref 0.1–1.0)
Monocytes Relative: 12 %
Neutro Abs: 4.3 10*3/uL (ref 1.7–7.7)
Neutrophils Relative %: 63 %
Platelets: 168 10*3/uL (ref 150–400)
RBC: 6.07 MIL/uL — ABNORMAL HIGH (ref 4.22–5.81)
RDW: 13.6 % (ref 11.5–15.5)
WBC: 6.8 10*3/uL (ref 4.0–10.5)
nRBC: 0 % (ref 0.0–0.2)

## 2019-04-29 LAB — PROTIME-INR
INR: 0.9 (ref 0.8–1.2)
Prothrombin Time: 12.4 seconds (ref 11.4–15.2)

## 2019-04-29 MED ORDER — LIDOCAINE HCL (PF) 1 % IJ SOLN
INTRAMUSCULAR | Status: AC | PRN
  Administered 2019-04-29: 10 mL

## 2019-04-29 MED ORDER — FENTANYL CITRATE (PF) 100 MCG/2ML IJ SOLN
INTRAMUSCULAR | Status: AC | PRN
  Administered 2019-04-29 (×2): 50 ug via INTRAVENOUS

## 2019-04-29 MED ORDER — SODIUM CHLORIDE 0.9 % IV SOLN: INTRAVENOUS | Status: DC

## 2019-04-29 MED ORDER — MIDAZOLAM HCL 2 MG/2ML IJ SOLN
INTRAMUSCULAR | Status: AC | PRN
  Administered 2019-04-29 (×4): 1 mg via INTRAVENOUS

## 2019-04-29 MED ORDER — FENTANYL CITRATE (PF) 100 MCG/2ML IJ SOLN
INTRAMUSCULAR | Status: AC
  Filled 2019-04-29: qty 2

## 2019-04-29 MED ORDER — MIDAZOLAM HCL 2 MG/2ML IJ SOLN
INTRAMUSCULAR | Status: AC
  Filled 2019-04-29: qty 2

## 2019-04-29 NOTE — Discharge Instructions (Signed)

## 2019-04-29 NOTE — Consult Note (Addendum)
Chief Complaint: Enlarging mesenteric lymphadenopathy  Referring Physician(s): Schmalz,Lauren  Supervising Physician: Sandi Mariscal  Patient Status: [facility] - Out-pt  History of Present Illness: Eric Rice is a 74 y.o. male History of Fibromyalgia found to have enlarging  mesenteric lymphadenapathy. Team is requesting mesenteric biopsy with cores for further evaluation of possible lymphoma  Past Medical History:  Diagnosis Date  . Arthritis   . Chest pain    neg adenosine myoview in 2008  . Fibromyalgia   . Hyperlipidemia   . 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 No pertinent surgical history.  Allergies: Patient has no known allergies.  Medications: Prior to Admission medications   Medication Sig Start Date End Date Taking? Authorizing Provider  aspirin 81 MG tablet Take 81 mg by mouth at bedtime.    Yes [provider]  calcium carbonate (OS-CAL) 600 MG TABS Take 600 mg by mouth every morning.    Yes [provider]  cholecalciferol (VITAMIN D) 1000 UNITS tablet Take 1,000 Units by mouth every morning.    Yes [provider]  DRYSOL 20 % external solution APPLY TO AFFECTED AREA AT BEDTIME AS NEEDED 12/03/17  Yes [provider]  Multiple Vitamins-Minerals (CENTRUM SILVER PO) Take by mouth.     Yes [provider]  PARoxetine (PAXIL) 20 MG tablet Take 20 mg by mouth every morning.     Yes [provider]  simvastatin (ZOCOR) 40 MG tablet Take 40 mg by mouth at bedtime.     Yes [provider]  Testosterone 20.25 MG/1.25GM (1.62%) GEL 3 pumps to skin every day Please dispense quantity needed for one month 08/25/15  Yes Lucille Passy, MD     Family History  Problem Relation Age of Onset  . Heart disease Brother 72       MI  . Heart disease Mother   . Heart disease Father     Social History   Socioeconomic History  . Marital status: Married    Spouse name:  Not on file  . Number of children: Not on file  . Years of education: Not on file  . Highest education level: Not on file  Occupational History  . Not on file  Tobacco Use  . Smoking status: Never Smoker  . Smokeless tobacco: Never Used  Substance and Sexual Activity  . Alcohol use: No  . Drug use: No  . Sexual activity: Not on file  Other Topics Concern  . Not on file  Social History Narrative   Retired from [employer].   Married to [place], no kids- one step son.   Social Determinants of Health   Financial Resource Strain:   . Difficulty of Paying Living Expenses: Not on file  Food Insecurity:   . Worried About Charity fundraiser in the Last Year: Not on file  . Ran Out of Food in the Last Year: Not on file  Transportation Needs:   . Lack of Transportation (Medical): Not on file  . Lack of Transportation (Non-Medical): Not on file  Physical Activity:   . Days of Exercise per Week: Not on file  . Minutes of Exercise per Session: Not on file  Stress:   . Feeling of Stress : Not on file  Social Connections:   . Frequency of Communication with Friends and Family: Not on file  . Frequency of Social Gatherings with Friends and Family: Not on file  .  Attends Religious Services: Not on file  . Active Member of Clubs or Organizations: Not on file  . Attends Archivist Meetings: Not on file  . Marital Status: Not on file    Review of Systems: A 12 point ROS discussed and pertinent positives are indicated in the HPI above.  All other systems are negative.  Review of Systems  Constitutional: Negative for fever.       Generalized aches and pains baseline fibromyalgia.   HENT: Negative for congestion.   Respiratory: Negative for cough and shortness of breath.   Cardiovascular: Negative for chest pain.  Gastrointestinal: Negative for abdominal pain.  Neurological: Negative for headaches.  Psychiatric/Behavioral: Negative for behavioral problems and confusion.     Vital Signs: Ht 5' 8.5" (1.74 m)   Wt 200 lb (90.7 kg)   BMI 29.97 kg/m   Physical Exam Vitals and nursing note reviewed.  Constitutional:      Appearance: He is well-developed.  HENT:     Head: Normocephalic.  Cardiovascular:     Rate and Rhythm: Normal rate and regular rhythm.     Heart sounds: Normal heart sounds.  Pulmonary:     Effort: Pulmonary effort is normal.     Breath sounds: Normal breath sounds.  Musculoskeletal:        General: Normal range of motion.     Cervical back: Normal range of motion.  Skin:    General: Skin is dry.  Neurological:     Mental Status: He is alert and oriented to person, place, and time.     Imaging: No results found.  Labs:  CBC: Recent Labs    04/29/19 0930  WBC 6.8  HGB 17.6*  HCT 54.1*  PLT 168    COAGS: Recent Labs    04/29/19 0930  INR 0.9    BMP: No results for input(s): NA, K, CL, CO2, GLUCOSE, BUN, CALCIUM, CREATININE, GFRNONAA, GFRAA in the last 8760 hours.  Invalid input(s): CMP  LIVER FUNCTION TESTS: No results for input(s): BILITOT, AST, ALT, ALKPHOS, PROT, ALBUMIN in the last 8760 hours.  TUMOR MARKERS: No results for input(s): AFPTM, CEA, CA199, CHROMGRNA in the last 8760 hours.  Assessment and Plan:  74 y.o, male ([place]) outpatient. History of Fibromyalgia found to have enlarging  mesenteric lymphadenapathy. Team is requesting mesenteric biopsy with cores for further evaluation of possible lymphoma.  Pertinent Imaging 2.25.21 - OSH CT abd pelvis per note from Dr. Bjorn Loser "at the root of the mesentery there is hazy mesentery fat and there are numerous enlarged, dense lymph nodes. The largest of these lymph nodes measures 1.6cm in short access period. These are increased significantly in size compared with prior exam where these lymph nodes were not pathologically enlarged. Findings are suspicious for neoplastic etiologies such as lymphoma."   Pertinent IR History none  Pertinent  Allergies NKDA   All labs and medications are within acceptable parameters.  Patient is afebrile.  Risks and benefits of mesenteric biopsy was discussed with the patient and/or patient's family including, but not limited to bleeding, infection, damage to adjacent structures or low yield requiring additional tests.  All of the questions were answered and there is agreement to proceed.  Consent signed and in chart.    Thank you for this interesting consult.  I greatly enjoyed meeting Eric Rice and look forward to participating in their care.  A copy of this report was sent to the requesting provider on this date.  Electronically Signed:  Avel Peace, NP 04/29/2019, 10:05 AM   I spent a total of  40 Minutes   in face to face in clinical consultation, greater than 50% of which was counseling/coordinating care for mesenteric biopsy with cores

## 2019-04-29 NOTE — Procedures (Signed)
Pre procedural Dx: Mesenteric lymph adenopathy  Post procedural Dx: Same  Technically successful CT guided biopsy of mesenteric lymph node   EBL: None.  Complications: Inadvertent puncture of an overlying loop of small bowel with associated suspected mural hematoma.  Ronny Bacon, MD Pager #: [phone]

## 2019-05-01 LAB — SURGICAL PATHOLOGY

## 2019-05-13 DIAGNOSIS — M722 Plantar fascial fibromatosis: Secondary | ICD-10-CM | POA: Diagnosis not present

## 2019-05-13 DIAGNOSIS — S76211A Strain of adductor muscle, fascia and tendon of right thigh, initial encounter: Secondary | ICD-10-CM | POA: Diagnosis not present

## 2019-06-10 DIAGNOSIS — D294 Benign neoplasm of scrotum: Secondary | ICD-10-CM | POA: Diagnosis not present

## 2019-06-10 DIAGNOSIS — L814 Other melanin hyperpigmentation: Secondary | ICD-10-CM | POA: Diagnosis not present

## 2019-06-10 DIAGNOSIS — X32XXXS Exposure to sunlight, sequela: Secondary | ICD-10-CM | POA: Diagnosis not present

## 2019-06-10 DIAGNOSIS — L821 Other seborrheic keratosis: Secondary | ICD-10-CM | POA: Diagnosis not present

## 2019-06-10 DIAGNOSIS — L918 Other hypertrophic disorders of the skin: Secondary | ICD-10-CM | POA: Diagnosis not present

## 2019-06-10 DIAGNOSIS — L738 Other specified follicular disorders: Secondary | ICD-10-CM | POA: Diagnosis not present

## 2019-07-07 DIAGNOSIS — M7541 Impingement syndrome of right shoulder: Secondary | ICD-10-CM | POA: Diagnosis not present

## 2019-07-09 DIAGNOSIS — R1031 Right lower quadrant pain: Secondary | ICD-10-CM | POA: Diagnosis not present

## 2019-08-01 DIAGNOSIS — M25551 Pain in right hip: Secondary | ICD-10-CM | POA: Diagnosis not present

## 2019-08-01 DIAGNOSIS — M5416 Radiculopathy, lumbar region: Secondary | ICD-10-CM | POA: Diagnosis not present

## 2019-08-01 DIAGNOSIS — R1031 Right lower quadrant pain: Secondary | ICD-10-CM | POA: Diagnosis not present

## 2019-08-01 DIAGNOSIS — R52 Pain, unspecified: Secondary | ICD-10-CM | POA: Diagnosis not present

## 2019-09-08 DIAGNOSIS — R634 Abnormal weight loss: Secondary | ICD-10-CM | POA: Diagnosis not present

## 2019-09-08 DIAGNOSIS — R1031 Right lower quadrant pain: Secondary | ICD-10-CM | POA: Diagnosis not present

## 2019-09-08 DIAGNOSIS — R630 Anorexia: Secondary | ICD-10-CM | POA: Diagnosis not present

## 2019-09-10 DIAGNOSIS — R630 Anorexia: Secondary | ICD-10-CM | POA: Diagnosis not present

## 2019-09-10 DIAGNOSIS — R634 Abnormal weight loss: Secondary | ICD-10-CM | POA: Diagnosis not present

## 2019-10-09 DIAGNOSIS — M75101 Unspecified rotator cuff tear or rupture of right shoulder, not specified as traumatic: Secondary | ICD-10-CM | POA: Diagnosis not present

## 2019-10-09 DIAGNOSIS — M7541 Impingement syndrome of right shoulder: Secondary | ICD-10-CM | POA: Diagnosis not present

## 2019-10-23 DIAGNOSIS — R1031 Right lower quadrant pain: Secondary | ICD-10-CM | POA: Diagnosis not present

## 2019-12-15 DIAGNOSIS — D1801 Hemangioma of skin and subcutaneous tissue: Secondary | ICD-10-CM | POA: Diagnosis not present

## 2019-12-15 DIAGNOSIS — L821 Other seborrheic keratosis: Secondary | ICD-10-CM | POA: Diagnosis not present

## 2019-12-15 DIAGNOSIS — Z8589 Personal history of malignant neoplasm of other organs and systems: Secondary | ICD-10-CM | POA: Diagnosis not present

## 2019-12-15 DIAGNOSIS — L570 Actinic keratosis: Secondary | ICD-10-CM | POA: Diagnosis not present

## 2019-12-15 DIAGNOSIS — L814 Other melanin hyperpigmentation: Secondary | ICD-10-CM | POA: Diagnosis not present

## 2019-12-15 DIAGNOSIS — L820 Inflamed seborrheic keratosis: Secondary | ICD-10-CM | POA: Diagnosis not present

## 2019-12-23 DIAGNOSIS — Z23 Encounter for immunization: Secondary | ICD-10-CM | POA: Diagnosis not present

## 2020-01-20 DIAGNOSIS — M7541 Impingement syndrome of right shoulder: Secondary | ICD-10-CM | POA: Diagnosis not present

## 2020-01-20 DIAGNOSIS — M75101 Unspecified rotator cuff tear or rupture of right shoulder, not specified as traumatic: Secondary | ICD-10-CM | POA: Diagnosis not present

## 2020-01-27 DIAGNOSIS — R5381 Other malaise: Secondary | ICD-10-CM | POA: Diagnosis not present

## 2020-02-09 DIAGNOSIS — M25511 Pain in right shoulder: Secondary | ICD-10-CM | POA: Diagnosis not present

## 2020-02-26 DIAGNOSIS — M25511 Pain in right shoulder: Secondary | ICD-10-CM | POA: Diagnosis not present

## 2020-03-11 DIAGNOSIS — N400 Enlarged prostate without lower urinary tract symptoms: Secondary | ICD-10-CM | POA: Diagnosis not present

## 2020-03-11 DIAGNOSIS — N3943 Post-void dribbling: Secondary | ICD-10-CM | POA: Diagnosis not present

## 2020-03-11 DIAGNOSIS — R350 Frequency of micturition: Secondary | ICD-10-CM | POA: Diagnosis not present

## 2020-03-11 DIAGNOSIS — R3914 Feeling of incomplete bladder emptying: Secondary | ICD-10-CM | POA: Diagnosis not present

## 2020-03-11 DIAGNOSIS — N5201 Erectile dysfunction due to arterial insufficiency: Secondary | ICD-10-CM | POA: Diagnosis not present

## 2020-03-11 DIAGNOSIS — E291 Testicular hypofunction: Secondary | ICD-10-CM | POA: Diagnosis not present

## 2020-05-24 DIAGNOSIS — M7541 Impingement syndrome of right shoulder: Secondary | ICD-10-CM | POA: Diagnosis not present

## 2020-05-28 DIAGNOSIS — I951 Orthostatic hypotension: Secondary | ICD-10-CM | POA: Diagnosis not present

## 2020-05-28 DIAGNOSIS — M5412 Radiculopathy, cervical region: Secondary | ICD-10-CM | POA: Diagnosis not present

## 2020-05-28 DIAGNOSIS — R42 Dizziness and giddiness: Secondary | ICD-10-CM | POA: Diagnosis not present

## 2020-05-28 DIAGNOSIS — M542 Cervicalgia: Secondary | ICD-10-CM | POA: Diagnosis not present

## 2020-06-04 ENCOUNTER — Other Ambulatory Visit: Payer: Self-pay | Admitting: Family Medicine

## 2020-06-04 DIAGNOSIS — M5412 Radiculopathy, cervical region: Secondary | ICD-10-CM

## 2020-06-04 DIAGNOSIS — M542 Cervicalgia: Secondary | ICD-10-CM

## 2020-06-08 ENCOUNTER — Ambulatory Visit
Admission: RE | Admit: 2020-06-08 | Discharge: 2020-06-08 | Disposition: A | Discharge status: Home / Self Care | Payer: Medicare Other | Source: Ambulatory Visit | Attending: Family Medicine | Admitting: Family Medicine

## 2020-06-08 DIAGNOSIS — M5412 Radiculopathy, cervical region: Secondary | ICD-10-CM

## 2020-06-08 DIAGNOSIS — M542 Cervicalgia: Secondary | ICD-10-CM

## 2020-06-08 DIAGNOSIS — M4802 Spinal stenosis, cervical region: Secondary | ICD-10-CM | POA: Diagnosis not present

## 2020-06-29 DIAGNOSIS — L820 Inflamed seborrheic keratosis: Secondary | ICD-10-CM | POA: Diagnosis not present

## 2020-06-29 DIAGNOSIS — L821 Other seborrheic keratosis: Secondary | ICD-10-CM | POA: Diagnosis not present

## 2020-06-29 DIAGNOSIS — D1801 Hemangioma of skin and subcutaneous tissue: Secondary | ICD-10-CM | POA: Diagnosis not present

## 2020-06-29 DIAGNOSIS — Z8589 Personal history of malignant neoplasm of other organs and systems: Secondary | ICD-10-CM | POA: Diagnosis not present

## 2020-06-29 DIAGNOSIS — L814 Other melanin hyperpigmentation: Secondary | ICD-10-CM | POA: Diagnosis not present

## 2020-06-29 DIAGNOSIS — L570 Actinic keratosis: Secondary | ICD-10-CM | POA: Diagnosis not present

## 2020-07-22 DIAGNOSIS — M47812 Spondylosis without myelopathy or radiculopathy, cervical region: Secondary | ICD-10-CM | POA: Diagnosis not present

## 2020-07-22 DIAGNOSIS — M4802 Spinal stenosis, cervical region: Secondary | ICD-10-CM | POA: Diagnosis not present

## 2020-07-22 DIAGNOSIS — M5030 Other cervical disc degeneration, unspecified cervical region: Secondary | ICD-10-CM | POA: Diagnosis not present

## 2020-07-22 DIAGNOSIS — M792 Neuralgia and neuritis, unspecified: Secondary | ICD-10-CM | POA: Diagnosis not present

## 2020-08-14 DIAGNOSIS — M25511 Pain in right shoulder: Secondary | ICD-10-CM | POA: Diagnosis not present

## 2020-08-20 DIAGNOSIS — M75101 Unspecified rotator cuff tear or rupture of right shoulder, not specified as traumatic: Secondary | ICD-10-CM | POA: Diagnosis not present

## 2020-08-20 DIAGNOSIS — M25811 Other specified joint disorders, right shoulder: Secondary | ICD-10-CM | POA: Diagnosis not present

## 2020-08-20 DIAGNOSIS — M7541 Impingement syndrome of right shoulder: Secondary | ICD-10-CM | POA: Diagnosis not present

## 2020-11-02 ENCOUNTER — Other Ambulatory Visit: Payer: Self-pay

## 2020-11-02 ENCOUNTER — Ambulatory Visit (INDEPENDENT_AMBULATORY_CARE_PROVIDER_SITE_OTHER): Payer: Medicare Other | Admitting: Neurology

## 2020-11-02 ENCOUNTER — Encounter: Payer: Self-pay | Admitting: Neurology

## 2020-11-02 VITALS — BP 114/81 | HR 90 | Ht 68.5 in | Wt 195.0 lb

## 2020-11-02 DIAGNOSIS — M25512 Pain in left shoulder: Secondary | ICD-10-CM

## 2020-11-02 DIAGNOSIS — M63819 Disorders of muscle in diseases classified elsewhere, unspecified shoulder: Secondary | ICD-10-CM | POA: Diagnosis not present

## 2020-11-02 DIAGNOSIS — M25511 Pain in right shoulder: Secondary | ICD-10-CM

## 2020-11-02 DIAGNOSIS — M47812 Spondylosis without myelopathy or radiculopathy, cervical region: Secondary | ICD-10-CM | POA: Diagnosis not present

## 2020-11-02 NOTE — Progress Notes (Signed)
Chief Complaint  Patient presents with   Follow-up    New rm, with wife, reports pain and burning back of neck and back of shoulders, difficulty sleeping due to pain        ASSESSMENT AND PLAN  Eric Rice is a 75 y.o. 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  MRI cervical spine June 08, 2020 1. 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 Moderate to severe right foraminal stenosis at this level. 2. At C6-C7 there is moderate canal and bilateral foraminal stenosis. 3. Mild canal stenosis at C3-C4 and C5-C6 with disc flattening the cord at these levels. 4. Mild left foraminal stenosis at C2-C3, C3-C4, and C5-C6.   MEDICAL HISTORY:  Eric Rice is a 75 year old male, seen in request by her primary care PA Lois Huxley for evaluation of neck pain, bilateral shoulder pain, initial evaluation was on November 02, 2020  I reviewed and summarized the referring note. HLD. Fibromyalgia  Patient suffered depression in the past, tried different medication treatment, often could not tolerate, currently on stable dose of Paxil 20 mg daily, since 2020, he began to have discomfort once asleep, described burning sensation from upper cervical to bilateral shoulder,  symmetric, very uncomfortable when he tried falling to sleep, miserable every night, tosses and turns before he can finally fall into sleep,  He has some right shoulder pain due to right shoulder rotator cuff, intermittent right thumb numbness, he denies gait abnormality, denies bowel and bladder incontinence  Previously has tried trazodone, Cymbalta, other antidepression, even clonazepam, did not help his symptoms, often trigger worsening depression, to the point of suicidal  We personally reviewed MRI of cervical spine on June 09, 2020, multilevel degenerative changes, at C4-5, right eccentric posterior disc osteophyte complex, resulting 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  PHYSICAL EXAMNIATION:  Gen: NAD, conversant, well nourised, well groomed                     Cardiovascular: Regular rate rhythm, no peripheral edema, warm, nontender. Eyes: Conjunctivae clear without exudates or hemorrhage Neck: Supple, no carotid bruits. Pulmonary: Clear to auscultation bilaterally   NEUROLOGICAL EXAM:  MENTAL STATUS: Speech:    Speech is normal; fluent and spontaneous with normal comprehension.  Cognition:     Orientation to time, place and person     Normal recent and remote memory     Normal Attention span and concentration     Normal Language, naming, repeating,spontaneous speech     Fund of knowledge   CRANIAL  NERVES: CN II: Visual fields are full to confrontation. Pupils are round equal and briskly reactive to light. CN III, IV, VI: extraocular movement are normal. No ptosis. CN V: Facial sensation is intact to light touch CN VII: Face is symmetric with normal eye closure  CN VIII: Hearing  is normal to causal conversation. CN IX, X: Phonation is normal. CN XI: Head turning and shoulder shrug are intact  MOTOR: There is no pronator drift of out-stretched arms. Muscle bulk and tone are normal. Muscle strength is normal.  REFLEXES: Reflexes are 2+ and symmetric at the biceps, triceps, knees, and ankles. Plantar responses are flexor.  SENSORY: Intact to light touch, pinprick and vibratory sensation are intact in fingers and toes.  COORDINATION: There is no trunk or limb dysmetria noted.  GAIT/STANCE: Posture is normal. Gait is steady with normal steps, base, arm swing, and turning. Heel and toe walking are normal. Tandem gait is normal.  Romberg is absent.  REVIEW OF SYSTEMS:  Full 14 system review of systems performed and notable only for as above All other review of systems were negative.   ALLERGIES: No Known Allergies  HOME MEDICATIONS: Current Outpatient Medications  Medication Sig Dispense Refill   aspirin 81 MG tablet Take 81 mg by mouth at bedtime.      calcium carbonate (OS-CAL) 600 MG TABS Take 600 mg by mouth every morning.      cholecalciferol (VITAMIN D) 1000 UNITS tablet Take 1,000 Units by mouth every morning.      DRYSOL 20 % external solution APPLY TO AFFECTED AREA AT BEDTIME AS NEEDED     Multiple Vitamins-Minerals (CENTRUM SILVER PO) Take by mouth.       PARoxetine (PAXIL) 20 MG tablet Take 20 mg by mouth every morning.       simvastatin (ZOCOR) 40 MG tablet Take 40 mg by mouth at bedtime.       Testosterone 20.25 MG/1.25GM (1.62%) GEL 3 pumps to skin every day Please dispense quantity needed for one month 1.25 g 3   No current facility-administered medications for this visit.    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 No pertinent surgical history.  FAMILY HISTORY: Family History  Problem Relation Age of Onset   Heart disease Brother 52       MI   Heart disease Mother    Heart disease Father     SOCIAL HISTORY: Social History   Socioeconomic History   Marital status: Married    Spouse name: Not on file   Number of children: Not on file   Years of education: Not on file   Highest education level: Not on file  Occupational History   Not on file  Tobacco Use   Smoking status: Never   Smokeless tobacco: Never  Substance and Sexual Activity   Alcohol use: No   Drug use: No   Sexual activity: Not on file  Other Topics Concern   Not on file  Social History Narrative   Retired from [employer].   Married to [place], no kids- one step son.   Social Determinants of Health   Financial Resource Strain: Not on file  Food Insecurity: Not on file  Transportation Needs: Not on  file  Physical Activity: Not on file  Stress: Not on file  Social Connections: Not on file  Intimate Partner Violence: Not on file      Eric Rice, M.D. Ph.D.  [facility] [address], [place], [place] [address] Ph: [phone] Fax: [phone]  CC:  Lois Huxley, [place],  [place] [id number]  Lois Huxley, [place]

## 2020-11-03 ENCOUNTER — Telehealth: Payer: Self-pay | Admitting: Neurology

## 2020-11-03 LAB — SEDIMENTATION RATE: Sed Rate: 4 mm/hr (ref 0–30)

## 2020-11-03 LAB — CK: Total CK: 68 U/L (ref 41–331)

## 2020-11-03 LAB — ANA W/REFLEX IF POSITIVE: Anti Nuclear Antibody (ANA): NEGATIVE

## 2020-11-03 LAB — C-REACTIVE PROTEIN: CRP: <1 mg/L (ref 0–10)

## 2020-11-03 LAB — TSH: TSH: 4.83 u[IU]/mL — ABNORMAL HIGH (ref 0.450–4.500)

## 2020-11-03 NOTE — Telephone Encounter (Signed)
The only abnormality on extensive laboratory evaluation was increased TSH, this could indicate hypothyroidism  I have forwarded labs to his primary care Lois Huxley, PA   He may contact her for further evaluation

## 2020-11-04 NOTE — Telephone Encounter (Signed)
Attempted to call pt, LVM  

## 2020-11-04 NOTE — Telephone Encounter (Signed)
Pt verified by name and DOB,  normal results given per provider, pt voiced understanding all question answered. He will fu with pcp

## 2020-11-08 NOTE — Telephone Encounter (Signed)
Pt called says he has some questions that he forgot to ask when he was contacted about his lab results. He states its pertaining to the increased level of the TSH. Pt requesting a call back.

## 2020-11-08 NOTE — Telephone Encounter (Signed)
I spoke to the patient. He wanted to know his TSH level. He is going to follow up with his PCP for further evaluation.

## 2020-11-17 DIAGNOSIS — R350 Frequency of micturition: Secondary | ICD-10-CM | POA: Diagnosis not present

## 2020-11-17 DIAGNOSIS — R946 Abnormal results of thyroid function studies: Secondary | ICD-10-CM | POA: Diagnosis not present

## 2020-11-17 DIAGNOSIS — E291 Testicular hypofunction: Secondary | ICD-10-CM | POA: Diagnosis not present

## 2020-11-17 DIAGNOSIS — R7989 Other specified abnormal findings of blood chemistry: Secondary | ICD-10-CM | POA: Diagnosis not present

## 2020-11-19 DIAGNOSIS — M19211 Secondary osteoarthritis, right shoulder: Secondary | ICD-10-CM | POA: Diagnosis not present

## 2020-11-24 DIAGNOSIS — M7541 Impingement syndrome of right shoulder: Secondary | ICD-10-CM | POA: Diagnosis not present

## 2020-11-24 DIAGNOSIS — M25811 Other specified joint disorders, right shoulder: Secondary | ICD-10-CM | POA: Diagnosis not present

## 2020-12-30 DIAGNOSIS — L814 Other melanin hyperpigmentation: Secondary | ICD-10-CM | POA: Diagnosis not present

## 2020-12-30 DIAGNOSIS — L570 Actinic keratosis: Secondary | ICD-10-CM | POA: Diagnosis not present

## 2020-12-30 DIAGNOSIS — L578 Other skin changes due to chronic exposure to nonionizing radiation: Secondary | ICD-10-CM | POA: Diagnosis not present

## 2020-12-30 DIAGNOSIS — L820 Inflamed seborrheic keratosis: Secondary | ICD-10-CM | POA: Diagnosis not present

## 2020-12-30 DIAGNOSIS — Z23 Encounter for immunization: Secondary | ICD-10-CM | POA: Diagnosis not present

## 2020-12-30 DIAGNOSIS — L821 Other seborrheic keratosis: Secondary | ICD-10-CM | POA: Diagnosis not present

## 2020-12-30 DIAGNOSIS — Z8589 Personal history of malignant neoplasm of other organs and systems: Secondary | ICD-10-CM | POA: Diagnosis not present

## 2020-12-30 DIAGNOSIS — D1801 Hemangioma of skin and subcutaneous tissue: Secondary | ICD-10-CM | POA: Diagnosis not present

## 2020-12-30 DIAGNOSIS — D225 Melanocytic nevi of trunk: Secondary | ICD-10-CM | POA: Diagnosis not present

## 2021-01-20 DIAGNOSIS — N5201 Erectile dysfunction due to arterial insufficiency: Secondary | ICD-10-CM | POA: Diagnosis not present

## 2021-01-20 DIAGNOSIS — N401 Enlarged prostate with lower urinary tract symptoms: Secondary | ICD-10-CM | POA: Diagnosis not present

## 2021-01-20 DIAGNOSIS — R3914 Feeling of incomplete bladder emptying: Secondary | ICD-10-CM | POA: Diagnosis not present

## 2021-01-20 DIAGNOSIS — R3912 Poor urinary stream: Secondary | ICD-10-CM | POA: Diagnosis not present

## 2021-02-18 DIAGNOSIS — R0981 Nasal congestion: Secondary | ICD-10-CM | POA: Diagnosis not present

## 2021-02-18 DIAGNOSIS — J3489 Other specified disorders of nose and nasal sinuses: Secondary | ICD-10-CM | POA: Diagnosis not present

## 2021-02-18 DIAGNOSIS — J019 Acute sinusitis, unspecified: Secondary | ICD-10-CM | POA: Diagnosis not present

## 2021-02-28 DIAGNOSIS — J019 Acute sinusitis, unspecified: Secondary | ICD-10-CM | POA: Diagnosis not present

## 2021-03-17 DIAGNOSIS — M542 Cervicalgia: Secondary | ICD-10-CM | POA: Diagnosis not present

## 2021-03-17 DIAGNOSIS — M7541 Impingement syndrome of right shoulder: Secondary | ICD-10-CM | POA: Diagnosis not present

## 2021-03-28 DIAGNOSIS — M5030 Other cervical disc degeneration, unspecified cervical region: Secondary | ICD-10-CM | POA: Diagnosis not present

## 2021-03-28 DIAGNOSIS — M5412 Radiculopathy, cervical region: Secondary | ICD-10-CM | POA: Diagnosis not present

## 2021-03-28 DIAGNOSIS — M4802 Spinal stenosis, cervical region: Secondary | ICD-10-CM | POA: Diagnosis not present

## 2021-04-05 ENCOUNTER — Other Ambulatory Visit (HOSPITAL_BASED_OUTPATIENT_CLINIC_OR_DEPARTMENT_OTHER): Payer: Self-pay

## 2021-04-05 DIAGNOSIS — G4700 Insomnia, unspecified: Secondary | ICD-10-CM

## 2021-04-12 DIAGNOSIS — M542 Cervicalgia: Secondary | ICD-10-CM | POA: Diagnosis not present

## 2021-04-14 DIAGNOSIS — M5412 Radiculopathy, cervical region: Secondary | ICD-10-CM | POA: Diagnosis not present

## 2021-05-09 ENCOUNTER — Encounter (HOSPITAL_BASED_OUTPATIENT_CLINIC_OR_DEPARTMENT_OTHER): Payer: Medicare Other | Admitting: Internal Medicine

## 2021-05-31 DIAGNOSIS — R3914 Feeling of incomplete bladder emptying: Secondary | ICD-10-CM | POA: Diagnosis not present

## 2021-05-31 DIAGNOSIS — N401 Enlarged prostate with lower urinary tract symptoms: Secondary | ICD-10-CM | POA: Diagnosis not present

## 2021-05-31 DIAGNOSIS — N5201 Erectile dysfunction due to arterial insufficiency: Secondary | ICD-10-CM | POA: Diagnosis not present

## 2021-05-31 DIAGNOSIS — N3943 Post-void dribbling: Secondary | ICD-10-CM | POA: Diagnosis not present

## 2021-06-02 DIAGNOSIS — M7541 Impingement syndrome of right shoulder: Secondary | ICD-10-CM | POA: Diagnosis not present

## 2021-06-02 DIAGNOSIS — M25811 Other specified joint disorders, right shoulder: Secondary | ICD-10-CM | POA: Diagnosis not present

## 2021-06-14 DIAGNOSIS — M797 Fibromyalgia: Secondary | ICD-10-CM | POA: Diagnosis not present

## 2021-06-14 DIAGNOSIS — F411 Generalized anxiety disorder: Secondary | ICD-10-CM | POA: Diagnosis not present

## 2021-06-14 DIAGNOSIS — R7303 Prediabetes: Secondary | ICD-10-CM | POA: Diagnosis not present

## 2021-06-14 DIAGNOSIS — N400 Enlarged prostate without lower urinary tract symptoms: Secondary | ICD-10-CM | POA: Diagnosis not present

## 2021-06-14 DIAGNOSIS — E7800 Pure hypercholesterolemia, unspecified: Secondary | ICD-10-CM | POA: Diagnosis not present

## 2021-06-14 DIAGNOSIS — E291 Testicular hypofunction: Secondary | ICD-10-CM | POA: Diagnosis not present

## 2021-06-14 DIAGNOSIS — F4310 Post-traumatic stress disorder, unspecified: Secondary | ICD-10-CM | POA: Diagnosis not present

## 2021-06-14 DIAGNOSIS — Z0000 Encounter for general adult medical examination without abnormal findings: Secondary | ICD-10-CM | POA: Diagnosis not present

## 2021-06-14 DIAGNOSIS — N529 Male erectile dysfunction, unspecified: Secondary | ICD-10-CM | POA: Diagnosis not present

## 2021-06-14 DIAGNOSIS — Z1331 Encounter for screening for depression: Secondary | ICD-10-CM | POA: Diagnosis not present

## 2021-06-14 DIAGNOSIS — Z86711 Personal history of pulmonary embolism: Secondary | ICD-10-CM | POA: Diagnosis not present

## 2021-06-14 DIAGNOSIS — C8590 Non-Hodgkin lymphoma, unspecified, unspecified site: Secondary | ICD-10-CM | POA: Diagnosis not present

## 2021-06-21 DIAGNOSIS — L821 Other seborrheic keratosis: Secondary | ICD-10-CM | POA: Diagnosis not present

## 2021-06-21 DIAGNOSIS — L739 Follicular disorder, unspecified: Secondary | ICD-10-CM | POA: Diagnosis not present

## 2021-06-21 DIAGNOSIS — L738 Other specified follicular disorders: Secondary | ICD-10-CM | POA: Diagnosis not present

## 2021-07-13 DIAGNOSIS — N5201 Erectile dysfunction due to arterial insufficiency: Secondary | ICD-10-CM | POA: Diagnosis not present

## 2021-07-26 DIAGNOSIS — D0472 Carcinoma in situ of skin of left lower limb, including hip: Secondary | ICD-10-CM | POA: Diagnosis not present

## 2021-07-26 DIAGNOSIS — D485 Neoplasm of uncertain behavior of skin: Secondary | ICD-10-CM | POA: Diagnosis not present

## 2021-07-26 DIAGNOSIS — D225 Melanocytic nevi of trunk: Secondary | ICD-10-CM | POA: Diagnosis not present

## 2021-07-26 DIAGNOSIS — L738 Other specified follicular disorders: Secondary | ICD-10-CM | POA: Diagnosis not present

## 2021-07-26 DIAGNOSIS — L814 Other melanin hyperpigmentation: Secondary | ICD-10-CM | POA: Diagnosis not present

## 2021-07-26 DIAGNOSIS — L820 Inflamed seborrheic keratosis: Secondary | ICD-10-CM | POA: Diagnosis not present

## 2021-07-26 DIAGNOSIS — Z8589 Personal history of malignant neoplasm of other organs and systems: Secondary | ICD-10-CM | POA: Diagnosis not present

## 2021-07-26 DIAGNOSIS — L578 Other skin changes due to chronic exposure to nonionizing radiation: Secondary | ICD-10-CM | POA: Diagnosis not present

## 2021-07-26 DIAGNOSIS — D1801 Hemangioma of skin and subcutaneous tissue: Secondary | ICD-10-CM | POA: Diagnosis not present

## 2021-07-26 DIAGNOSIS — L821 Other seborrheic keratosis: Secondary | ICD-10-CM | POA: Diagnosis not present

## 2021-07-26 DIAGNOSIS — L570 Actinic keratosis: Secondary | ICD-10-CM | POA: Diagnosis not present

## 2021-08-02 DIAGNOSIS — N401 Enlarged prostate with lower urinary tract symptoms: Secondary | ICD-10-CM | POA: Diagnosis not present

## 2021-08-02 DIAGNOSIS — R3914 Feeling of incomplete bladder emptying: Secondary | ICD-10-CM | POA: Diagnosis not present

## 2021-08-03 ENCOUNTER — Other Ambulatory Visit: Payer: Self-pay | Admitting: Urology

## 2021-08-16 DIAGNOSIS — Z01818 Encounter for other preprocedural examination: Secondary | ICD-10-CM | POA: Diagnosis not present

## 2021-08-25 DIAGNOSIS — S7001XD Contusion of right hip, subsequent encounter: Secondary | ICD-10-CM | POA: Diagnosis not present

## 2021-09-01 ENCOUNTER — Encounter (HOSPITAL_BASED_OUTPATIENT_CLINIC_OR_DEPARTMENT_OTHER): Payer: Self-pay | Admitting: Urology

## 2021-09-01 NOTE — Progress Notes (Signed)
Spoke w/ via phone for pre-op interview--- Eric Rice Lab needs dos----  NONE             Lab results------ COVID test -----patient states asymptomatic no test needed Arrive at ---0745 NPO after MN NO Solid Food. Med rec completed Medications to take morning of surgery ----- Paxil Diabetic medication ----- Patient instructed no nail polish to be worn day of surgery Patient instructed to bring photo id and insurance card day of surgery Patient aware to have Driver (ride ) / caregiver Kyrel Leighton    for 24 hours after surgery  Patient Special Instructions ----- Pt is overnight in [place]. Guidelines discussed, questions answered and patient verbalized understanding. Pre-Op special Istructions ----- Patient verbalized understanding of instructions that were given at this phone interview. Patient denies shortness of breath, chest pain, fever, cough at this phone interview.

## 2021-09-08 DIAGNOSIS — M25811 Other specified joint disorders, right shoulder: Secondary | ICD-10-CM | POA: Diagnosis not present

## 2021-09-08 DIAGNOSIS — M13841 Other specified arthritis, right hand: Secondary | ICD-10-CM | POA: Diagnosis not present

## 2021-09-08 DIAGNOSIS — M7541 Impingement syndrome of right shoulder: Secondary | ICD-10-CM | POA: Diagnosis not present

## 2021-09-08 DIAGNOSIS — M79641 Pain in right hand: Secondary | ICD-10-CM | POA: Diagnosis not present

## 2021-09-13 ENCOUNTER — Encounter (HOSPITAL_BASED_OUTPATIENT_CLINIC_OR_DEPARTMENT_OTHER)
Admission: RE | Disposition: A | Discharge status: Home / Self Care | Payer: Self-pay | Source: Ambulatory Visit | Attending: Urology

## 2021-09-13 ENCOUNTER — Ambulatory Visit (HOSPITAL_BASED_OUTPATIENT_CLINIC_OR_DEPARTMENT_OTHER): Payer: Medicare Other | Admitting: Certified Registered"

## 2021-09-13 ENCOUNTER — Ambulatory Visit (HOSPITAL_BASED_OUTPATIENT_CLINIC_OR_DEPARTMENT_OTHER)
Admission: RE | Admit: 2021-09-13 | Discharge: 2021-09-14 | Disposition: A | Discharge status: Home / Self Care | Payer: Medicare Other | Source: Ambulatory Visit | Attending: Urology | Admitting: Urology

## 2021-09-13 ENCOUNTER — Encounter (HOSPITAL_BASED_OUTPATIENT_CLINIC_OR_DEPARTMENT_OTHER): Payer: Self-pay | Admitting: Urology

## 2021-09-13 DIAGNOSIS — N3943 Post-void dribbling: Secondary | ICD-10-CM | POA: Diagnosis not present

## 2021-09-13 DIAGNOSIS — N401 Enlarged prostate with lower urinary tract symptoms: Secondary | ICD-10-CM | POA: Insufficient documentation

## 2021-09-13 DIAGNOSIS — N320 Bladder-neck obstruction: Secondary | ICD-10-CM | POA: Insufficient documentation

## 2021-09-13 DIAGNOSIS — N400 Enlarged prostate without lower urinary tract symptoms: Secondary | ICD-10-CM

## 2021-09-13 DIAGNOSIS — N138 Other obstructive and reflux uropathy: Secondary | ICD-10-CM | POA: Diagnosis present

## 2021-09-13 DIAGNOSIS — R338 Other retention of urine: Secondary | ICD-10-CM | POA: Insufficient documentation

## 2021-09-13 DIAGNOSIS — Z79899 Other long term (current) drug therapy: Secondary | ICD-10-CM | POA: Insufficient documentation

## 2021-09-13 HISTORY — PX: TRANSURETHRAL RESECTION OF PROSTATE: SHX73

## 2021-09-13 HISTORY — DX: Anxiety disorder, unspecified: F41.9

## 2021-09-13 SURGERY — TURP (TRANSURETHRAL RESECTION OF PROSTATE)
Anesthesia: General | Site: Prostate

## 2021-09-13 MED ORDER — CALCIUM CARBONATE 600 MG PO TABS: 600.0000 mg | ORAL_TABLET | Freq: Every morning | ORAL | Status: DC

## 2021-09-13 MED ORDER — VITAMIN D3 25 MCG PO TABS
1000.0000 [IU] | ORAL_TABLET | Freq: Every morning | ORAL | Status: DC
  Filled 2021-09-13 (×2): qty 1

## 2021-09-13 MED ORDER — SODIUM CHLORIDE 0.9 % IR SOLN
Status: DC | PRN
  Administered 2021-09-13 (×3): 6000 mL via INTRAVESICAL
  Administered 2021-09-13: 3000 mL via INTRAVESICAL

## 2021-09-13 MED ORDER — FENTANYL CITRATE (PF) 100 MCG/2ML IJ SOLN
INTRAMUSCULAR | Status: AC
  Filled 2021-09-13: qty 2

## 2021-09-13 MED ORDER — CALCIUM CARBONATE 1250 (500 CA) MG PO TABS
1.0000 | ORAL_TABLET | Freq: Every day | ORAL | Status: DC
  Filled 2021-09-13 (×2): qty 1

## 2021-09-13 MED ORDER — PROPOFOL 10 MG/ML IV BOLUS
INTRAVENOUS | Status: AC
  Filled 2021-09-13: qty 20

## 2021-09-13 MED ORDER — CEFAZOLIN SODIUM-DEXTROSE 2-4 GM/100ML-% IV SOLN
INTRAVENOUS | Status: AC
  Filled 2021-09-13: qty 100

## 2021-09-13 MED ORDER — SODIUM CHLORIDE 0.9 % IV SOLN: 250.0000 mL | INTRAVENOUS | Status: DC | PRN

## 2021-09-13 MED ORDER — ONDANSETRON HCL 4 MG/2ML IJ SOLN: 4.0000 mg | Freq: Once | INTRAMUSCULAR | Status: DC | PRN

## 2021-09-13 MED ORDER — OXYCODONE HCL 5 MG/5ML PO SOLN: 5.0000 mg | Freq: Once | ORAL | Status: DC | PRN

## 2021-09-13 MED ORDER — DEXTROSE-NACL 5-0.45 % IV SOLN: INTRAVENOUS | Status: DC

## 2021-09-13 MED ORDER — OXYCODONE HCL 5 MG PO TABS
5.0000 mg | ORAL_TABLET | ORAL | Status: DC | PRN
  Administered 2021-09-13 – 2021-09-14 (×3): 5 mg via ORAL

## 2021-09-13 MED ORDER — LACTATED RINGERS IV SOLN: INTRAVENOUS | Status: DC

## 2021-09-13 MED ORDER — TRIPLE ANTIBIOTIC 3.5-400-5000 EX OINT: 1.0000 | TOPICAL_OINTMENT | Freq: Three times a day (TID) | CUTANEOUS | Status: DC | PRN

## 2021-09-13 MED ORDER — ACETAMINOPHEN 500 MG PO TABS
1000.0000 mg | ORAL_TABLET | Freq: Once | ORAL | Status: AC
  Administered 2021-09-13: 1000 mg via ORAL

## 2021-09-13 MED ORDER — ONDANSETRON HCL 4 MG/2ML IJ SOLN: 4.0000 mg | INTRAMUSCULAR | Status: DC | PRN

## 2021-09-13 MED ORDER — FENTANYL CITRATE (PF) 100 MCG/2ML IJ SOLN
25.0000 ug | INTRAMUSCULAR | Status: DC | PRN
  Administered 2021-09-13: 50 ug via INTRAVENOUS

## 2021-09-13 MED ORDER — TESTOSTERONE 20.25 MG/1.25GM (1.62%) TD GEL
3.0000 | Freq: Every day | TRANSDERMAL | Status: DC
Start: 2021-09-14 — End: 2021-09-14

## 2021-09-13 MED ORDER — ONDANSETRON HCL 4 MG/2ML IJ SOLN
INTRAMUSCULAR | Status: DC | PRN
  Administered 2021-09-13: 4 mg via INTRAVENOUS

## 2021-09-13 MED ORDER — SIMVASTATIN 40 MG PO TABS
40.0000 mg | ORAL_TABLET | Freq: Every day | ORAL | Status: DC
  Filled 2021-09-13: qty 1

## 2021-09-13 MED ORDER — SENNOSIDES-DOCUSATE SODIUM 8.6-50 MG PO TABS
2.0000 | ORAL_TABLET | Freq: Every day | ORAL | Status: DC
  Administered 2021-09-13: 2 via ORAL
  Filled 2021-09-13: qty 2

## 2021-09-13 MED ORDER — CEFAZOLIN SODIUM-DEXTROSE 2-4 GM/100ML-% IV SOLN
2.0000 g | INTRAVENOUS | Status: AC
  Administered 2021-09-13: 2 g via INTRAVENOUS

## 2021-09-13 MED ORDER — PHENYLEPHRINE 80 MCG/ML (10ML) SYRINGE FOR IV PUSH (FOR BLOOD PRESSURE SUPPORT)
PREFILLED_SYRINGE | INTRAVENOUS | Status: DC | PRN
  Administered 2021-09-13: 80 ug via INTRAVENOUS

## 2021-09-13 MED ORDER — ALUMINUM CHLORIDE 20 % EX SOLN
CUTANEOUS | Status: DC
Start: 2021-09-13 — End: 2021-09-13

## 2021-09-13 MED ORDER — DIPHENHYDRAMINE HCL 12.5 MG/5ML PO ELIX: 12.5000 mg | ORAL_SOLUTION | Freq: Four times a day (QID) | ORAL | Status: DC | PRN

## 2021-09-13 MED ORDER — PROPOFOL 10 MG/ML IV BOLUS
INTRAVENOUS | Status: DC | PRN
  Administered 2021-09-13: 170 mg via INTRAVENOUS

## 2021-09-13 MED ORDER — MORPHINE SULFATE (PF) 4 MG/ML IV SOLN: 2.0000 mg | INTRAVENOUS | Status: DC | PRN

## 2021-09-13 MED ORDER — SODIUM CHLORIDE 0.9% FLUSH: 3.0000 mL | INTRAVENOUS | Status: DC | PRN

## 2021-09-13 MED ORDER — LIDOCAINE HCL (PF) 2 % IJ SOLN
INTRAMUSCULAR | Status: AC
  Filled 2021-09-13: qty 5

## 2021-09-13 MED ORDER — LIDOCAINE 2% (20 MG/ML) 5 ML SYRINGE
INTRAMUSCULAR | Status: DC | PRN
  Administered 2021-09-13: 60 mg via INTRAVENOUS

## 2021-09-13 MED ORDER — EPHEDRINE SULFATE-NACL 50-0.9 MG/10ML-% IV SOSY
PREFILLED_SYRINGE | INTRAVENOUS | Status: DC | PRN
  Administered 2021-09-13: 5 mg via INTRAVENOUS

## 2021-09-13 MED ORDER — OXYCODONE HCL 5 MG PO TABS: 5.0000 mg | ORAL_TABLET | Freq: Once | ORAL | Status: DC | PRN

## 2021-09-13 MED ORDER — ACETAMINOPHEN 500 MG PO TABS
ORAL_TABLET | ORAL | Status: AC
  Filled 2021-09-13: qty 2

## 2021-09-13 MED ORDER — OXYCODONE HCL 5 MG PO TABS
ORAL_TABLET | ORAL | Status: AC
  Filled 2021-09-13: qty 1

## 2021-09-13 MED ORDER — DIPHENHYDRAMINE HCL 50 MG/ML IJ SOLN
INTRAMUSCULAR | Status: AC
  Filled 2021-09-13: qty 1

## 2021-09-13 MED ORDER — SODIUM CHLORIDE 0.9% FLUSH: 3.0000 mL | Freq: Two times a day (BID) | INTRAVENOUS | Status: DC

## 2021-09-13 MED ORDER — DEXAMETHASONE SODIUM PHOSPHATE 10 MG/ML IJ SOLN
INTRAMUSCULAR | Status: AC
  Filled 2021-09-13: qty 1

## 2021-09-13 MED ORDER — SODIUM CHLORIDE 0.9 % IR SOLN
3000.0000 mL | Status: DC
Start: 2021-09-13 — End: 2021-09-14
  Administered 2021-09-14: 3000 mL

## 2021-09-13 MED ORDER — FENTANYL CITRATE (PF) 100 MCG/2ML IJ SOLN
INTRAMUSCULAR | Status: DC | PRN
  Administered 2021-09-13: 75 ug via INTRAVENOUS
  Administered 2021-09-13: 25 ug via INTRAVENOUS

## 2021-09-13 MED ORDER — CEFAZOLIN SODIUM-DEXTROSE 2-4 GM/100ML-% IV SOLN
2.0000 g | Freq: Three times a day (TID) | INTRAVENOUS | Status: AC
  Administered 2021-09-13 – 2021-09-14 (×2): 2 g via INTRAVENOUS

## 2021-09-13 MED ORDER — WHITE PETROLATUM EX OINT
TOPICAL_OINTMENT | CUTANEOUS | Status: AC
  Filled 2021-09-13: qty 5

## 2021-09-13 MED ORDER — ACETAMINOPHEN 500 MG PO TABS
1000.0000 mg | ORAL_TABLET | Freq: Four times a day (QID) | ORAL | Status: AC
  Administered 2021-09-13 – 2021-09-14 (×4): 1000 mg via ORAL

## 2021-09-13 MED ORDER — DIPHENHYDRAMINE HCL 50 MG/ML IJ SOLN
12.5000 mg | Freq: Four times a day (QID) | INTRAMUSCULAR | Status: DC | PRN
  Administered 2021-09-13: 12.5 mg via INTRAVENOUS

## 2021-09-13 MED ORDER — PAROXETINE HCL 20 MG PO TABS
20.0000 mg | ORAL_TABLET | ORAL | Status: DC
  Filled 2021-09-13: qty 1

## 2021-09-13 MED ORDER — 0.9 % SODIUM CHLORIDE (POUR BTL) OPTIME
TOPICAL | Status: DC | PRN
  Administered 2021-09-13: 500 mL

## 2021-09-13 MED ORDER — ONDANSETRON HCL 4 MG/2ML IJ SOLN
INTRAMUSCULAR | Status: AC
  Filled 2021-09-13: qty 2

## 2021-09-13 MED ORDER — DEXAMETHASONE SODIUM PHOSPHATE 10 MG/ML IJ SOLN
INTRAMUSCULAR | Status: DC | PRN
  Administered 2021-09-13: 5 mg via INTRAVENOUS

## 2021-09-13 SURGICAL SUPPLY — 19 items
BAG DRAIN URO-CYSTO SKYTR STRL (DRAIN) ×2 IMPLANT
BAG DRN RND TRDRP ANRFLXCHMBR (UROLOGICAL SUPPLIES) ×1
BAG DRN UROCATH (DRAIN) ×1
BAG URINE DRAIN 2000ML AR STRL (UROLOGICAL SUPPLIES) ×2 IMPLANT
CATH FOLEY 3WAY 30CC 22FR (CATHETERS) ×2 IMPLANT
CATH HEMA 3WAY 30CC 22FR COUDE (CATHETERS) ×2 IMPLANT
CLOTH BEACON ORANGE TIMEOUT ST (SAFETY) ×2 IMPLANT
GLOVE BIO SURGEON STRL SZ 6.5 (GLOVE) ×2 IMPLANT
GOWN STRL REUS W/TWL LRG LVL3 (GOWN DISPOSABLE) ×2 IMPLANT
HOLDER FOLEY CATH W/STRAP (MISCELLANEOUS) ×2 IMPLANT
IV NS IRRIG 3000ML ARTHROMATIC (IV SOLUTION) ×8 IMPLANT
KIT TURNOVER CYSTO (KITS) ×2 IMPLANT
LOOP CUT BIPOLAR 24F LRG (ELECTROSURGICAL) IMPLANT
MANIFOLD NEPTUNE II (INSTRUMENTS) ×2 IMPLANT
PACK CYSTO (CUSTOM PROCEDURE TRAY) ×2 IMPLANT
SYR TOOMEY IRRIG 70ML (MISCELLANEOUS) ×2
SYRINGE TOOMEY IRRIG 70ML (MISCELLANEOUS) ×1 IMPLANT
TUBE CONNECTING 12X1/4 (SUCTIONS) ×2 IMPLANT
TUBING UROLOGY SET (TUBING) ×2 IMPLANT

## 2021-09-13 NOTE — H&P (Signed)
CC/HPI: cc: BPH with LUTs   08/02/21: 76 year old man with a history of BPH with lower urinary tract symptoms, ED and low testosterone here for follow-up. Patient had significant adverse side effects to tamsulosin, Uroxatrol and finasteride. He continues to be bothered by urinary symptoms. Specifically he has a weak stream, intermittency and has to strain to void. He is here today for cysto, flowrate/PVR.     ALLERGIES: Doxycycline Hyclate TABS    MEDICATIONS: Androgel  Finasteride 5 mg tablet 1 tablet PO Daily  Tamsulosin Hcl 0.4 mg capsule 1 capsule PO Q HS  Tamsulosin Hcl 0.4 mg capsule 1 capsule PO Q HS  Triple Mix 0.5 mg Intracavernosal As Directed PGE1 '40mg'$ , phentolamine '1mg'$ , papaverine '30mg'$ ; '10mg'$ /mL; dispense 10 mL  Uroxatral 10 mg tablet, extended release 24 hr 1 tablet PO Q HS  Aspirin 81 MG TABS Oral  Calcium 600 MG Oral Tablet Oral  Centrum Silver tablet Oral  Paroxetine Hcl 20 mg tablet Oral  Simvastatin 40 MG Oral Tablet Oral  Vitamin D 1000 UNIT Oral Tablet Oral     GU PSH: Penile Injection - 07/13/2021, 01/20/2021, 2020       PSH Notes: Tonsillectomy   NON-GU PSH: Bmi<30 And >=22 Calc & Docu - 2018 Doc Meds Verified W/Pt Or Re - 2018 Other Pt/Ot Current Status - 2018 Other Pt/Ot D/C Status - 2018 Other Pt/Ot Goal Status - 2018, 2018 Pain Doc Pos And Plan - 2018 Remove Tonsils - 2008     GU PMH: BPH w/LUTS - 07/13/2021, - 05/31/2021, - 01/20/2021 ED due to arterial insufficiency - 07/13/2021, - 05/31/2021, - 01/20/2021, - 03/11/2020, Patient did not have success doing IC I at home. He has tried PDE 5 inhibitors without great success. I did discuss use suppository and this is available at the [place]., - 2021 Incomplete bladder emptying - 07/13/2021, - 05/31/2021, - 01/20/2021, - 03/11/2020 Post-void dribbling - 05/31/2021, - 03/11/2020 Weak Urinary Stream - 01/20/2021 BPH w/o LUTS - 03/11/2020, Benign prostatic hypertrophy without lower urinary tract symptoms, - 2014 Primary  hypogonadism - 03/11/2020, (Stable), His primary care physician monitors and prescribed AndroGel., - 2021 (Stable), Patient is using AndroGel 3 pumps a day with good success. Last testosterone checked reportedly in the 500s. And patient has excellent libido. This is managed by his primary care provider., - 2020 Urinary Frequency - 03/11/2020 Pelvic/perineal pain (Stable), Pain has been intermittent in the right groin and testes for years. We discussed management options which include pelvic floor physical therapy, conservative treatment was scrotal support in NSAIDs, and spermatic cord block. He does not feel like pelvic floor physical therapy has helped him. He is not interested in wearing a jockstrap. He will consider spermatic cord block and will let me know if he would like to proceed with this. - 2021, - 2018 Renal cyst, These are incidental findings on renal ultrasound do not have concerning features per the radiology report. - 2021 Encounter for Prostate Cancer screening (Stable), Shared decision making to close the patient and elected to perform DRE today. Discussed the risks and benefits of obtaining PSA at age 63. Labs were not obtained. DRE today within normal limits no prostate nodules. - 2020, Prostate cancer screening, - 2016 Male ED, unspecified (Worsening), Discussed pharmacologic intervention with patient for ED including PD 5 inhibitors, and she threw suppositories, and ICA. Also discussed surgical intervention with a penile prosthetic. Patient is interested in daily Cialis and will have a tele visit with his [place] where he can  get it likely approved for free. - 2020 Left testicular pain - 2018 Right testicular pain - 2018 Gross hematuria, Gross hematuria - 2016 Lower abdominal pain, unspecified, Chronic groin pain - 2016, Lower abdominal pain, - 2014 Inflammatory Disease Prostate, Unspec, Prostatitis - 2014      PMH Notes:  2006-10-31 15:57:27 - Note: Panic Disorder With  Agoraphobia  2006-10-31 15:57:27 - Note: Arthritis   NON-GU PMH: Muscle weakness (generalized) - 2018, - 2018, - 2018, - 2018 Other muscle spasm - 2018, - 2018, - 2018, - 2018 Other lack of coordination - 2018 Encounter for general adult medical examination without abnormal findings, Encounter for preventive health examination - 2016 Pulmonary Embolism, History, History of pulmonary embolism - 2016 Anxiety, Anxiety - 2014 Fibromyalgia, Fibromyalgia - 2014 Personal history of other endocrine, nutritional and metabolic disease, History of hypercholesterolemia - 2014 Personal history of other mental and behavioral disorders, History of depression - 2014    FAMILY HISTORY: Acute Myocardial Infarction - Brother, Mother, Father Death In The Family Father - Runs In Family Death In The Family Mother - Runs In Family   SOCIAL HISTORY: Marital Status: Married Preferred Language: English; Ethnicity: Not Hispanic Or Latino; Race: White Current Smoking Status: Patient has never smoked.   Tobacco Use Assessment Completed: Used Tobacco in last 30 days? Has never drank.      Notes: Never A Smoker, Caffeine Use, Retired From Work, Marital History - Currently Married, Tobacco Use, Alcohol Use   REVIEW OF SYSTEMS:    GU Review Male:   Patient denies frequent urination, hard to postpone urination, burning/ pain with urination, get up at night to urinate, leakage of urine, stream starts and stops, trouble starting your stream, have to strain to urinate , erection problems, and penile pain.  Gastrointestinal (Upper):   Patient denies nausea, vomiting, and indigestion/ heartburn.  Gastrointestinal (Lower):   Patient denies diarrhea and constipation.  Constitutional:   Patient denies fever, night sweats, weight loss, and fatigue.  Skin:   Patient denies skin rash/ lesion and itching.  Eyes:   Patient denies blurred vision and double vision.  Ears/ Nose/ Throat:   Patient denies sore throat and sinus  problems.  Hematologic/Lymphatic:   Patient denies swollen glands and easy bruising.  Cardiovascular:   Patient denies leg swelling and chest pains.  Respiratory:   Patient denies cough and shortness of breath.  Endocrine:   Patient denies excessive thirst.  Musculoskeletal:   Patient denies back pain and joint pain.  Neurological:   Patient denies headaches and dizziness.  Psychologic:   Patient denies depression and anxiety.   VITAL SIGNS: None   MULTI-SYSTEM PHYSICAL EXAMINATION:    Constitutional: Well-nourished. No physical deformities. Normally developed. Good grooming.  Neck: Neck symmetrical, not swollen. Normal tracheal position.  Respiratory: No labored breathing, no use of accessory muscles.   Skin: No paleness, no jaundice, no cyanosis. No lesion, no ulcer, no rash.  Neurologic / Psychiatric: Oriented to time, oriented to place, oriented to person. No depression, no anxiety, no agitation.  Eyes: Normal conjunctivae. Normal eyelids.  Ears, Nose, Mouth, and Throat: Left ear no scars, no lesions, no masses. Right ear no scars, no lesions, no masses. Nose no scars, no lesions, no masses. Normal hearing. Normal lips.  Musculoskeletal: Normal gait and station of head and neck.     Complexity of Data:  Records Review:   Previous Patient Records, POC Tool  Urine Test Review:   Urinalysis   06/16/14 07/11/11  PSA  Total PSA 0.65  0.87     04/19/04  Hormones  Testosterone, Total 1.74    Notes:                     09/10/2019: BUN 20, creatinine 1.07   03/22/2018: PSA 0.8   PROCEDURES:         Flexible Cystoscopy - 52000  Risks, benefits, and some of the potential complications of the procedure were discussed at length with the patient including infection, bleeding, voiding discomfort, urinary retention, fever, chills, sepsis, and others. All questions were answered. Informed consent was obtained. Sterile technique and intraurethral analgesia were used.  Meatus:  Normal size.  Normal location. Normal condition.  Urethra:  No strictures.  External Sphincter:  Normal.  Verumontanum:  Normal.  Prostate:  Obstructing lateral lobes. No significant median lobe but there is protrusion into bladder seen on retroflexion.  Bladder Neck:  Non-obstructing.  Ureteral Orifices:  Normal location. Normal size. Normal shape. Effluxed clear urine.  Bladder:  No trabeculation. No tumors. Normal mucosa. No stones.      The lower urinary tract was carefully examined. The procedure was well-tolerated and without complications. Antibiotic instructions were given. Instructions were given to call the office immediately for bloody urine, difficulty urinating, urinary retention, painful or frequent urination, fever, chills, nausea, vomiting or other illness. The patient stated that he understood these instructions and would comply with them.        PVR Ultrasound - 53202  Scanned Volume: 19 cc         Urinalysis w/Scope Dipstick Dipstick Cont'd Micro  Color: Amber Bilirubin: Neg mg/dL WBC/hpf: 0 - 5/hpf  Appearance: Clear Ketones: Trace mg/dL RBC/hpf: 0 - 2/hpf  Specific Gravity: 1.030 Blood: Neg ery/uL Bacteria: Few (10-25/hpf)  pH: <=5.0 Protein: 1+ mg/dL Cystals: NS (Not Seen)  Glucose: Neg mg/dL Urobilinogen: 1.0 mg/dL Casts: NS (Not Seen)    Nitrites: Neg Trichomonas: Not Present    Leukocyte Esterase: Neg leu/uL Mucous: Present      Epithelial Cells: 0 - 5/hpf      Yeast: NS (Not Seen)      Sperm: Not Present    Notes: verified by repeat analysis    ASSESSMENT:      ICD-10 Details  1 GU:   BPH w/LUTS - N40.1 Chronic, Stable  2   Incomplete bladder emptying - R39.14 Chronic, Stable  3   Post-void dribbling - N39.43 Chronic, Stable   PLAN:           Document Letter(s):  Created for Patient: Clinical Summary         Notes:   BPH with LUTs:  -Cystoscopy confirmed lateral lobe hypertrophy consistent with bladder outlet obstruction  -We discussed risks and benefits of  TURP including but not limited to pain, bleeding, infection, worsened urgency, dysuria, prostate regrowth, bladder neck contracture, failure to improve symptoms  -Patient to stay overnight in hospital for observation and keep catheter for 2 days  -He will be scheduled for next available date

## 2021-09-13 NOTE — Anesthesia Procedure Notes (Signed)
Procedure Name: LMA Insertion Date/Time: 09/13/2021 9:36 AM  Performed by: Suan Halter, CRNAPre-anesthesia Checklist: Patient identified, Emergency Drugs available, Suction available and Patient being monitored Patient Re-evaluated:Patient Re-evaluated prior to induction Oxygen Delivery Method: Circle system utilized Preoxygenation: Pre-oxygenation with 100% oxygen Induction Type: IV induction Ventilation: Mask ventilation without difficulty LMA: LMA inserted LMA Size: 4.0 Number of attempts: 1 Airway Equipment and Method: Bite block Placement Confirmation: positive ETCO2 Tube secured with: Tape Dental Injury: Teeth and Oropharynx as per pre-operative assessment

## 2021-09-13 NOTE — Interval H&P Note (Signed)
History and Physical Interval Note:  09/13/2021 9:11 AM  Eric Rice  has presented today for surgery, with the diagnosis of BENIGN PROSTATIC HYPERPLASIA.  The various methods of treatment have been discussed with the patient and family. After consideration of risks, benefits and other options for treatment, the patient has consented to  Procedure(s): TRANSURETHRAL RESECTION OF THE PROSTATE (TURP) (N/A) as a surgical intervention.  The patient's history has been reviewed, patient examined, no change in status, stable for surgery.  I have reviewed the patient's chart and labs.  Questions were answered to the patient's satisfaction.     [name]

## 2021-09-13 NOTE — Op Note (Signed)
Preoperative diagnosis: Bladder outlet obstruction secondary to BPH  Postoperative diagnosis:  Bladder outlet obstruction secondary to BPH  Procedure:  Cystoscopy Transurethral resection of the prostate  Surgeon: Jacalyn Lefevre, MD  Anesthesia: General  Complications: None  EBL: Minimal  Findings: Normal anterior urethra Bilateral lateral lobe hypertrophy Bilateral orthotopic Uos seen at start and end of case Normal bladder mucosa without masses or stones; mild trabeculation  Specimens: Prostate chips  Indication: Eric Rice is a patient with bladder outlet obstruction secondary to benign prostatic hyperplasia. After reviewing the management options for treatment, he elected to proceed with the above surgical procedure(s). We have discussed the potential benefits and risks of the procedure, side effects of the proposed treatment, the likelihood of the patient achieving the goals of the procedure, and any potential problems that might occur during the procedure or recuperation. Informed consent has been obtained.  Description of procedure:  The patient was taken to the operating room and general anesthesia was induced.  The patient was placed in the dorsal lithotomy position, prepped and draped in the usual sterile fashion, and preoperative antibiotics were administered. A preoperative time-out was performed.   Cystourethroscopy was performed.  The patient's urethra was examined and demonstrated bilobar prostatic hypertrophy. The bladder was then systematically examined in its entirety. There was no evidence of any bladder tumors, stones, or other mucosal pathology.  The ureteral orifices were identified and marked so as to be avoided during the procedure.  The prostate adenoma was then resected utilizing loop cautery resection with the bipolar cutting loop.  The prostate adenoma from the bladder neck back to the verumontanum was resected beginning at the six o'clock position  and then extended to include the right and left lobes of the prostate and anterior prostate. Care was taken not to resect distal to the verumontanum.   Hemostasis was then achieved with the cautery and the bladder was emptied and reinspected with no significant bleeding noted at the end of the procedure.    A 22Fr 3-way catheter was then placed into the bladder.  The patient appeared to tolerate the procedure well and without complications.  The patient was able to be awakened and transferred to the recovery unit in satisfactory condition.

## 2021-09-13 NOTE — Anesthesia Postprocedure Evaluation (Signed)
Anesthesia Post Note  Patient: Eric Rice  Procedure(s) Performed: TRANSURETHRAL RESECTION OF THE PROSTATE (TURP) (Prostate)     Patient location during evaluation: PACU Anesthesia Type: General Level of consciousness: awake and alert Pain management: pain level controlled Vital Signs Assessment: post-procedure vital signs reviewed and stable Respiratory status: spontaneous breathing, nonlabored ventilation and respiratory function stable Cardiovascular status: stable and blood pressure returned to baseline Anesthetic complications: no   No notable events documented.  Last Vitals:  Vitals:   09/13/21 1122 09/13/21 1130  BP:  (!) 153/91  Pulse: 84 85  Resp: 14 15  Temp: 37.2 C   SpO2: 95% 96%    Last Pain:  Vitals:   09/13/21 1115  TempSrc:   PainSc: 0-No pain                 Audry Pili

## 2021-09-13 NOTE — Anesthesia Preprocedure Evaluation (Addendum)
Anesthesia Evaluation  Patient identified by MRN, date of birth, ID band Patient awake    Reviewed: Allergy & Precautions, NPO status , Patient's Chart, lab work & pertinent test results  History of Anesthesia Complications Negative for: history of anesthetic complications  Airway Mallampati: II  TM Distance: >3 FB Neck ROM: Full    Dental  (+) Dental Advisory Given   Pulmonary PE   Pulmonary exam normal        Cardiovascular negative cardio ROS Normal cardiovascular exam     Neuro/Psych PSYCHIATRIC DISORDERS Anxiety Depression negative neurological ROS     GI/Hepatic negative GI ROS, Neg liver ROS,   Endo/Other  negative endocrine ROS Obesity   Renal/GU negative Renal ROS     Musculoskeletal  (+) Arthritis , Fibromyalgia -  Abdominal   Peds  Hematology negative hematology ROS (+)   Anesthesia Other Findings   Reproductive/Obstetrics                            Anesthesia Physical Anesthesia Plan  ASA: 2  Anesthesia Plan: General   Post-op Pain Management: Minimal or no pain anticipated   Induction: Intravenous  PONV Risk Score and Plan: 2 and Treatment may vary due to age or medical condition, Ondansetron and Dexamethasone  Airway Management Planned: LMA  Additional Equipment: None  Intra-op Plan:   Post-operative Plan: Extubation in OR  Informed Consent: I have reviewed the patients History and Physical, chart, labs and discussed the procedure including the risks, benefits and alternatives for the proposed anesthesia with the patient or authorized representative who has indicated his/her understanding and acceptance.     Dental advisory given  Plan Discussed with: CRNA and Anesthesiologist  Anesthesia Plan Comments:        Anesthesia Quick Evaluation

## 2021-09-13 NOTE — Transfer of Care (Signed)
Immediate Anesthesia Transfer of Care Note  Patient: Eric Rice  Procedure(s) Performed: Procedure(s) (LRB): TRANSURETHRAL RESECTION OF THE PROSTATE (TURP) (N/A)  Patient Location: PACU  Anesthesia Type: General  Level of Consciousness: awake, oriented, sedated and patient cooperative  Airway & Oxygen Therapy: Patient Spontanous Breathing and Patient connected to face mask oxygen  Post-op Assessment: Report given to PACU RN and Post -op Vital signs reviewed and stable  Post vital signs: Reviewed and stable  Complications: No apparent anesthesia complications Last Vitals:  Vitals Value Taken Time  BP 152/103 09/13/21 1047  Temp    Pulse 87 09/13/21 1052  Resp 16 09/13/21 1052  SpO2 95 % 09/13/21 1052  Vitals shown include unvalidated device data.  Last Pain:  Vitals:   09/13/21 0811  TempSrc: Oral  PainSc: 0-No pain      Patients Stated Pain Goal: 5 (85/63/14 9702)  Complications: No notable events documented.

## 2021-09-13 NOTE — Discharge Instructions (Signed)

## 2021-09-14 DIAGNOSIS — N320 Bladder-neck obstruction: Secondary | ICD-10-CM | POA: Diagnosis not present

## 2021-09-14 DIAGNOSIS — N401 Enlarged prostate with lower urinary tract symptoms: Secondary | ICD-10-CM | POA: Diagnosis not present

## 2021-09-14 DIAGNOSIS — N138 Other obstructive and reflux uropathy: Secondary | ICD-10-CM | POA: Diagnosis not present

## 2021-09-14 DIAGNOSIS — N3943 Post-void dribbling: Secondary | ICD-10-CM | POA: Diagnosis not present

## 2021-09-14 DIAGNOSIS — R338 Other retention of urine: Secondary | ICD-10-CM | POA: Diagnosis not present

## 2021-09-14 DIAGNOSIS — Z79899 Other long term (current) drug therapy: Secondary | ICD-10-CM | POA: Diagnosis not present

## 2021-09-14 LAB — SURGICAL PATHOLOGY

## 2021-09-14 MED ORDER — TRAMADOL HCL 50 MG PO TABS: 50.0000 mg | ORAL_TABLET | Freq: Four times a day (QID) | ORAL | 0 refills | Status: DC | PRN

## 2021-09-14 MED ORDER — ACETAMINOPHEN 500 MG PO TABS
ORAL_TABLET | ORAL | Status: AC
  Filled 2021-09-14: qty 2

## 2021-09-14 MED ORDER — OXYCODONE HCL 5 MG PO TABS
ORAL_TABLET | ORAL | Status: AC
  Filled 2021-09-14: qty 1

## 2021-09-14 MED ORDER — CEFAZOLIN SODIUM-DEXTROSE 2-4 GM/100ML-% IV SOLN
INTRAVENOUS | Status: AC
  Filled 2021-09-14: qty 100

## 2021-09-14 MED ORDER — CEPHALEXIN 500 MG PO CAPS: 500.0000 mg | ORAL_CAPSULE | Freq: Every day | ORAL | 0 refills | Status: AC

## 2021-09-14 NOTE — Discharge Summary (Signed)
Date of admission: 09/13/2021  Date of discharge: 09/14/2021  Admission diagnosis: BPH with bladder outlet obstruction  Discharge diagnosis: BPH with bladder outlet obstruction  Secondary diagnoses:  Patient Active Problem List   Diagnosis Date Noted   BPH with obstruction/lower urinary tract symptoms 09/13/2021   Cervical spondylosis 11/02/2020   Bilateral shoulder pain 11/02/2020   Bilateral hip pain 07/31/2017   Fatigue 03/26/2017   Medicare annual wellness visit, subsequent 01/09/2017   DOE (dyspnea on exertion) 07/20/2015   History of pulmonary embolus (PE) 03/29/2015   Hyperlipidemia 01/08/2014   Coronary atherosclerosis of native coronary artery 01/05/2014   Abnormal ultrasound of carotid artery 08/15/2013   Polymyalgia ([place]) 08/15/2013   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   Excess weight 08/04/2013   Diuresis excessive 08/04/2013   Blood pressure elevated without history of HTN 01/31/2013   Nocturia 11/19/2012   Fibromyalgia 11/19/2012   Low testosterone 05/29/2012   Acute pulmonary embolism ([place]) 11/30/2011   Long term use of drug 11/28/2010   DIVERTICULAR DISEASE 03/09/2010   Clinical depression 07/30/2008   Elevated prostate specific antigen (PSA) 07/30/2008   Neurosis, 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 Briefly, Eric Rice is a 76 y.o. year old patient with BPH with bladder outlet obstruction.   No overnight events.  Pain well controlled.  NAD, alert and oriented Nonlabored respirations Foley with CBI clamped, clear yellow urine in tubing, no clots  Hospital Course: Patient tolerated the procedure well.  He was then transferred to the floor after an uneventful PACU stay.  His hospital course was uncomplicated.  On POD#1 he had met discharge criteria: was eating a regular diet, was up and ambulating  independently,  pain was well controlled, catheter was clear with CBI turned off and was ready to for discharge.   Laboratory values:  No results for input(s): "WBC", "HGB", "HCT" in the last 72 hours. No results for input(s): "NA", "K", "CL", "CO2", "GLUCOSE", "BUN", "CREATININE", "CALCIUM" in the last 72 hours. No results for input(s): "LABPT", "INR" in the last 72 hours. No results for input(s): "LABURIN" in the last 72 hours. Results for orders placed or performed in visit on 03/18/15  Microscopic Examination     Status: Abnormal   Collection Time: 03/18/15  1:42 PM   URINE  Result Value Ref Range Status   WBC, UA 0-5 0 - 5 /hpf Final   RBC, UA None seen 0 - 2 /hpf Final   Epithelial Cells (non renal) None seen 0 - 10 /hpf Final   Mucus, UA Present (A) Not Estab. Final   Bacteria, UA Few (A) None seen/Few Final    Disposition: Home  Discharge instruction: The patient was instructed to be ambulatory but told to refrain from heavy lifting, strenuous activity, or driving.   Discharge medications:  Allergies as of 09/14/2021   No Known Allergies      Medication List     TAKE these medications    aspirin 81 MG tablet Take 81 mg by mouth at bedtime.   calcium carbonate 600 MG Tabs tablet Commonly known as: OS-CAL Take 600 mg by mouth every morning.   CENTRUM SILVER PO Take by mouth.   cephALEXin 500 MG capsule Commonly known as: KEFLEX Take 1 capsule (500 mg total) by mouth daily for 3 doses.   cholecalciferol 1000 units tablet Commonly known as: VITAMIN D Take 1,000 Units by  mouth every morning.   Drysol 20 % external solution Generic drug: aluminum chloride APPLY TO AFFECTED AREA AT BEDTIME AS NEEDED   PARoxetine 20 MG tablet Commonly known as: PAXIL Take 20 mg by mouth every morning.   simvastatin 40 MG tablet Commonly known as: ZOCOR Take 40 mg by mouth at bedtime.   Testosterone 20.25 MG/1.25GM (1.62%) Gel 3 pumps to skin every day Please dispense  quantity needed for one month   traMADol 50 MG tablet Commonly known as: Ultram Take 1 tablet (50 mg total) by mouth every 6 (six) hours as needed.        Followup:   Follow-up Information     [facility] Follow up on 09/15/2021.   Why: 11:00am Contact information: [place] [phone]

## 2021-09-15 ENCOUNTER — Encounter (HOSPITAL_BASED_OUTPATIENT_CLINIC_OR_DEPARTMENT_OTHER): Payer: Self-pay | Admitting: Urology

## 2021-09-27 DIAGNOSIS — R8271 Bacteriuria: Secondary | ICD-10-CM | POA: Diagnosis not present

## 2021-09-27 DIAGNOSIS — N3000 Acute cystitis without hematuria: Secondary | ICD-10-CM | POA: Diagnosis not present

## 2021-09-27 DIAGNOSIS — N401 Enlarged prostate with lower urinary tract symptoms: Secondary | ICD-10-CM | POA: Diagnosis not present

## 2021-09-27 DIAGNOSIS — N3943 Post-void dribbling: Secondary | ICD-10-CM | POA: Diagnosis not present

## 2021-10-03 DIAGNOSIS — L570 Actinic keratosis: Secondary | ICD-10-CM | POA: Diagnosis not present

## 2021-10-18 DIAGNOSIS — R8271 Bacteriuria: Secondary | ICD-10-CM | POA: Diagnosis not present

## 2021-10-18 DIAGNOSIS — R3914 Feeling of incomplete bladder emptying: Secondary | ICD-10-CM | POA: Diagnosis not present

## 2021-11-02 DIAGNOSIS — N3000 Acute cystitis without hematuria: Secondary | ICD-10-CM | POA: Diagnosis not present

## 2021-11-09 DIAGNOSIS — R8279 Other abnormal findings on microbiological examination of urine: Secondary | ICD-10-CM | POA: Diagnosis not present

## 2021-11-09 DIAGNOSIS — R3914 Feeling of incomplete bladder emptying: Secondary | ICD-10-CM | POA: Diagnosis not present

## 2021-11-25 DIAGNOSIS — D485 Neoplasm of uncertain behavior of skin: Secondary | ICD-10-CM | POA: Diagnosis not present

## 2021-11-25 DIAGNOSIS — L821 Other seborrheic keratosis: Secondary | ICD-10-CM | POA: Diagnosis not present

## 2021-11-29 DIAGNOSIS — R42 Dizziness and giddiness: Secondary | ICD-10-CM | POA: Diagnosis not present

## 2021-12-01 DIAGNOSIS — R3914 Feeling of incomplete bladder emptying: Secondary | ICD-10-CM | POA: Diagnosis not present

## 2021-12-01 DIAGNOSIS — N401 Enlarged prostate with lower urinary tract symptoms: Secondary | ICD-10-CM | POA: Diagnosis not present

## 2021-12-01 DIAGNOSIS — R350 Frequency of micturition: Secondary | ICD-10-CM | POA: Diagnosis not present

## 2021-12-06 ENCOUNTER — Other Ambulatory Visit: Payer: Self-pay | Admitting: Urology

## 2021-12-06 ENCOUNTER — Encounter (HOSPITAL_BASED_OUTPATIENT_CLINIC_OR_DEPARTMENT_OTHER): Payer: Self-pay | Admitting: Urology

## 2021-12-06 NOTE — Progress Notes (Signed)
Spoke w/ via phone for pre-op interview--- pt Lab needs dos----    no           Lab results------ no COVID test -----patient states asymptomatic no test needed Arrive at ------- 0945 on 12-13-2021 NPO after MN NO Solid Food.  Clear liquids from MN until--- 0845 Med rec completed Medications to take morning of surgery ----- paxil Diabetic medication ----- n/a Patient instructed no nail polish to be worn day of surgery Patient instructed to bring photo id and insurance card day of surgery Patient aware to have Driver (ride ) / caregiver for 24 hours after surgery --- wife, susan Patient Special Instructions ----- n/a Pre-Op special Istructions ----- n/a Patient verbalized understanding of instructions that were given at this phone interview. Patient denies shortness of breath, chest pain, fever, cough at this phone interview.

## 2021-12-09 NOTE — H&P (Signed)
CC/HPI: cc: BPH with LUTs   08/02/21: 76 year old man with a history of BPH with lower urinary tract symptoms, ED and low testosterone here for follow-up. Patient had significant adverse side effects to tamsulosin, Uroxatrol and finasteride. He continues to be bothered by urinary symptoms. Specifically he has a weak stream, intermittency and has to strain to void. He is here today for cysto, flowrate/PVR.   09/15/2021: 76 year old man who underwent a TURP approximately 1 week ago presents today for trial of void. He has been doing well with the catheter but is ready to have it removed. He denies large amounts of clots in his catheter bag. He has had some pink-tinged urine. Catheter has been draining well.   09/27/2021: 76 year old man who presents today for 1 week office visit and PVR. He reports that he feels he is emptying his bladder well. He has noticed a recurrence of gross hematuria associated with suprapubic pain and pressure. This began this morning as well as some intermittent dizziness. His urinalysis is concerning for infection.   10/18/2021: 76 year old man who presents for office visit after undergoing a TURP 5 weeks ago. He is still having some dysuria. His urinary frequency and urgency has improved. He does endorse nocturia which has been bothersome. He did start taking ZzzQuil and reports that his nocturia has been better the last 3 nights and he has been able to sleep through the night. He denies any fevers or chills. He is having some right-sided groin pain that he describes as sharp and intermittent. The pain is not reproducible with touch.   11/02/2021:  Patient present for f/u 7 weeks s/p TURP with complaints of irritative symptoms for several days. He endorses dysuria (10/10 pain), increased frequency, nocturia x5, straining, urgency, and some leaking. Last seen on 8/29, UCx negative at that time. He denies flank pain, n/v, fevers/chills, blood in urine, or passage of stone material. He  is concerned that these symptoms are his new baseline s/p TURP.   11/09/2021: 76 year old man with a history of BPH with LUTS underwent TURP July 2023 with persistent dysuria, frequency and nocturia here for follow-up. He has been treated with multiple antibiotics over the last 6 weeks. Urine cultures have been negative. Patient is most bothered by the nocturia and the episodic urgency/feeling of incomplete emptying. Urinalysis today does show WBCs which is consistent with recent TURP and inflammation.   12/01/2021: Here for cystoscopy. He has had varying nights where he may sleep through the night and not wake up at all versus go 4-5 times a night.     ALLERGIES: Doxycycline Hyclate TABS    MEDICATIONS: Androgel  Aspirin 81 MG TABS Oral  Calcium 600 MG Oral Tablet Oral  Centrum Silver tablet Oral  Paroxetine Hcl 20 mg tablet Oral  Simvastatin 40 MG Oral Tablet Oral  Vitamin D 1000 UNIT Oral Tablet Oral     GU PSH: Cystoscopy - 08/02/2021 Cystoscopy TURP - 09/13/2021 Penile Injection - 07/13/2021, 01/20/2021, 2020       PSH Notes: Tonsillectomy   NON-GU PSH: Bmi<30 And >=22 Calc & Docu - 2018 Doc Meds Verified W/Pt Or Re - 2018 Other Pt/Ot Current Status - 2018 Other Pt/Ot D/C Status - 2018 Other Pt/Ot Goal Status - 2018, 2018 Pain Doc Pos And Plan - 2018 Remove Tonsils - 2008     GU PMH: BPH w/LUTS - 11/09/2021, - 11/02/2021, - 10/18/2021, - 09/27/2021, - 09/15/2021, - 08/02/2021, - 07/13/2021, - 05/31/2021, - 01/20/2021 Incomplete bladder emptying - 11/09/2021, -  08/02/2021, - 07/13/2021, - 05/31/2021, - 01/20/2021, - 2022 Acute Cystitis/UTI - 11/02/2021, - 09/27/2021 Post-void dribbling - 08/02/2021, - 05/31/2021, - 2022 ED due to arterial insufficiency - 07/13/2021, - 05/31/2021, - 01/20/2021, - 2022, Patient did not have success doing IC I at home. He has tried PDE 5 inhibitors without great success. I did discuss use suppository and this is available at the [place]., - 2021 Weak Urinary Stream -  01/20/2021 BPH w/o LUTS - 2022, Benign prostatic hypertrophy without lower urinary tract symptoms, - 2014 Primary hypogonadism - 2022, (Stable), His primary care physician monitors and prescribed AndroGel., - 2021 (Stable), Patient is using AndroGel 3 pumps a day with good success. Last testosterone checked reportedly in the 500s. And patient has excellent libido. This is managed by his primary care provider., - 2020 Urinary Frequency - 2022 Pelvic/perineal pain (Stable), Pain has been intermittent in the right groin and testes for years. We discussed management options which include pelvic floor physical therapy, conservative treatment was scrotal support in NSAIDs, and spermatic cord block. He does not feel like pelvic floor physical therapy has helped him. He is not interested in wearing a jockstrap. He will consider spermatic cord block and will let me know if he would like to proceed with this. - 2021, - 2018 Renal cyst, These are incidental findings on renal ultrasound do not have concerning features per the radiology report. - 2021 Encounter for Prostate Cancer screening (Stable), Shared decision making to close the patient and elected to perform DRE today. Discussed the risks and benefits of obtaining PSA at age 29. Labs were not obtained. DRE today within normal limits no prostate nodules. - 2020, Prostate cancer screening, - 2016 Male ED, unspecified (Worsening), Discussed pharmacologic intervention with patient for ED including PD 5 inhibitors, and she threw suppositories, and ICA. Also discussed surgical intervention with a penile prosthetic. Patient is interested in daily Cialis and will have a tele visit with his [facility] where he can get it likely approved for free. 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  2006-10-31 15:57:27 - Note: Arthritis   NON-GU PMH: Muscle weakness (generalized) - 2018, - 2018, - 2018, - 2018 Other muscle spasm - 2018, - 2018, - 2018, - 2018 Other lack of coordination - 2018 Encounter for general adult medical examination without abnormal findings, Encounter for preventive health examination - 2016 Pulmonary Embolism, History, History of pulmonary embolism - 2016 Anxiety, Anxiety - 2014 Fibromyalgia, Fibromyalgia - 2014 Personal history of other endocrine, nutritional and metabolic disease, History of hypercholesterolemia - 2014 Personal history of other mental and behavioral disorders, History of depression - 2014    FAMILY HISTORY: Acute Myocardial Infarction - Brother, Mother, Father Death In The Family Father - Runs In Family Death In The Family Mother - Runs In Family   SOCIAL HISTORY: Marital Status: Married Preferred Language: English; Ethnicity: Not Hispanic Or Latino; Race: White Current Smoking Status: Patient has never smoked.   Tobacco Use Assessment Completed: Used Tobacco in last 30 days? Has never drank.      Notes: Never A Smoker, Caffeine Use, Retired From Work, Marital History - Currently Married, Tobacco Use, Alcohol Use   REVIEW OF SYSTEMS:    GU  Review Male:   Patient reports frequent urination and burning/ pain with urination. Patient denies hard to postpone urination, get up at night to urinate, leakage of urine, stream starts and stops, trouble starting your stream, have to strain to urinate , erection problems, and penile pain.  Gastrointestinal (Upper):   Patient denies nausea, vomiting, and indigestion/ heartburn.  Gastrointestinal (Lower):   Patient denies diarrhea and constipation.  Constitutional:   Patient denies fever, night sweats, weight loss, and fatigue.  Skin:   Patient denies skin rash/ lesion and itching.   Eyes:   Patient denies blurred vision and double vision.  Ears/ Nose/ Throat:   Patient denies sore throat and sinus problems.  Hematologic/Lymphatic:   Patient denies swollen glands and easy bruising.  Cardiovascular:   Patient denies leg swelling and chest pains.  Respiratory:   Patient denies cough and shortness of breath.  Endocrine:   Patient denies excessive thirst.  Musculoskeletal:   Patient denies back pain and joint pain.  Neurological:   Patient denies headaches and dizziness.  Psychologic:   Patient denies depression and anxiety.   VITAL SIGNS: None   MULTI-SYSTEM PHYSICAL EXAMINATION:    Constitutional: Well-nourished. No physical deformities. Normally developed. Good grooming.  Neck: Neck symmetrical, not swollen. Normal tracheal position.  Respiratory: No labored breathing, no use of accessory muscles.   Skin: No paleness, no jaundice, no cyanosis. No lesion, no ulcer, no rash.  Neurologic / Psychiatric: Oriented to time, oriented to place, oriented to person. No depression, no anxiety, no agitation.  Eyes: Normal conjunctivae. Normal eyelids.  Ears, Nose, Mouth, and Throat: Left ear no scars, no lesions, no masses. Right ear no scars, no lesions, no masses. Nose no scars, no lesions, no masses. Normal hearing. Normal lips.  Musculoskeletal: Normal gait and station of head and neck.     Complexity of Data:  Records Review:   Previous Patient Records, POC Tool  Urine Test Review:   Urinalysis   06/16/14 07/11/11  PSA  Total PSA 0.65  0.87     04/19/04  Hormones  Testosterone, Total 1.74     PROCEDURES:         Flexible Cystoscopy - 52000  Risks, benefits, and some of the potential complications of the procedure were discussed at length with the patient including infection, bleeding, voiding discomfort, urinary retention, fever, chills, sepsis, and others. All questions were answered. Informed consent was obtained. Sterile technique and intraurethral analgesia were  used.  Meatus:  Normal size. Normal location. Normal condition.  Urethra:  No strictures.  External Sphincter:  Normal.  Verumontanum:  Normal.  Prostate:  Evidence of prior TURP, fibrinous exduate on left distal lateral lobe, bladder neck wide open  Bladder Neck:  Non-obstructing.  Ureteral Orifices:  Normal location. Normal size. Normal shape. Effluxed clear urine.  Bladder:  No trabeculation. No tumors. Normal mucosa. No stones.      The lower urinary tract was carefully examined. The procedure was well-tolerated and without complications. Antibiotic instructions were given. Instructions were given to call the office immediately for bloody urine, difficulty urinating, urinary retention, painful or frequent urination, fever, chills, nausea, vomiting or other illness. The patient stated that he understood these instructions and would comply with them.         Urinalysis w/Scope Dipstick Dipstick Cont'd Micro  Color: Yellow Bilirubin: Neg mg/dL WBC/hpf: 6 - 10/hpf  Appearance: Clear Ketones: Trace mg/dL RBC/hpf: 10 - 20/hpf  Specific Gravity: 1.025 Blood: 3+ ery/uL Bacteria: Rare (0-9/hpf)  pH: 5.5 Protein: 1+ mg/dL Cystals: NS (Not Seen)  Glucose: Neg mg/dL Urobilinogen: 1.0 mg/dL Casts: NS (Not Seen)    Nitrites: Neg Trichomonas: Not Present    Leukocyte Esterase: 2+ leu/uL Mucous: Not Present      Epithelial Cells: 0 - 5/hpf      Yeast: NS (Not Seen)      Sperm: Not Present    Notes: unspun specimen    ASSESSMENT:      ICD-10 Details  1 GU:   BPH w/LUTS - N40.1 Chronic, Stable  2   Incomplete bladder emptying - R39.14 Chronic, Stable  3   Urinary Frequency - R35.0 Chronic, Stable   PLAN:           Document Letter(s):  Created for Patient: Clinical Summary         Notes:   BPH with LUTs:  -cystoscopy shows fibrinous exudate on left lateral lobe that has not sloughed off and may be creating intermittent irritation/obstruction  -discussed that this will likely heal over  time or we could go back in and "freshen" up the edge to help speed things along. Risks and benefits of the procedure were discussed with the patient in detail however this will be done as an outpatient and significantly less extensive than initial TURP. Plan for no catheter  -we also discussed that this will be a new scab that will need to help and he may experience dysuria/hematuria/etc following the procedure   Schedule next available surgery date

## 2021-12-13 ENCOUNTER — Encounter (HOSPITAL_BASED_OUTPATIENT_CLINIC_OR_DEPARTMENT_OTHER)
Admission: RE | Disposition: A | Discharge status: Home / Self Care | Payer: Self-pay | Source: Ambulatory Visit | Attending: Urology

## 2021-12-13 ENCOUNTER — Ambulatory Visit (HOSPITAL_BASED_OUTPATIENT_CLINIC_OR_DEPARTMENT_OTHER)
Admission: RE | Admit: 2021-12-13 | Discharge: 2021-12-13 | Disposition: A | Discharge status: Home / Self Care | Payer: Medicare Other | Source: Ambulatory Visit | Attending: Urology | Admitting: Urology

## 2021-12-13 ENCOUNTER — Other Ambulatory Visit: Payer: Self-pay

## 2021-12-13 ENCOUNTER — Ambulatory Visit (HOSPITAL_BASED_OUTPATIENT_CLINIC_OR_DEPARTMENT_OTHER): Payer: Medicare Other | Admitting: Anesthesiology

## 2021-12-13 ENCOUNTER — Encounter (HOSPITAL_BASED_OUTPATIENT_CLINIC_OR_DEPARTMENT_OTHER): Payer: Self-pay | Admitting: Urology

## 2021-12-13 DIAGNOSIS — R339 Retention of urine, unspecified: Secondary | ICD-10-CM | POA: Diagnosis not present

## 2021-12-13 DIAGNOSIS — N400 Enlarged prostate without lower urinary tract symptoms: Secondary | ICD-10-CM

## 2021-12-13 DIAGNOSIS — Z01818 Encounter for other preprocedural examination: Secondary | ICD-10-CM

## 2021-12-13 DIAGNOSIS — R300 Dysuria: Secondary | ICD-10-CM | POA: Diagnosis not present

## 2021-12-13 DIAGNOSIS — M1990 Unspecified osteoarthritis, unspecified site: Secondary | ICD-10-CM | POA: Insufficient documentation

## 2021-12-13 DIAGNOSIS — R351 Nocturia: Secondary | ICD-10-CM | POA: Diagnosis not present

## 2021-12-13 DIAGNOSIS — N4289 Other specified disorders of prostate: Secondary | ICD-10-CM

## 2021-12-13 DIAGNOSIS — R350 Frequency of micturition: Secondary | ICD-10-CM | POA: Insufficient documentation

## 2021-12-13 DIAGNOSIS — N138 Other obstructive and reflux uropathy: Secondary | ICD-10-CM | POA: Diagnosis not present

## 2021-12-13 DIAGNOSIS — N410 Acute prostatitis: Secondary | ICD-10-CM | POA: Diagnosis not present

## 2021-12-13 DIAGNOSIS — N401 Enlarged prostate with lower urinary tract symptoms: Secondary | ICD-10-CM | POA: Insufficient documentation

## 2021-12-13 DIAGNOSIS — Z9079 Acquired absence of other genital organ(s): Secondary | ICD-10-CM | POA: Diagnosis not present

## 2021-12-13 HISTORY — DX: Male erectile dysfunction, unspecified: N52.9

## 2021-12-13 HISTORY — DX: Spinal stenosis, cervical region: M48.02

## 2021-12-13 HISTORY — DX: Post-traumatic stress disorder, unspecified: F43.10

## 2021-12-13 HISTORY — DX: Benign prostatic hyperplasia with lower urinary tract symptoms: N40.1

## 2021-12-13 HISTORY — DX: Bladder-neck obstruction: N32.0

## 2021-12-13 HISTORY — DX: Testicular hypofunction: E29.1

## 2021-12-13 HISTORY — DX: Personal history of other specified conditions: Z87.898

## 2021-12-13 HISTORY — DX: Other specified joint disorders, right shoulder: M25.811

## 2021-12-13 HISTORY — PX: TRANSURETHRAL RESECTION OF PROSTATE: SHX73

## 2021-12-13 SURGERY — TURP (TRANSURETHRAL RESECTION OF PROSTATE)
Anesthesia: General | Site: Prostate

## 2021-12-13 MED ORDER — PROPOFOL 10 MG/ML IV BOLUS
INTRAVENOUS | Status: AC
  Filled 2021-12-13: qty 20

## 2021-12-13 MED ORDER — ONDANSETRON HCL 4 MG/2ML IJ SOLN: 4.0000 mg | Freq: Once | INTRAMUSCULAR | Status: DC | PRN

## 2021-12-13 MED ORDER — CEFAZOLIN SODIUM-DEXTROSE 2-4 GM/100ML-% IV SOLN
2.0000 g | INTRAVENOUS | Status: AC
  Administered 2021-12-13: 2 g via INTRAVENOUS

## 2021-12-13 MED ORDER — LACTATED RINGERS IV SOLN: INTRAVENOUS | Status: DC

## 2021-12-13 MED ORDER — CEFAZOLIN SODIUM-DEXTROSE 2-4 GM/100ML-% IV SOLN
INTRAVENOUS | Status: AC
  Filled 2021-12-13: qty 100

## 2021-12-13 MED ORDER — CEPHALEXIN 250 MG PO CAPS: 250.0000 mg | ORAL_CAPSULE | Freq: Every day | ORAL | 0 refills | Status: AC

## 2021-12-13 MED ORDER — OXYCODONE HCL 5 MG PO TABS
5.0000 mg | ORAL_TABLET | Freq: Once | ORAL | Status: AC | PRN
  Administered 2021-12-13: 5 mg via ORAL

## 2021-12-13 MED ORDER — PROPOFOL 10 MG/ML IV BOLUS
INTRAVENOUS | Status: DC | PRN
  Administered 2021-12-13: 200 mg via INTRAVENOUS

## 2021-12-13 MED ORDER — ONDANSETRON HCL 4 MG/2ML IJ SOLN
INTRAMUSCULAR | Status: AC
  Filled 2021-12-13: qty 2

## 2021-12-13 MED ORDER — LIDOCAINE HCL (PF) 2 % IJ SOLN
INTRAMUSCULAR | Status: AC
  Filled 2021-12-13: qty 5

## 2021-12-13 MED ORDER — FENTANYL CITRATE (PF) 100 MCG/2ML IJ SOLN
INTRAMUSCULAR | Status: DC | PRN
  Administered 2021-12-13: 100 ug via INTRAVENOUS

## 2021-12-13 MED ORDER — FENTANYL CITRATE (PF) 100 MCG/2ML IJ SOLN: 25.0000 ug | INTRAMUSCULAR | Status: DC | PRN

## 2021-12-13 MED ORDER — PHENYLEPHRINE 80 MCG/ML (10ML) SYRINGE FOR IV PUSH (FOR BLOOD PRESSURE SUPPORT)
PREFILLED_SYRINGE | INTRAVENOUS | Status: AC
  Filled 2021-12-13: qty 10

## 2021-12-13 MED ORDER — SODIUM CHLORIDE 0.9 % IR SOLN
Status: DC | PRN
  Administered 2021-12-13: 6000 mL

## 2021-12-13 MED ORDER — ACETAMINOPHEN 10 MG/ML IV SOLN: 1000.0000 mg | Freq: Once | INTRAVENOUS | Status: DC | PRN

## 2021-12-13 MED ORDER — FENTANYL CITRATE (PF) 100 MCG/2ML IJ SOLN
INTRAMUSCULAR | Status: AC
  Filled 2021-12-13: qty 2

## 2021-12-13 MED ORDER — OXYCODONE HCL 5 MG PO TABS
ORAL_TABLET | ORAL | Status: AC
  Filled 2021-12-13: qty 1

## 2021-12-13 MED ORDER — OXYCODONE HCL 5 MG/5ML PO SOLN: 5.0000 mg | Freq: Once | ORAL | Status: AC | PRN

## 2021-12-13 MED ORDER — DEXAMETHASONE SODIUM PHOSPHATE 10 MG/ML IJ SOLN
INTRAMUSCULAR | Status: AC
  Filled 2021-12-13: qty 1

## 2021-12-13 MED ORDER — DEXAMETHASONE SODIUM PHOSPHATE 10 MG/ML IJ SOLN
INTRAMUSCULAR | Status: DC | PRN
  Administered 2021-12-13: 4 mg via INTRAVENOUS

## 2021-12-13 MED ORDER — TRAMADOL HCL 50 MG PO TABS: 50.0000 mg | ORAL_TABLET | Freq: Four times a day (QID) | ORAL | 0 refills | Status: DC | PRN

## 2021-12-13 MED ORDER — LIDOCAINE 2% (20 MG/ML) 5 ML SYRINGE
INTRAMUSCULAR | Status: DC | PRN
  Administered 2021-12-13: 60 mg via INTRAVENOUS

## 2021-12-13 MED ORDER — ONDANSETRON HCL 4 MG/2ML IJ SOLN
INTRAMUSCULAR | Status: DC | PRN
  Administered 2021-12-13: 4 mg via INTRAVENOUS

## 2021-12-13 MED ORDER — PHENYLEPHRINE 80 MCG/ML (10ML) SYRINGE FOR IV PUSH (FOR BLOOD PRESSURE SUPPORT)
PREFILLED_SYRINGE | INTRAVENOUS | Status: DC | PRN
  Administered 2021-12-13 (×2): 160 ug via INTRAVENOUS
  Administered 2021-12-13 (×2): 80 ug via INTRAVENOUS

## 2021-12-13 SURGICAL SUPPLY — 19 items
BAG DRAIN URO-CYSTO SKYTR STRL (DRAIN) ×1 IMPLANT
BAG DRN RND TRDRP ANRFLXCHMBR (UROLOGICAL SUPPLIES) ×1
BAG DRN UROCATH (DRAIN) ×1
BAG URINE DRAIN 2000ML AR STRL (UROLOGICAL SUPPLIES) ×1 IMPLANT
CATH FOLEY 3WAY 30CC 22FR (CATHETERS) ×1 IMPLANT
CATH HEMA 3WAY 30CC 22FR COUDE (CATHETERS) ×1 IMPLANT
CLOTH BEACON ORANGE TIMEOUT ST (SAFETY) ×1 IMPLANT
GLOVE BIO SURGEON STRL SZ 6.5 (GLOVE) ×1 IMPLANT
GOWN STRL REUS W/TWL LRG LVL3 (GOWN DISPOSABLE) ×1 IMPLANT
HOLDER FOLEY CATH W/STRAP (MISCELLANEOUS) ×1 IMPLANT
IV NS IRRIG 3000ML ARTHROMATIC (IV SOLUTION) ×2 IMPLANT
KIT TURNOVER CYSTO (KITS) ×1 IMPLANT
LOOP CUT BIPOLAR 24F LRG (ELECTROSURGICAL) IMPLANT
MANIFOLD NEPTUNE II (INSTRUMENTS) ×1 IMPLANT
PACK CYSTO (CUSTOM PROCEDURE TRAY) ×1 IMPLANT
SYR TOOMEY IRRIG 70ML (MISCELLANEOUS) ×1
SYRINGE TOOMEY IRRIG 70ML (MISCELLANEOUS) ×1 IMPLANT
TUBE CONNECTING 12X1/4 (SUCTIONS) ×1 IMPLANT
TUBING UROLOGY SET (TUBING) ×1 IMPLANT

## 2021-12-13 NOTE — Interval H&P Note (Signed)
History and Physical Interval Note:  12/13/2021 10:47 AM  Eric Rice  has presented today for surgery, with the diagnosis of BENIGN PROSTATIC HYPERTROPHY.  The various methods of treatment have been discussed with the patient and family. After consideration of risks, benefits and other options for treatment, the patient has consented to  Procedure(s) with comments: REPEAT TRANSURETHRAL RESECTION OF THE PROSTATE (TURP) (N/A) - 45 MINS as a surgical intervention.  The patient's history has been reviewed, patient examined, no change in status, stable for surgery.  I have reviewed the patient's chart and labs.  Questions were answered to the patient's satisfaction.     Cashlyn Huguley D Khushboo Chuck

## 2021-12-13 NOTE — Anesthesia Procedure Notes (Signed)
Procedure Name: LMA Insertion Date/Time: 12/13/2021 11:15 AM  Performed by: Mechele Claude, CRNAPre-anesthesia Checklist: Patient identified, Emergency Drugs available, Suction available and Patient being monitored Patient Re-evaluated:Patient Re-evaluated prior to induction Oxygen Delivery Method: Circle system utilized Preoxygenation: Pre-oxygenation with 100% oxygen Induction Type: IV induction Ventilation: Mask ventilation without difficulty LMA: LMA inserted LMA Size: 4.0 Number of attempts: 1 Placement Confirmation: positive ETCO2 and breath sounds checked- equal and bilateral Tube secured with: Tape Dental Injury: Teeth and Oropharynx as per pre-operative assessment

## 2021-12-13 NOTE — Anesthesia Preprocedure Evaluation (Signed)
Anesthesia Evaluation  Patient identified by MRN, date of birth, ID band Patient awake    Reviewed: Allergy & Precautions, NPO status , Patient's Chart, lab work & pertinent test results  Airway Mallampati: II  TM Distance: >3 FB Neck ROM: Full    Dental no notable dental hx.    Pulmonary neg pulmonary ROS,    Pulmonary exam normal breath sounds clear to auscultation       Cardiovascular negative cardio ROS Normal cardiovascular exam Rhythm:Regular Rate:Normal     Neuro/Psych negative neurological ROS  negative psych ROS   GI/Hepatic negative GI ROS, Neg liver ROS,   Endo/Other  negative endocrine ROS  Renal/GU negative Renal ROS  negative genitourinary   Musculoskeletal  (+) Arthritis ,   Abdominal   Peds negative pediatric ROS (+)  Hematology negative hematology ROS (+)   Anesthesia Other Findings   Reproductive/Obstetrics negative OB ROS                             Anesthesia Physical Anesthesia Plan  ASA: 2  Anesthesia Plan: General   Post-op Pain Management: Minimal or no pain anticipated   Induction: Intravenous  PONV Risk Score and Plan: 2 and Ondansetron, Dexamethasone and Treatment may vary due to age or medical condition  Airway Management Planned: LMA  Additional Equipment:   Intra-op Plan:   Post-operative Plan: Extubation in OR  Informed Consent: I have reviewed the patients History and Physical, chart, labs and discussed the procedure including the risks, benefits and alternatives for the proposed anesthesia with the patient or authorized representative who has indicated his/her understanding and acceptance.     Dental advisory given  Plan Discussed with: CRNA and Surgeon  Anesthesia Plan Comments:         Anesthesia Quick Evaluation

## 2021-12-13 NOTE — Anesthesia Postprocedure Evaluation (Signed)
Anesthesia Post Note  Patient: Eric Rice  Procedure(s) Performed: REPEAT TRANSURETHRAL RESECTION OF THE PROSTATE (TURP)/URETHRAL DILATION (Prostate)     Patient location during evaluation: PACU Anesthesia Type: General Level of consciousness: awake and alert Pain management: pain level controlled Vital Signs Assessment: post-procedure vital signs reviewed and stable Respiratory status: spontaneous breathing, nonlabored ventilation, respiratory function stable and patient connected to nasal cannula oxygen Cardiovascular status: blood pressure returned to baseline and stable Postop Assessment: no apparent nausea or vomiting Anesthetic complications: no   No notable events documented.  Last Vitals:  Vitals:   12/13/21 1228 12/13/21 1230  BP: (!) 125/99 (!) 125/99  Pulse: 83 81  Resp: 16 13  Temp: 36.9 C   SpO2: 95% 95%    Last Pain:  Vitals:   12/13/21 1228  TempSrc:   PainSc: 4                  Nazario Russom S

## 2021-12-13 NOTE — Op Note (Signed)
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 After reviewing the management options for treatment, he elected to proceed with the above surgical procedure(s). We have discussed the potential benefits and risks of the procedure, side effects of the proposed treatment, the likelihood of the patient achieving the goals of the procedure, and any potential problems that might occur during the procedure or recuperation. Informed consent has been obtained.  Description of procedure:  The patient was taken to the operating room and general anesthesia was induced.  The patient was placed in the dorsal lithotomy position, prepped and draped in the usual sterile fashion, and preoperative antibiotics were administered. A preoperative time-out was performed.   Cystourethroscopy was performed.  The patient's urethra was examined and was normal/ demonstrated bilobar prostatic hypertrophy. The bladder was then systematically examined in its entirety. There was no evidence of any bladder tumors, stones, or other mucosal pathology.  The residual prostate tissue was visualized. There was poorly healing fibrinous exudate on left lateral lobe and very small amount on right lateral lobe.  This are was then resected utilizing loop cautery resection with the bipolar cutting loop.  Care was taken not to resect distal to the  verumontanum.  There was not a significant amount of tissue resected or cauterization.  Hemostasis was then achieved with the cautery and the bladder was emptied and reinspected with no significant bleeding noted at the end of the procedure.  Hemostasis was adequate with irrigant turned off.  The resectoscope was removed.  The patient appeared to tolerate the procedure well and without complications.  The patient was able to be awakened and transferred to the recovery unit in satisfactory condition.   Plan: No catheter was left in place.

## 2021-12-13 NOTE — Transfer of Care (Signed)
Immediate Anesthesia Transfer of Care Note  Patient: Eric Rice  Procedure(s) Performed: REPEAT TRANSURETHRAL RESECTION OF THE PROSTATE (TURP)/URETHRAL DILATION (Prostate)  Patient Location: PACU  Anesthesia Type:General  Level of Consciousness: drowsy  Airway & Oxygen Therapy: Patient Spontanous Breathing and Patient connected to nasal cannula oxygen  Post-op Assessment: Report given to RN and Post -op Vital signs reviewed and stable  Post vital signs: Reviewed and stable  Last Vitals:  Vitals Value Taken Time  BP 101/70 12/13/21 1148  Temp    Pulse 72 12/13/21 1148  Resp 15 12/13/21 1148  SpO2 95 % 12/13/21 1148    Last Pain:  Vitals:   12/13/21 1012  TempSrc: Oral  PainSc: 4       Patients Stated Pain Goal: 4 (24/19/91 4445)  Complications: No notable events documented.

## 2021-12-13 NOTE — Progress Notes (Signed)
Actual time 1243.

## 2021-12-13 NOTE — Discharge Instructions (Addendum)
Post transurethral resection of the prostate (TURP) instructions ° °Your recent prostate surgery requires very special post hospital care. Despite the fact that no skin incisions were used the area around the prostate incision is quite raw and is covered with a scab to promote healing and prevent bleeding. Certain cautions are needed to assure that the scab is not disturbed of the next 2-3 weeks while the healing proceeds. ° °Because the raw surface in your prostate and the irritating effects of urine you may expect frequency of urination and/or urgency (a stronger desire to urinate) and perhaps even getting up at night more often. This will usually resolve or improve slowly over the healing period. You may see some blood in your urine over the first 6 weeks. Do not be alarmed, even if the urine was clear for a while. Get off your feet and drink lots of fluids until clearing occurs. If you start to pass clots or don't improve call us. ° °Catheter: (If you are discharged with a catheter.) °1. Keep your catheter secured to your leg at all times with tape or the supplied strap. °2. You may experience leakage of urine around your catheter- as long as the  °catheter continues to drain, this is normal.  If your catheter stops draining  °go to the ER. °3. You may also have blood in your urine, even after it has been clear for  °several days; you may even pass some small blood clots or other material.  This  °is normal as well.  If this happens, sit down and drink plenty of water to help  °make urine to flush out your bladder.  If the blood in your urine becomes worse  °after doing this, contact our office or return to the ER. °4. You may use the leg bag (small bag) during the day, but use the large bag at  °night. ° °Diet: ° °You may return to your normal diet immediately. Because of the raw surface of your bladder, alcohol, spicy foods, foods high in acid and drinks with caffeine may cause irritation or frequency and  should be used in moderation. To keep your urine flowing freely and avoid constipation, drink plenty of fluids during the day (8-10 glasses). Tip: Avoid cranberry juice because it is very acidic. ° °Activity: ° °Your physical activity doesn't need to be restricted. However, if you are very active, you may see some blood in the urine. We suggest that you reduce your activity under the circumstances until the bleeding has stopped. ° °Bowels: ° °It is important to keep your bowels regular during the postoperative period. Straining with bowel movements can cause bleeding. A bowel movement every other day is reasonable. Use a mild laxative if needed, such as milk of magnesia 2-3 tablespoons, or 2 Dulcolax tablets. Call if you continue to have problems. If you had been taking narcotics for pain, before, during or after your surgery, you may be constipated. Take a laxative if necessary. ° °Medication: ° °You should resume your pre-surgery medications unless told not to. DO NOT RESUME YOUR ASPIRIN, WARFARIN, OR OTHER BLOOD THINNER FOR 1 WEEK. In addition you may be given an antibiotic to prevent or treat infection. Antibiotics are not always necessary. All medication should be taken as prescribed until the bottles are finished unless you are having an unusual reaction to one of the drugs. ° ° ° ° °Problems you should report to us: ° °a. Fever greater than 101°F. °b. Heavy bleeding, or clots (see   notes above about blood in urine). c. Inability to urinate. d. Drug reactions (hives, rash, nausea, vomiting, diarrhea). e. Severe burning or pain with urination that is not improving.   Post Anesthesia Home Care Instructions  Activity: Get plenty of rest for the remainder of the day. A responsible individual must stay with you for 24 hours following the procedure.  For the next 24 hours, DO NOT: -Drive a car -Paediatric nurse -Drink alcoholic beverages -Take any medication unless instructed by your physician -Make  any legal decisions or sign important papers.  Meals: Start with liquid foods such as gelatin or soup. Progress to regular foods as tolerated. Avoid greasy, spicy, heavy foods. If nausea and/or vomiting occur, drink only clear liquids until the nausea and/or vomiting subsides. Call your physician if vomiting continues.  Special Instructions/Symptoms: Your throat may feel dry or sore from the anesthesia or the breathing tube placed in your throat during surgery. If this causes discomfort, gargle with warm salt water. The discomfort should disappear within 24 hours.

## 2021-12-14 ENCOUNTER — Encounter (HOSPITAL_BASED_OUTPATIENT_CLINIC_OR_DEPARTMENT_OTHER): Payer: Self-pay | Admitting: Urology

## 2021-12-14 LAB — SURGICAL PATHOLOGY

## 2021-12-20 DIAGNOSIS — M13841 Other specified arthritis, right hand: Secondary | ICD-10-CM | POA: Diagnosis not present

## 2021-12-20 DIAGNOSIS — M25811 Other specified joint disorders, right shoulder: Secondary | ICD-10-CM | POA: Diagnosis not present

## 2021-12-28 DIAGNOSIS — R8271 Bacteriuria: Secondary | ICD-10-CM | POA: Diagnosis not present

## 2021-12-28 DIAGNOSIS — N3000 Acute cystitis without hematuria: Secondary | ICD-10-CM | POA: Diagnosis not present

## 2022-01-06 DIAGNOSIS — R8271 Bacteriuria: Secondary | ICD-10-CM | POA: Diagnosis not present

## 2022-01-06 DIAGNOSIS — R3914 Feeling of incomplete bladder emptying: Secondary | ICD-10-CM | POA: Diagnosis not present

## 2022-01-06 DIAGNOSIS — R300 Dysuria: Secondary | ICD-10-CM | POA: Diagnosis not present

## 2022-01-17 DIAGNOSIS — U071 COVID-19: Secondary | ICD-10-CM | POA: Diagnosis not present

## 2022-02-07 DIAGNOSIS — L219 Seborrheic dermatitis, unspecified: Secondary | ICD-10-CM | POA: Diagnosis not present

## 2022-02-07 DIAGNOSIS — C44519 Basal cell carcinoma of skin of other part of trunk: Secondary | ICD-10-CM | POA: Diagnosis not present

## 2022-02-07 DIAGNOSIS — L738 Other specified follicular disorders: Secondary | ICD-10-CM | POA: Diagnosis not present

## 2022-02-07 DIAGNOSIS — Z8589 Personal history of malignant neoplasm of other organs and systems: Secondary | ICD-10-CM | POA: Diagnosis not present

## 2022-02-07 DIAGNOSIS — L814 Other melanin hyperpigmentation: Secondary | ICD-10-CM | POA: Diagnosis not present

## 2022-02-07 DIAGNOSIS — L578 Other skin changes due to chronic exposure to nonionizing radiation: Secondary | ICD-10-CM | POA: Diagnosis not present

## 2022-02-07 DIAGNOSIS — L821 Other seborrheic keratosis: Secondary | ICD-10-CM | POA: Diagnosis not present

## 2022-02-07 DIAGNOSIS — D485 Neoplasm of uncertain behavior of skin: Secondary | ICD-10-CM | POA: Diagnosis not present

## 2022-02-07 DIAGNOSIS — D1801 Hemangioma of skin and subcutaneous tissue: Secondary | ICD-10-CM | POA: Diagnosis not present

## 2022-02-07 DIAGNOSIS — D225 Melanocytic nevi of trunk: Secondary | ICD-10-CM | POA: Diagnosis not present

## 2022-02-07 DIAGNOSIS — C44319 Basal cell carcinoma of skin of other parts of face: Secondary | ICD-10-CM | POA: Diagnosis not present

## 2022-02-07 DIAGNOSIS — L820 Inflamed seborrheic keratosis: Secondary | ICD-10-CM | POA: Diagnosis not present

## 2022-02-07 DIAGNOSIS — L570 Actinic keratosis: Secondary | ICD-10-CM | POA: Diagnosis not present

## 2022-02-09 DIAGNOSIS — R42 Dizziness and giddiness: Secondary | ICD-10-CM | POA: Diagnosis not present

## 2022-02-09 DIAGNOSIS — H9191 Unspecified hearing loss, right ear: Secondary | ICD-10-CM | POA: Diagnosis not present

## 2022-02-17 DIAGNOSIS — R300 Dysuria: Secondary | ICD-10-CM | POA: Diagnosis not present

## 2022-02-17 DIAGNOSIS — R8271 Bacteriuria: Secondary | ICD-10-CM | POA: Diagnosis not present

## 2022-03-17 DIAGNOSIS — M5412 Radiculopathy, cervical region: Secondary | ICD-10-CM | POA: Diagnosis not present

## 2022-03-17 DIAGNOSIS — R0982 Postnasal drip: Secondary | ICD-10-CM | POA: Diagnosis not present

## 2022-03-17 DIAGNOSIS — M542 Cervicalgia: Secondary | ICD-10-CM | POA: Diagnosis not present

## 2022-03-17 DIAGNOSIS — R42 Dizziness and giddiness: Secondary | ICD-10-CM | POA: Diagnosis not present

## 2022-03-22 DIAGNOSIS — C44519 Basal cell carcinoma of skin of other part of trunk: Secondary | ICD-10-CM | POA: Diagnosis not present

## 2022-03-22 DIAGNOSIS — M5020 Other cervical disc displacement, unspecified cervical region: Secondary | ICD-10-CM | POA: Diagnosis not present

## 2022-03-22 DIAGNOSIS — Z6830 Body mass index (BMI) 30.0-30.9, adult: Secondary | ICD-10-CM | POA: Diagnosis not present

## 2022-03-31 DIAGNOSIS — R3915 Urgency of urination: Secondary | ICD-10-CM | POA: Diagnosis not present

## 2022-03-31 DIAGNOSIS — R3914 Feeling of incomplete bladder emptying: Secondary | ICD-10-CM | POA: Diagnosis not present

## 2022-03-31 DIAGNOSIS — R300 Dysuria: Secondary | ICD-10-CM | POA: Diagnosis not present

## 2022-03-31 DIAGNOSIS — N401 Enlarged prostate with lower urinary tract symptoms: Secondary | ICD-10-CM | POA: Diagnosis not present

## 2022-03-31 DIAGNOSIS — R350 Frequency of micturition: Secondary | ICD-10-CM | POA: Diagnosis not present

## 2022-04-04 DIAGNOSIS — M4802 Spinal stenosis, cervical region: Secondary | ICD-10-CM | POA: Diagnosis not present

## 2022-04-04 DIAGNOSIS — M5020 Other cervical disc displacement, unspecified cervical region: Secondary | ICD-10-CM | POA: Diagnosis not present

## 2022-04-04 DIAGNOSIS — M542 Cervicalgia: Secondary | ICD-10-CM | POA: Diagnosis not present

## 2022-04-10 DIAGNOSIS — Z6830 Body mass index (BMI) 30.0-30.9, adult: Secondary | ICD-10-CM | POA: Diagnosis not present

## 2022-04-10 DIAGNOSIS — M5020 Other cervical disc displacement, unspecified cervical region: Secondary | ICD-10-CM | POA: Diagnosis not present

## 2022-04-11 DIAGNOSIS — M13841 Other specified arthritis, right hand: Secondary | ICD-10-CM | POA: Diagnosis not present

## 2022-04-11 DIAGNOSIS — M25811 Other specified joint disorders, right shoulder: Secondary | ICD-10-CM | POA: Diagnosis not present

## 2022-04-12 DIAGNOSIS — R102 Pelvic and perineal pain: Secondary | ICD-10-CM | POA: Diagnosis not present

## 2022-04-12 DIAGNOSIS — R351 Nocturia: Secondary | ICD-10-CM | POA: Diagnosis not present

## 2022-04-12 DIAGNOSIS — M6281 Muscle weakness (generalized): Secondary | ICD-10-CM | POA: Diagnosis not present

## 2022-04-12 DIAGNOSIS — M62838 Other muscle spasm: Secondary | ICD-10-CM | POA: Diagnosis not present

## 2022-04-12 DIAGNOSIS — N3943 Post-void dribbling: Secondary | ICD-10-CM | POA: Diagnosis not present

## 2022-04-12 DIAGNOSIS — R278 Other lack of coordination: Secondary | ICD-10-CM | POA: Diagnosis not present

## 2022-04-26 DIAGNOSIS — C44519 Basal cell carcinoma of skin of other part of trunk: Secondary | ICD-10-CM | POA: Diagnosis not present

## 2022-05-05 DIAGNOSIS — M542 Cervicalgia: Secondary | ICD-10-CM | POA: Diagnosis not present

## 2022-05-05 DIAGNOSIS — M5412 Radiculopathy, cervical region: Secondary | ICD-10-CM | POA: Diagnosis not present

## 2022-05-09 ENCOUNTER — Institutional Professional Consult (permissible substitution): Payer: Medicare Other | Admitting: Neurology

## 2022-05-18 DIAGNOSIS — C44319 Basal cell carcinoma of skin of other parts of face: Secondary | ICD-10-CM | POA: Diagnosis not present

## 2022-05-18 DIAGNOSIS — L810 Postinflammatory hyperpigmentation: Secondary | ICD-10-CM | POA: Diagnosis not present

## 2022-05-23 DIAGNOSIS — E291 Testicular hypofunction: Secondary | ICD-10-CM | POA: Diagnosis not present

## 2022-05-23 DIAGNOSIS — F4310 Post-traumatic stress disorder, unspecified: Secondary | ICD-10-CM | POA: Diagnosis not present

## 2022-05-23 DIAGNOSIS — M797 Fibromyalgia: Secondary | ICD-10-CM | POA: Diagnosis not present

## 2022-05-23 DIAGNOSIS — F411 Generalized anxiety disorder: Secondary | ICD-10-CM | POA: Diagnosis not present

## 2022-05-23 DIAGNOSIS — R7303 Prediabetes: Secondary | ICD-10-CM | POA: Diagnosis not present

## 2022-05-23 DIAGNOSIS — N400 Enlarged prostate without lower urinary tract symptoms: Secondary | ICD-10-CM | POA: Diagnosis not present

## 2022-05-23 DIAGNOSIS — C8590 Non-Hodgkin lymphoma, unspecified, unspecified site: Secondary | ICD-10-CM | POA: Diagnosis not present

## 2022-05-23 DIAGNOSIS — R42 Dizziness and giddiness: Secondary | ICD-10-CM | POA: Diagnosis not present

## 2022-05-23 DIAGNOSIS — Z0000 Encounter for general adult medical examination without abnormal findings: Secondary | ICD-10-CM | POA: Diagnosis not present

## 2022-05-23 DIAGNOSIS — E7800 Pure hypercholesterolemia, unspecified: Secondary | ICD-10-CM | POA: Diagnosis not present

## 2022-05-23 DIAGNOSIS — Z1331 Encounter for screening for depression: Secondary | ICD-10-CM | POA: Diagnosis not present

## 2022-05-23 DIAGNOSIS — F3341 Major depressive disorder, recurrent, in partial remission: Secondary | ICD-10-CM | POA: Diagnosis not present

## 2022-05-24 ENCOUNTER — Other Ambulatory Visit: Payer: Self-pay | Admitting: Family Medicine

## 2022-05-24 DIAGNOSIS — M62838 Other muscle spasm: Secondary | ICD-10-CM | POA: Diagnosis not present

## 2022-05-24 DIAGNOSIS — R102 Pelvic and perineal pain: Secondary | ICD-10-CM | POA: Diagnosis not present

## 2022-05-24 DIAGNOSIS — R278 Other lack of coordination: Secondary | ICD-10-CM | POA: Diagnosis not present

## 2022-05-24 DIAGNOSIS — R350 Frequency of micturition: Secondary | ICD-10-CM | POA: Diagnosis not present

## 2022-05-24 DIAGNOSIS — R42 Dizziness and giddiness: Secondary | ICD-10-CM

## 2022-05-24 DIAGNOSIS — M6281 Muscle weakness (generalized): Secondary | ICD-10-CM | POA: Diagnosis not present

## 2022-05-28 ENCOUNTER — Ambulatory Visit
Admission: RE | Admit: 2022-05-28 | Discharge: 2022-05-28 | Disposition: A | Discharge status: Home / Self Care | Payer: Medicare Other | Source: Ambulatory Visit | Attending: Family Medicine | Admitting: Family Medicine

## 2022-05-28 DIAGNOSIS — R42 Dizziness and giddiness: Secondary | ICD-10-CM | POA: Diagnosis not present

## 2022-05-28 DIAGNOSIS — R519 Headache, unspecified: Secondary | ICD-10-CM | POA: Diagnosis not present

## 2022-05-29 DIAGNOSIS — M4802 Spinal stenosis, cervical region: Secondary | ICD-10-CM | POA: Diagnosis not present

## 2022-05-29 DIAGNOSIS — M47812 Spondylosis without myelopathy or radiculopathy, cervical region: Secondary | ICD-10-CM | POA: Diagnosis not present

## 2022-05-29 DIAGNOSIS — M5030 Other cervical disc degeneration, unspecified cervical region: Secondary | ICD-10-CM | POA: Diagnosis not present

## 2022-06-14 ENCOUNTER — Telehealth: Payer: Self-pay | Admitting: Neurology

## 2022-06-14 NOTE — Telephone Encounter (Signed)
Error

## 2022-06-20 ENCOUNTER — Ambulatory Visit (INDEPENDENT_AMBULATORY_CARE_PROVIDER_SITE_OTHER): Payer: Medicare Other | Admitting: Neurology

## 2022-06-20 ENCOUNTER — Encounter: Payer: Self-pay | Admitting: Neurology

## 2022-06-20 VITALS — BP 114/82 | HR 94 | Ht 68.0 in | Wt 198.0 lb

## 2022-06-20 DIAGNOSIS — I679 Cerebrovascular disease, unspecified: Secondary | ICD-10-CM | POA: Insufficient documentation

## 2022-06-20 DIAGNOSIS — M47812 Spondylosis without myelopathy or radiculopathy, cervical region: Secondary | ICD-10-CM | POA: Diagnosis not present

## 2022-06-20 NOTE — Progress Notes (Signed)
Chief Complaint  Patient presents with   New Patient (Initial Visit)    Rm 15. Accompanied by wife. Carole Civil LV 2022/[employer]/dizziness. Dizziness began approx 4 months ago. No aggravating or alleviating factors. Prior hx of head injury.      ASSESSMENT AND PLAN  Eric Rice is a 77 y.o. male   Cerebral small vessel disease,  Related to his age, sedentary lifestyle, hyperlipidemia,   Continue aspirin 81 mg daily  I see no contraindication for transcranial magnetic field treatment for depression  Cervical spondylosis  MRI of cervical spine in April 2022, at C4-C5 a right eccentric posterior disc osteophyte complex results in severe right canal stenosis with compression of the cord. Moderate to severe right foraminal stenosis at this level. at C6-C7 there is moderate canal and bilateral foraminal stenosis.Mild canal stenosis at C3-C4 and C5-C6 with disc flattening the cord at these levels. Mild left foraminal stenosis at C2-C3, C3-C4, and C5-C6.  DIAGNOSTIC DATA (LABS, IMAGING, TESTING) - I reviewed patient records, labs, notes, testing and imaging myself where available.   MEDICAL HISTORY:  Eric Rice is a 78 year old male, seen in request by her primary care PA Horton Marshall for evaluation of depression, abnormal MRI of the brain,  I reviewed and summarized the referring note. PMHX. PTSD, depression, magnetic stimulation,  History of PE in 2013, Non-Hodgkin Lymphoma  I saw him in 2022 for cervical pain, cervical spondylitic disease, MRI of cervical spine on June 09, 2020 showed, multilevel degenerative changes, at C4-5, right eccentric posterior disc osteophyte complex, resulting severe right canal stenosis with compression of the cord, moderate to severe right foraminal stenosis; C6-7, moderate canal, bilateral foraminal stenosis  He was seen by different neurosurgeon, decided conservative treatment now, no gait abnormality, no bowel or  bladder incontinence  Today his main question is whether he is a good candidate for transcranial magnetic stimulation for depression, he reported lifelong history of depression since he came back from [place], has tried many different antidepression medications, currently taking Paxil 40 mg daily, still complains of depression, lack of motivations,  Personally reviewed MRI of the brain in April 2024, mild small vessel disease, right cerebellar hemisphere, there was no acute abnormality.   PHYSICAL EXAM:   Vitals:   06/20/22 0819 06/20/22 0820  BP: 103/68 114/82  Pulse: 97 94  Weight: 198 lb (89.8 kg)   Height: 5\' 8"  (1.727 m)    Body mass index is 30.11 kg/m.  PHYSICAL EXAMNIATION:  Gen: NAD, conversant, well nourised, well groomed                     Cardiovascular: Regular rate rhythm, no peripheral edema, warm, nontender. Eyes: Conjunctivae clear without exudates or hemorrhage Neck: Supple, no carotid bruits. Pulmonary: Clear to auscultation bilaterally   NEUROLOGICAL EXAM:  MENTAL STATUS: Speech/cognition: Awake, alert, oriented to history taking and casual conversation CRANIAL NERVES: CN II: Visual fields are full to confrontation. Pupils are round equal and briskly reactive to light. CN III, IV, VI: extraocular movement are normal. No ptosis. CN V: Facial sensation is intact to light touch CN VII: Face is symmetric with normal eye closure  CN VIII: Hearing is normal to causal conversation. CN IX, X: Phonation is normal. CN XI: Head turning and shoulder shrug are intact  MOTOR: There is no pronator drift of out-stretched arms. Muscle bulk and tone are normal. Muscle strength is normal.  REFLEXES: Reflexes are 2+ and symmetric  at the biceps, triceps, knees, and ankles. Plantar responses are flexor.  SENSORY: Intact to light touch, pinprick and vibratory sensation are intact in fingers and toes.  COORDINATION: There is no trunk or limb dysmetria  noted.  GAIT/STANCE: Posture is normal. Gait is steady with normal steps, base, arm swing, and turning. Heel and toe walking are normal. Tandem gait is normal.  Romberg is absent.  REVIEW OF SYSTEMS:  Full 14 system review of systems performed and notable only for as above All other review of systems were negative.   ALLERGIES: No Known Allergies  HOME MEDICATIONS: Current Outpatient Medications  Medication Sig Dispense Refill   aspirin 81 MG tablet Take 81 mg by mouth at bedtime.      Calcium Carb-Cholecalciferol (CALCIUM 600-D) 600-10 MG-MCG TABS Take 1 tablet by mouth daily.     Cholecalciferol (D3 2000) 50 MCG (2000 UT) CAPS Take 1 capsule by mouth daily.     Multiple Vitamins-Minerals (CENTRUM SILVER PO) Take 1 tablet by mouth daily.     PARoxetine (PAXIL) 40 MG tablet Take 20 mg by mouth every morning.     simvastatin (ZOCOR) 80 MG tablet Take 40 mg by mouth at bedtime.     Testosterone 20.25 MG/1.25GM (1.62%) GEL 3 pumps to skin every day Please dispense quantity needed for one month (Patient taking differently: 3 pumps to skin every day Please dispense quantity needed for one month) 1.25 g 3   No current facility-administered medications for this visit.    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, treatment active survillance every 6 months   History of chest pain    neg adenosine myoview in 2008   History of pulmonary embolus (PE) 11/2011   bilateral lower lobes and right upper lobe,   provoked long car travel,  completed 6 months xarelto  (12-06-2021  pt stated never had clots before or since 2013   History of skin cancer 03/04/2013   Middle Of Nose Atypical Trichilemmoma    Hyperlipidemia    Impingement of right shoulder    Primary hypogonadism in male    PTSD (post-traumatic stress disorder)     PAST SURGICAL HISTORY: Past Surgical History:  Procedure Laterality Date   RHINOPLASTY     x2   last one 1970s   TONSILLECTOMY     child   TRANSURETHRAL RESECTION OF PROSTATE N/A 09/13/2021   Procedure: TRANSURETHRAL RESECTION OF THE PROSTATE (TURP);  Surgeon: Noel Christmas, MD;  Location: [facility];  Service: Urology;  Laterality: N/A;   TRANSURETHRAL RESECTION OF PROSTATE N/A 12/13/2021   Procedure: REPEAT TRANSURETHRAL RESECTION OF THE PROSTATE (TURP)/URETHRAL DILATION;  Surgeon: Noel Christmas, MD;  Location: [facility];  Service: Urology;  Laterality: N/A;  45 MINS    FAMILY HISTORY: Family History  Problem Relation Age of Onset   Heart disease Brother 38       MI   Heart disease Mother  Heart disease Father     SOCIAL HISTORY: Social History   Socioeconomic History   Marital status: Married    Spouse name: Not on file   Number of children: Not on file   Years of education: Not on file   Highest education level: Not on file  Occupational History   Not on file  Tobacco Use   Smoking status: Never   Smokeless tobacco: Never  Substance and Sexual Activity   Alcohol use: No   Drug use: No   Sexual activity: Yes  Other Topics Concern   Not on file  Social History Narrative   Retired from [employer].   Married to [place], no kids- one step son.   Social Determinants of Health   Financial Resource Strain: Not on file  Food Insecurity: Not on file  Transportation Needs: Not on file  Physical Activity: Not on file  Stress: Not on file  Social Connections: Not on file  Intimate Partner Violence: Not on file       Levert Feinstein, M.D. Ph.D.  [facility] [address], [address] [place], [place] [address] Ph: [phone] Fax: [phone]  CC:  Wilfrid Lund, [place] [address] [place],  [place] [address]  Wilfrid Lund, [place]

## 2022-06-28 DIAGNOSIS — F332 Major depressive disorder, recurrent severe without psychotic features: Secondary | ICD-10-CM | POA: Diagnosis not present

## 2022-06-30 DIAGNOSIS — R102 Pelvic and perineal pain: Secondary | ICD-10-CM | POA: Diagnosis not present

## 2022-06-30 DIAGNOSIS — N401 Enlarged prostate with lower urinary tract symptoms: Secondary | ICD-10-CM | POA: Diagnosis not present

## 2022-06-30 DIAGNOSIS — R351 Nocturia: Secondary | ICD-10-CM | POA: Diagnosis not present

## 2022-07-11 DIAGNOSIS — N3943 Post-void dribbling: Secondary | ICD-10-CM | POA: Diagnosis not present

## 2022-07-11 DIAGNOSIS — R3914 Feeling of incomplete bladder emptying: Secondary | ICD-10-CM | POA: Diagnosis not present

## 2022-07-11 DIAGNOSIS — R102 Pelvic and perineal pain: Secondary | ICD-10-CM | POA: Diagnosis not present

## 2022-07-11 DIAGNOSIS — M62838 Other muscle spasm: Secondary | ICD-10-CM | POA: Diagnosis not present

## 2022-07-11 DIAGNOSIS — M6281 Muscle weakness (generalized): Secondary | ICD-10-CM | POA: Diagnosis not present

## 2022-07-13 DIAGNOSIS — F332 Major depressive disorder, recurrent severe without psychotic features: Secondary | ICD-10-CM | POA: Diagnosis not present

## 2022-07-14 DIAGNOSIS — F332 Major depressive disorder, recurrent severe without psychotic features: Secondary | ICD-10-CM | POA: Diagnosis not present

## 2022-07-18 DIAGNOSIS — F332 Major depressive disorder, recurrent severe without psychotic features: Secondary | ICD-10-CM | POA: Diagnosis not present

## 2022-07-19 DIAGNOSIS — F332 Major depressive disorder, recurrent severe without psychotic features: Secondary | ICD-10-CM | POA: Diagnosis not present

## 2022-07-20 DIAGNOSIS — F332 Major depressive disorder, recurrent severe without psychotic features: Secondary | ICD-10-CM | POA: Diagnosis not present

## 2022-07-21 DIAGNOSIS — F332 Major depressive disorder, recurrent severe without psychotic features: Secondary | ICD-10-CM | POA: Diagnosis not present

## 2022-07-24 DIAGNOSIS — F332 Major depressive disorder, recurrent severe without psychotic features: Secondary | ICD-10-CM | POA: Diagnosis not present

## 2022-07-25 DIAGNOSIS — F332 Major depressive disorder, recurrent severe without psychotic features: Secondary | ICD-10-CM | POA: Diagnosis not present

## 2022-07-26 DIAGNOSIS — F332 Major depressive disorder, recurrent severe without psychotic features: Secondary | ICD-10-CM | POA: Diagnosis not present

## 2022-07-27 DIAGNOSIS — F332 Major depressive disorder, recurrent severe without psychotic features: Secondary | ICD-10-CM | POA: Diagnosis not present

## 2022-07-28 DIAGNOSIS — F332 Major depressive disorder, recurrent severe without psychotic features: Secondary | ICD-10-CM | POA: Diagnosis not present

## 2022-07-31 DIAGNOSIS — F332 Major depressive disorder, recurrent severe without psychotic features: Secondary | ICD-10-CM | POA: Diagnosis not present

## 2022-07-31 DIAGNOSIS — M7541 Impingement syndrome of right shoulder: Secondary | ICD-10-CM | POA: Diagnosis not present

## 2022-07-31 DIAGNOSIS — M25811 Other specified joint disorders, right shoulder: Secondary | ICD-10-CM | POA: Diagnosis not present

## 2022-07-31 DIAGNOSIS — M13841 Other specified arthritis, right hand: Secondary | ICD-10-CM | POA: Diagnosis not present

## 2022-08-01 DIAGNOSIS — F332 Major depressive disorder, recurrent severe without psychotic features: Secondary | ICD-10-CM | POA: Diagnosis not present

## 2022-08-02 DIAGNOSIS — F332 Major depressive disorder, recurrent severe without psychotic features: Secondary | ICD-10-CM | POA: Diagnosis not present

## 2022-08-03 DIAGNOSIS — F332 Major depressive disorder, recurrent severe without psychotic features: Secondary | ICD-10-CM | POA: Diagnosis not present

## 2022-08-04 DIAGNOSIS — F332 Major depressive disorder, recurrent severe without psychotic features: Secondary | ICD-10-CM | POA: Diagnosis not present

## 2022-08-07 DIAGNOSIS — F332 Major depressive disorder, recurrent severe without psychotic features: Secondary | ICD-10-CM | POA: Diagnosis not present

## 2022-08-08 DIAGNOSIS — F332 Major depressive disorder, recurrent severe without psychotic features: Secondary | ICD-10-CM | POA: Diagnosis not present

## 2022-08-09 DIAGNOSIS — Z8589 Personal history of malignant neoplasm of other organs and systems: Secondary | ICD-10-CM | POA: Diagnosis not present

## 2022-08-09 DIAGNOSIS — L820 Inflamed seborrheic keratosis: Secondary | ICD-10-CM | POA: Diagnosis not present

## 2022-08-09 DIAGNOSIS — L814 Other melanin hyperpigmentation: Secondary | ICD-10-CM | POA: Diagnosis not present

## 2022-08-09 DIAGNOSIS — D485 Neoplasm of uncertain behavior of skin: Secondary | ICD-10-CM | POA: Diagnosis not present

## 2022-08-09 DIAGNOSIS — Z85828 Personal history of other malignant neoplasm of skin: Secondary | ICD-10-CM | POA: Diagnosis not present

## 2022-08-09 DIAGNOSIS — D225 Melanocytic nevi of trunk: Secondary | ICD-10-CM | POA: Diagnosis not present

## 2022-08-09 DIAGNOSIS — L738 Other specified follicular disorders: Secondary | ICD-10-CM | POA: Diagnosis not present

## 2022-08-09 DIAGNOSIS — L821 Other seborrheic keratosis: Secondary | ICD-10-CM | POA: Diagnosis not present

## 2022-08-09 DIAGNOSIS — D1801 Hemangioma of skin and subcutaneous tissue: Secondary | ICD-10-CM | POA: Diagnosis not present

## 2022-08-09 DIAGNOSIS — L578 Other skin changes due to chronic exposure to nonionizing radiation: Secondary | ICD-10-CM | POA: Diagnosis not present

## 2022-08-10 DIAGNOSIS — F332 Major depressive disorder, recurrent severe without psychotic features: Secondary | ICD-10-CM | POA: Diagnosis not present

## 2022-08-11 DIAGNOSIS — F332 Major depressive disorder, recurrent severe without psychotic features: Secondary | ICD-10-CM | POA: Diagnosis not present

## 2022-08-14 DIAGNOSIS — F332 Major depressive disorder, recurrent severe without psychotic features: Secondary | ICD-10-CM | POA: Diagnosis not present

## 2022-08-15 DIAGNOSIS — F332 Major depressive disorder, recurrent severe without psychotic features: Secondary | ICD-10-CM | POA: Diagnosis not present

## 2022-08-16 DIAGNOSIS — F332 Major depressive disorder, recurrent severe without psychotic features: Secondary | ICD-10-CM | POA: Diagnosis not present

## 2022-08-17 DIAGNOSIS — M792 Neuralgia and neuritis, unspecified: Secondary | ICD-10-CM | POA: Diagnosis not present

## 2022-08-17 DIAGNOSIS — F332 Major depressive disorder, recurrent severe without psychotic features: Secondary | ICD-10-CM | POA: Diagnosis not present

## 2022-08-17 DIAGNOSIS — R1031 Right lower quadrant pain: Secondary | ICD-10-CM | POA: Diagnosis not present

## 2022-08-18 DIAGNOSIS — F332 Major depressive disorder, recurrent severe without psychotic features: Secondary | ICD-10-CM | POA: Diagnosis not present

## 2022-08-21 DIAGNOSIS — F332 Major depressive disorder, recurrent severe without psychotic features: Secondary | ICD-10-CM | POA: Diagnosis not present

## 2022-08-22 DIAGNOSIS — F332 Major depressive disorder, recurrent severe without psychotic features: Secondary | ICD-10-CM | POA: Diagnosis not present

## 2022-08-23 DIAGNOSIS — F332 Major depressive disorder, recurrent severe without psychotic features: Secondary | ICD-10-CM | POA: Diagnosis not present

## 2022-08-25 DIAGNOSIS — F332 Major depressive disorder, recurrent severe without psychotic features: Secondary | ICD-10-CM | POA: Diagnosis not present

## 2022-08-28 DIAGNOSIS — F332 Major depressive disorder, recurrent severe without psychotic features: Secondary | ICD-10-CM | POA: Diagnosis not present

## 2022-08-29 DIAGNOSIS — F332 Major depressive disorder, recurrent severe without psychotic features: Secondary | ICD-10-CM | POA: Diagnosis not present

## 2022-08-30 DIAGNOSIS — F332 Major depressive disorder, recurrent severe without psychotic features: Secondary | ICD-10-CM | POA: Diagnosis not present

## 2022-08-31 DIAGNOSIS — F332 Major depressive disorder, recurrent severe without psychotic features: Secondary | ICD-10-CM | POA: Diagnosis not present

## 2022-09-11 ENCOUNTER — Institutional Professional Consult (permissible substitution): Payer: Medicare Other | Admitting: Neurology

## 2022-09-19 DIAGNOSIS — Z79899 Other long term (current) drug therapy: Secondary | ICD-10-CM | POA: Diagnosis not present

## 2022-09-19 DIAGNOSIS — E785 Hyperlipidemia, unspecified: Secondary | ICD-10-CM | POA: Diagnosis not present

## 2022-09-19 DIAGNOSIS — R399 Unspecified symptoms and signs involving the genitourinary system: Secondary | ICD-10-CM | POA: Diagnosis not present

## 2022-09-19 DIAGNOSIS — R3916 Straining to void: Secondary | ICD-10-CM | POA: Diagnosis not present

## 2022-09-19 DIAGNOSIS — M797 Fibromyalgia: Secondary | ICD-10-CM | POA: Diagnosis not present

## 2022-09-19 DIAGNOSIS — N401 Enlarged prostate with lower urinary tract symptoms: Secondary | ICD-10-CM | POA: Diagnosis not present

## 2022-09-19 DIAGNOSIS — Z7982 Long term (current) use of aspirin: Secondary | ICD-10-CM | POA: Diagnosis not present

## 2022-09-19 DIAGNOSIS — R102 Pelvic and perineal pain: Secondary | ICD-10-CM | POA: Diagnosis not present

## 2022-10-11 DIAGNOSIS — F332 Major depressive disorder, recurrent severe without psychotic features: Secondary | ICD-10-CM | POA: Diagnosis not present

## 2022-10-12 DIAGNOSIS — R102 Pelvic and perineal pain: Secondary | ICD-10-CM | POA: Diagnosis not present

## 2022-10-12 DIAGNOSIS — R1031 Right lower quadrant pain: Secondary | ICD-10-CM | POA: Diagnosis not present

## 2022-10-12 DIAGNOSIS — M6208 Separation of muscle (nontraumatic), other site: Secondary | ICD-10-CM | POA: Diagnosis not present

## 2022-10-18 ENCOUNTER — Other Ambulatory Visit: Payer: Self-pay | Admitting: Family Medicine

## 2022-10-18 ENCOUNTER — Encounter: Payer: Self-pay | Admitting: Family Medicine

## 2022-10-18 DIAGNOSIS — R1031 Right lower quadrant pain: Secondary | ICD-10-CM

## 2022-10-19 ENCOUNTER — Ambulatory Visit
Admission: RE | Admit: 2022-10-19 | Discharge: 2022-10-19 | Disposition: A | Discharge status: Home / Self Care | Payer: Medicare Other | Source: Ambulatory Visit | Attending: Family Medicine | Admitting: Family Medicine

## 2022-10-19 DIAGNOSIS — R1031 Right lower quadrant pain: Secondary | ICD-10-CM | POA: Diagnosis not present

## 2022-10-25 ENCOUNTER — Other Ambulatory Visit: Payer: Self-pay | Admitting: Family Medicine

## 2022-10-25 DIAGNOSIS — R1031 Right lower quadrant pain: Secondary | ICD-10-CM

## 2022-10-25 DIAGNOSIS — N5082 Scrotal pain: Secondary | ICD-10-CM

## 2022-10-30 ENCOUNTER — Ambulatory Visit
Admission: RE | Admit: 2022-10-30 | Discharge: 2022-10-30 | Disposition: A | Discharge status: Home / Self Care | Payer: Medicare Other | Source: Ambulatory Visit | Attending: Family Medicine | Admitting: Family Medicine

## 2022-10-30 DIAGNOSIS — N5082 Scrotal pain: Secondary | ICD-10-CM | POA: Diagnosis not present

## 2022-10-30 DIAGNOSIS — R1031 Right lower quadrant pain: Secondary | ICD-10-CM

## 2022-10-30 DIAGNOSIS — N503 Cyst of epididymis: Secondary | ICD-10-CM | POA: Diagnosis not present

## 2022-11-02 DIAGNOSIS — M13841 Other specified arthritis, right hand: Secondary | ICD-10-CM | POA: Diagnosis not present

## 2022-11-02 DIAGNOSIS — M25811 Other specified joint disorders, right shoulder: Secondary | ICD-10-CM | POA: Diagnosis not present

## 2023-01-25 DIAGNOSIS — M13841 Other specified arthritis, right hand: Secondary | ICD-10-CM | POA: Diagnosis not present

## 2023-01-25 DIAGNOSIS — M7541 Impingement syndrome of right shoulder: Secondary | ICD-10-CM | POA: Diagnosis not present

## 2023-01-30 DIAGNOSIS — L814 Other melanin hyperpigmentation: Secondary | ICD-10-CM | POA: Diagnosis not present

## 2023-01-30 DIAGNOSIS — L820 Inflamed seborrheic keratosis: Secondary | ICD-10-CM | POA: Diagnosis not present

## 2023-01-30 DIAGNOSIS — D225 Melanocytic nevi of trunk: Secondary | ICD-10-CM | POA: Diagnosis not present

## 2023-01-30 DIAGNOSIS — L738 Other specified follicular disorders: Secondary | ICD-10-CM | POA: Diagnosis not present

## 2023-01-30 DIAGNOSIS — Z85828 Personal history of other malignant neoplasm of skin: Secondary | ICD-10-CM | POA: Diagnosis not present

## 2023-01-30 DIAGNOSIS — Z8589 Personal history of malignant neoplasm of other organs and systems: Secondary | ICD-10-CM | POA: Diagnosis not present

## 2023-01-30 DIAGNOSIS — D485 Neoplasm of uncertain behavior of skin: Secondary | ICD-10-CM | POA: Diagnosis not present

## 2023-01-30 DIAGNOSIS — L821 Other seborrheic keratosis: Secondary | ICD-10-CM | POA: Diagnosis not present

## 2023-01-30 DIAGNOSIS — L578 Other skin changes due to chronic exposure to nonionizing radiation: Secondary | ICD-10-CM | POA: Diagnosis not present

## 2023-03-24 ENCOUNTER — Encounter (HOSPITAL_BASED_OUTPATIENT_CLINIC_OR_DEPARTMENT_OTHER): Payer: Self-pay

## 2023-03-24 ENCOUNTER — Emergency Department (HOSPITAL_BASED_OUTPATIENT_CLINIC_OR_DEPARTMENT_OTHER): Payer: Medicare Other

## 2023-03-24 ENCOUNTER — Other Ambulatory Visit: Payer: Self-pay

## 2023-03-24 ENCOUNTER — Emergency Department (HOSPITAL_BASED_OUTPATIENT_CLINIC_OR_DEPARTMENT_OTHER)
Admission: EM | Admit: 2023-03-24 | Discharge: 2023-03-24 | Disposition: A | Discharge status: Home / Self Care | Payer: Medicare Other | Attending: Emergency Medicine | Admitting: Emergency Medicine

## 2023-03-24 DIAGNOSIS — R0602 Shortness of breath: Secondary | ICD-10-CM | POA: Diagnosis not present

## 2023-03-24 DIAGNOSIS — R1013 Epigastric pain: Secondary | ICD-10-CM | POA: Diagnosis not present

## 2023-03-24 DIAGNOSIS — R000 Tachycardia, unspecified: Secondary | ICD-10-CM | POA: Diagnosis not present

## 2023-03-24 DIAGNOSIS — I2510 Atherosclerotic heart disease of native coronary artery without angina pectoris: Secondary | ICD-10-CM | POA: Diagnosis not present

## 2023-03-24 DIAGNOSIS — K859 Acute pancreatitis without necrosis or infection, unspecified: Secondary | ICD-10-CM | POA: Insufficient documentation

## 2023-03-24 DIAGNOSIS — Z7901 Long term (current) use of anticoagulants: Secondary | ICD-10-CM | POA: Diagnosis not present

## 2023-03-24 DIAGNOSIS — Z7982 Long term (current) use of aspirin: Secondary | ICD-10-CM | POA: Diagnosis not present

## 2023-03-24 DIAGNOSIS — R1031 Right lower quadrant pain: Secondary | ICD-10-CM | POA: Diagnosis not present

## 2023-03-24 LAB — CBC
HCT: 51.9 % (ref 39.0–52.0)
Hemoglobin: 17.5 g/dL — ABNORMAL HIGH (ref 13.0–17.0)
MCH: 29.7 pg (ref 26.0–34.0)
MCHC: 33.7 g/dL (ref 30.0–36.0)
MCV: 88.1 fL (ref 80.0–100.0)
Platelets: 150 10*3/uL (ref 150–400)
RBC: 5.89 MIL/uL — ABNORMAL HIGH (ref 4.22–5.81)
RDW: 14 % (ref 11.5–15.5)
WBC: 10.4 10*3/uL (ref 4.0–10.5)
nRBC: 0 % (ref 0.0–0.2)

## 2023-03-24 LAB — COMPREHENSIVE METABOLIC PANEL
ALT: 24 U/L (ref 0–44)
AST: 15 U/L (ref 15–41)
Albumin: 4.2 g/dL (ref 3.5–5.0)
Alkaline Phosphatase: 48 U/L (ref 38–126)
Anion gap: 11 (ref 5–15)
BUN: 16 mg/dL (ref 8–23)
CO2: 23 mmol/L (ref 22–32)
Calcium: 9.7 mg/dL (ref 8.9–10.3)
Chloride: 100 mmol/L (ref 98–111)
Creatinine, Ser: 1.09 mg/dL (ref 0.61–1.24)
GFR, Estimated: >60 mL/min (ref >60)
Glucose, Bld: 138 mg/dL — ABNORMAL HIGH (ref 70–99)
Potassium: 4.1 mmol/L (ref 3.5–5.1)
Sodium: 134 mmol/L — ABNORMAL LOW (ref 135–145)
Total Bilirubin: 1.4 mg/dL — ABNORMAL HIGH (ref 0.0–1.2)
Total Protein: 7.4 g/dL (ref 6.5–8.1)

## 2023-03-24 LAB — TROPONIN I (HIGH SENSITIVITY): Troponin I (High Sensitivity): 4 ng/L (ref <18)

## 2023-03-24 LAB — URINALYSIS, ROUTINE W REFLEX MICROSCOPIC
Bacteria, UA: NONE SEEN
Glucose, UA: NEGATIVE mg/dL
Ketones, ur: 15 mg/dL — AB
Leukocytes,Ua: NEGATIVE
Nitrite: NEGATIVE
Protein, ur: 100 mg/dL — AB
Specific Gravity, Urine: 1.029 (ref 1.005–1.030)
pH: 6 (ref 5.0–8.0)

## 2023-03-24 LAB — LIPASE, BLOOD: Lipase: 196 U/L — ABNORMAL HIGH (ref 11–51)

## 2023-03-24 MED ORDER — IOHEXOL 350 MG/ML SOLN
100.0000 mL | Freq: Once | INTRAVENOUS | Status: AC | PRN
  Administered 2023-03-24: 100 mL via INTRAVENOUS

## 2023-03-24 MED ORDER — OMEPRAZOLE 20 MG PO CPDR: 20.0000 mg | DELAYED_RELEASE_CAPSULE | Freq: Every day | ORAL | 0 refills | Status: DC

## 2023-03-24 MED ORDER — ALUM & MAG HYDROXIDE-SIMETH 200-200-20 MG/5ML PO SUSP
30.0000 mL | Freq: Once | ORAL | Status: AC
  Administered 2023-03-24: 30 mL via ORAL
  Filled 2023-03-24: qty 30

## 2023-03-24 MED ORDER — KETOROLAC TROMETHAMINE 15 MG/ML IJ SOLN
15.0000 mg | Freq: Once | INTRAMUSCULAR | Status: AC
  Administered 2023-03-24: 15 mg via INTRAVENOUS
  Filled 2023-03-24: qty 1

## 2023-03-24 MED ORDER — DICYCLOMINE HCL 10 MG PO CAPS
10.0000 mg | ORAL_CAPSULE | Freq: Once | ORAL | Status: AC
  Administered 2023-03-24: 10 mg via ORAL
  Filled 2023-03-24: qty 1

## 2023-03-24 MED ORDER — LIDOCAINE VISCOUS HCL 2 % MT SOLN
15.0000 mL | Freq: Once | OROMUCOSAL | Status: AC
  Administered 2023-03-24: 15 mL via ORAL
  Filled 2023-03-24: qty 15

## 2023-03-24 MED ORDER — DICYCLOMINE HCL 20 MG PO TABS: 20.0000 mg | ORAL_TABLET | Freq: Two times a day (BID) | ORAL | 0 refills | Status: DC

## 2023-03-24 NOTE — ED Provider Notes (Signed)
  Physical Exam  BP 131/83 (BP Location: Right Arm)   Pulse 92   Temp 99 F (37.2 C) (Oral)   Resp 18   Ht 5\' 8"  (1.727 m)   Wt 89.8 kg   SpO2 97%   BMI 30.10 kg/m   Physical Exam  Procedures  Procedures  ED Course / MDM    Medical Decision Making Amount and/or Complexity of Data Reviewed Labs: ordered. Radiology: ordered.  Risk OTC drugs. Prescription drug management.   Wardell Honour, assumed care for this patient.  In brief this is a 78 year old male who is here today for abdominal pain.  Patient was signed out pending CT imaging of the chest abdomen pelvis.  Patient's CT imaging negative aside from mildly enlarged lymph nodes which the patient is aware of due to a follicular lymphoproliferative disease for which she has been told he does not require treatment.  Patient with epigastric pain, elevated lipase.  I did obtain an ultrasound on the patient's right upper quadrant which was negative.  Patient's symptoms were improved with Bentyl, Toradol.  Likely pancreatitis, unclear cause.  Patient does not drink, does take a statin, but reports that he has not had issues with high triglycerides.  No stone.  Counseled the patient on pancreatitis diet, he would prefer to go home.  Will discharge.     Anders Simmonds T, DO 03/24/23 1749

## 2023-03-24 NOTE — ED Notes (Signed)
 Patient transported to CT

## 2023-03-24 NOTE — ED Provider Notes (Signed)
[facility] AT [facility] Provider Note   CSN: [id number] Arrival date & time: 03/24/23  1159     History  Chief Complaint  Patient presents with   Abdominal Pain        Shortness of Breath    Eric Rice is a 78 y.o. male.  HPI   78 year old male presents emergency department with complaint of abdominal pain.  This has been ongoing for the past 3 to 4 days.  Pain was initially lower abdomen but now is upper abdomen.  Sometimes right upper quadrant sometimes left upper quadrant.  Associated with decreased appetite but no nausea, vomiting or diarrhea.  Denies any issues with gallbladder or pancreas before, no surgeries in the abdomen.  He has had pain radiating down into the right groin as well but no other genitourinary symptoms.  Was noted to have a low-grade 100 degree fever yesterday morning, responded to Tylenol.  Home Medications Prior to Admission medications   Medication Sig Start Date End Date Taking? Authorizing Provider  apixaban (ELIQUIS) 5 MG TABS tablet Take 5 mg by mouth 2 (two) times daily. 03/12/23  Yes [provider]  aspirin 81 MG tablet Take 81 mg by mouth at bedtime.     [provider]  Calcium Carb-Cholecalciferol (CALCIUM 600-D) 600-10 MG-MCG TABS Take 1 tablet by mouth daily.    [provider]  Cholecalciferol (D3 2000) 50 MCG (2000 UT) CAPS Take 1 capsule by mouth daily.    [provider]  Multiple Vitamins-Minerals (CENTRUM SILVER PO) Take 1 tablet by mouth daily.    [provider]  PARoxetine (PAXIL) 40 MG tablet Take 20 mg by mouth every morning.    [provider]  simvastatin (ZOCOR) 80 MG tablet Take 40 mg by mouth at bedtime.    [provider]  Testosterone 20.25 MG/1.25GM (1.62%) GEL 3 pumps to skin every day Please dispense quantity needed for one month Patient taking differently: 3 pumps to skin every day Please dispense quantity needed for one month  08/25/15   Dianne Dun, MD      Allergies    Tricyclic antidepressants    Review of Systems   Review of Systems  Constitutional:  Positive for appetite change and fever.  Respiratory:  Negative for shortness of breath.   Cardiovascular:  Negative for chest pain.  Gastrointestinal:  Positive for abdominal pain. Negative for diarrhea and vomiting.  Skin:  Negative for rash.  Neurological:  Negative for headaches.    Physical Exam Updated Vital Signs BP 116/87 (BP Location: Right Arm)   Pulse (!) 106   Temp 97.9 F (36.6 C) (Oral)   Resp 20   Ht 5\' 8"  (1.727 m)   Wt 89.8 kg   SpO2 100%   BMI 30.10 kg/m  Physical Exam Vitals and nursing note reviewed.  Constitutional:      General: He is not in acute distress.    Appearance: Normal appearance.  HENT:     Head: Normocephalic.     Mouth/Throat:     Mouth: Mucous membranes are moist.  Cardiovascular:     Rate and Rhythm: Normal rate.  Pulmonary:     Effort: Pulmonary effort is normal. No respiratory distress.  Abdominal:     General: Bowel sounds are normal. There is no distension.     Palpations: Abdomen is soft.     Tenderness: There is generalized abdominal tenderness. There is no guarding.  Skin:  General: Skin is warm.  Neurological:     Mental Status: He is alert and oriented to person, place, and time. Mental status is at baseline.  Psychiatric:        Mood and Affect: Mood normal.     ED Results / Procedures / Treatments   Labs (all labs ordered are listed, but only abnormal results are displayed) Labs Reviewed  LIPASE, BLOOD - Abnormal; Notable for the following components:      Result Value   Lipase 196 (*)    All other components within normal limits  COMPREHENSIVE METABOLIC PANEL - Abnormal; Notable for the following components:   Sodium 134 (*)    Glucose, Bld 138 (*)    Total Bilirubin 1.4 (*)    All other components within normal limits  CBC - Abnormal; Notable for the following components:    RBC 5.89 (*)    Hemoglobin 17.5 (*)    All other components within normal limits  URINALYSIS, ROUTINE W REFLEX MICROSCOPIC - Abnormal; Notable for the following components:   APPearance HAZY (*)    Hgb urine dipstick SMALL (*)    Bilirubin Urine SMALL (*)    Ketones, ur 15 (*)    Protein, ur 100 (*)    All other components within normal limits    EKG EKG Interpretation Date/Time:  Saturday March 24 2023 12:33:38 EST Ventricular Rate:  107 PR Interval:  128 QRS Duration:  96 QT Interval:  340 QTC Calculation: 453 R Axis:   71  Text Interpretation: Sinus tachycardia Otherwise normal ECG Confirmed by Coralee Pesa ([id number]) on 03/24/2023 1:22:45 PM  Radiology No results found.  Procedures Procedures    Medications Ordered in ED Medications - No data to display  ED Course/ Medical Decision Making/ A&P                                 Medical Decision Making Amount and/or Complexity of Data Reviewed Labs: ordered. Radiology: ordered.   78 year old male presents emergency department with abdominal pain.  Initially lower abdomen but now involving almost the entire abdomen.  Was tachycardic on arrival but afebrile, stable blood pressure.  Generalized abdominal tenderness on exam without significant distention.  Blood work shows elevated lipase and bilirubin of 1.4.  Normal liver function test and normal white blood cells.  Will plan for CT imaging for further evaluation.  Patient declining any IV pain medicine at this time.  Patient signed out pending CT imaging.        Final Clinical Impression(s) / ED Diagnoses Final diagnoses:  None    Rx / DC Orders ED Discharge Orders     None         Rozelle Logan, DO 03/24/23 1450

## 2023-03-24 NOTE — ED Triage Notes (Signed)
Patient arrives with complaints of worsening abdominal pain, more pain in RLQ. Patient states that he actively has blood clots in his lungs and he is on blood thinners. Rates his pain pain a 7/10.

## 2023-03-24 NOTE — Discharge Instructions (Addendum)
I would like you to take Bentyl 2 times per day for abdominal pain.  You can also take 1000 mg of Tylenol every 8 hours, 400 mg of Motrin every 6 hours.  I have also ordered you a medication called omeprazole which is medication for heartburn which can sometimes be helpful in the situations.  If this is pancreatitis, I would like you to drink fluids, eat foods that are gentle on the stomach such as broths, light soups.  Return to the emergency room if you develop sudden worsening of your pain, or inability to eat or drink.  Please follow-up with your primary care doctor next week.

## 2023-04-06 DIAGNOSIS — R42 Dizziness and giddiness: Secondary | ICD-10-CM | POA: Diagnosis not present

## 2023-04-06 DIAGNOSIS — R2689 Other abnormalities of gait and mobility: Secondary | ICD-10-CM | POA: Diagnosis not present

## 2023-05-10 DIAGNOSIS — R262 Difficulty in walking, not elsewhere classified: Secondary | ICD-10-CM | POA: Diagnosis not present

## 2023-05-24 DIAGNOSIS — M19041 Primary osteoarthritis, right hand: Secondary | ICD-10-CM | POA: Diagnosis not present

## 2023-05-24 DIAGNOSIS — M25561 Pain in right knee: Secondary | ICD-10-CM | POA: Diagnosis not present

## 2023-05-24 DIAGNOSIS — M7541 Impingement syndrome of right shoulder: Secondary | ICD-10-CM | POA: Diagnosis not present

## 2023-06-12 DIAGNOSIS — H8111 Benign paroxysmal vertigo, right ear: Secondary | ICD-10-CM | POA: Diagnosis not present

## 2023-06-18 DIAGNOSIS — R3989 Other symptoms and signs involving the genitourinary system: Secondary | ICD-10-CM | POA: Diagnosis not present

## 2023-06-20 DIAGNOSIS — N400 Enlarged prostate without lower urinary tract symptoms: Secondary | ICD-10-CM | POA: Diagnosis not present

## 2023-06-20 DIAGNOSIS — F3341 Major depressive disorder, recurrent, in partial remission: Secondary | ICD-10-CM | POA: Diagnosis not present

## 2023-06-20 DIAGNOSIS — F411 Generalized anxiety disorder: Secondary | ICD-10-CM | POA: Diagnosis not present

## 2023-06-20 DIAGNOSIS — E7800 Pure hypercholesterolemia, unspecified: Secondary | ICD-10-CM | POA: Diagnosis not present

## 2023-07-21 DIAGNOSIS — N400 Enlarged prostate without lower urinary tract symptoms: Secondary | ICD-10-CM | POA: Diagnosis not present

## 2023-07-21 DIAGNOSIS — E7800 Pure hypercholesterolemia, unspecified: Secondary | ICD-10-CM | POA: Diagnosis not present

## 2023-07-21 DIAGNOSIS — F411 Generalized anxiety disorder: Secondary | ICD-10-CM | POA: Diagnosis not present

## 2023-07-21 DIAGNOSIS — F3341 Major depressive disorder, recurrent, in partial remission: Secondary | ICD-10-CM | POA: Diagnosis not present

## 2023-08-03 DIAGNOSIS — L738 Other specified follicular disorders: Secondary | ICD-10-CM | POA: Diagnosis not present

## 2023-08-03 DIAGNOSIS — L814 Other melanin hyperpigmentation: Secondary | ICD-10-CM | POA: Diagnosis not present

## 2023-08-03 DIAGNOSIS — Z85828 Personal history of other malignant neoplasm of skin: Secondary | ICD-10-CM | POA: Diagnosis not present

## 2023-08-03 DIAGNOSIS — L821 Other seborrheic keratosis: Secondary | ICD-10-CM | POA: Diagnosis not present

## 2023-08-03 DIAGNOSIS — D1801 Hemangioma of skin and subcutaneous tissue: Secondary | ICD-10-CM | POA: Diagnosis not present

## 2023-08-03 DIAGNOSIS — L820 Inflamed seborrheic keratosis: Secondary | ICD-10-CM | POA: Diagnosis not present

## 2023-08-03 DIAGNOSIS — Z8589 Personal history of malignant neoplasm of other organs and systems: Secondary | ICD-10-CM | POA: Diagnosis not present

## 2023-08-03 DIAGNOSIS — D225 Melanocytic nevi of trunk: Secondary | ICD-10-CM | POA: Diagnosis not present

## 2023-08-03 DIAGNOSIS — L578 Other skin changes due to chronic exposure to nonionizing radiation: Secondary | ICD-10-CM | POA: Diagnosis not present

## 2023-08-20 DIAGNOSIS — N400 Enlarged prostate without lower urinary tract symptoms: Secondary | ICD-10-CM | POA: Diagnosis not present

## 2023-08-20 DIAGNOSIS — F3341 Major depressive disorder, recurrent, in partial remission: Secondary | ICD-10-CM | POA: Diagnosis not present

## 2023-08-20 DIAGNOSIS — F411 Generalized anxiety disorder: Secondary | ICD-10-CM | POA: Diagnosis not present

## 2023-08-20 DIAGNOSIS — E7800 Pure hypercholesterolemia, unspecified: Secondary | ICD-10-CM | POA: Diagnosis not present

## 2023-09-13 DIAGNOSIS — Z5181 Encounter for therapeutic drug level monitoring: Secondary | ICD-10-CM | POA: Diagnosis not present

## 2023-09-13 DIAGNOSIS — K6289 Other specified diseases of anus and rectum: Secondary | ICD-10-CM | POA: Diagnosis not present

## 2023-09-13 DIAGNOSIS — R203 Hyperesthesia: Secondary | ICD-10-CM | POA: Diagnosis not present

## 2023-09-20 DIAGNOSIS — E7800 Pure hypercholesterolemia, unspecified: Secondary | ICD-10-CM | POA: Diagnosis not present

## 2023-09-20 DIAGNOSIS — F411 Generalized anxiety disorder: Secondary | ICD-10-CM | POA: Diagnosis not present

## 2023-09-20 DIAGNOSIS — N400 Enlarged prostate without lower urinary tract symptoms: Secondary | ICD-10-CM | POA: Diagnosis not present

## 2023-09-20 DIAGNOSIS — F3341 Major depressive disorder, recurrent, in partial remission: Secondary | ICD-10-CM | POA: Diagnosis not present

## 2023-09-25 DIAGNOSIS — M25811 Other specified joint disorders, right shoulder: Secondary | ICD-10-CM | POA: Diagnosis not present

## 2023-09-25 DIAGNOSIS — M13841 Other specified arthritis, right hand: Secondary | ICD-10-CM | POA: Diagnosis not present

## 2023-09-26 DIAGNOSIS — M542 Cervicalgia: Secondary | ICD-10-CM | POA: Diagnosis not present

## 2023-10-10 DIAGNOSIS — R42 Dizziness and giddiness: Secondary | ICD-10-CM | POA: Diagnosis not present

## 2023-10-10 DIAGNOSIS — Z131 Encounter for screening for diabetes mellitus: Secondary | ICD-10-CM | POA: Diagnosis not present

## 2023-10-11 ENCOUNTER — Encounter: Payer: Self-pay | Admitting: Physical Therapy

## 2023-10-11 ENCOUNTER — Other Ambulatory Visit: Payer: Self-pay

## 2023-10-11 ENCOUNTER — Ambulatory Visit: Attending: Urology | Admitting: Physical Therapy

## 2023-10-11 DIAGNOSIS — R293 Abnormal posture: Secondary | ICD-10-CM | POA: Insufficient documentation

## 2023-10-11 DIAGNOSIS — M6281 Muscle weakness (generalized): Secondary | ICD-10-CM | POA: Diagnosis not present

## 2023-10-11 DIAGNOSIS — R278 Other lack of coordination: Secondary | ICD-10-CM | POA: Diagnosis not present

## 2023-10-11 NOTE — Therapy (Unsigned)
 OUTPATIENT PHYSICAL THERAPY MALE PELVIC EVALUATION   Patient Name: Eric Rice MRN: [id number] DOB:Aug 03, 1945, 78 y.o., male Today's Date: 10/11/2023  END OF SESSION:  PT End of Session - 10/11/23 1746     Visit Number 1    Date for PT Re-Evaluation 01/11/24    Authorization Type MEDICARE PART A AND B    Progress Note Due on Visit 10    PT Start Time 1615    PT Stop Time 1710    PT Time Calculation (min) 55 min    Activity Tolerance Patient tolerated treatment well;Patient limited by pain    Behavior During Therapy WFL for tasks assessed/performed          Past Medical History:  Diagnosis Date   Arthritis    Benign localized prostatic hyperplasia with lower urinary tract symptoms (LUTS)    urologist--- dr pace   Bladder outlet obstruction    Cervical stenosis of spine    w/  spondylosis   ED (erectile dysfunction)    Fibromyalgia    Follicular lymphoma, unspecified body region, 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   followed by [facility] oncology-- dr rocky slain;   hx enlarged lymph nodes since 2013;  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, treatment active survillance every 6 months   History of chest pain    neg adenosine myoview in 2008   History of pulmonary embolus (PE) 11/2011   bilateral lower lobes and right upper lobe,  provoked long car travel,  completed 6 months xarelto   (12-06-2021  pt stated never had clots before or since 2013   History of skin cancer 03/04/2013   Middle Of Nose Atypical Trichilemmoma    Hyperlipidemia    Impingement of right shoulder    Primary hypogonadism in male    PTSD (post-traumatic stress disorder)    Past Surgical History:  Procedure Laterality Date   RHINOPLASTY     x2   last one 1970s   TONSILLECTOMY     child   TRANSURETHRAL RESECTION OF PROSTATE N/A 09/13/2021   Procedure: TRANSURETHRAL RESECTION OF THE PROSTATE (TURP);  Surgeon: Elisabeth Valli BIRCH, MD;  Location: [facility];  Service:  Urology;  Laterality: N/A;   TRANSURETHRAL RESECTION OF PROSTATE N/A 12/13/2021   Procedure: REPEAT TRANSURETHRAL RESECTION OF THE PROSTATE (TURP)/URETHRAL DILATION;  Surgeon: Elisabeth Valli BIRCH, MD;  Location: [facility];  Service: Urology;  Laterality: N/A;  45 MINS   Patient Active Problem List   Diagnosis Date Noted   Cerebral vascular disease 06/20/2022   BPH with obstruction/lower urinary tract symptoms 09/13/2021   Cervical spondylosis 11/02/2020   Bilateral shoulder pain 11/02/2020   Bilateral hip pain 07/31/2017   Fatigue 03/26/2017   Medicare annual wellness visit, subsequent 01/09/2017   DOE (dyspnea on exertion) 07/20/2015   History of pulmonary embolus (PE) 03/29/2015   Hyperlipidemia 01/08/2014   Coronary atherosclerosis of native coronary artery 01/05/2014   Abnormal ultrasound of carotid artery 08/15/2013   Polymyalgia (HCC) 08/15/2013   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   Excess weight 08/04/2013   Diuresis excessive 08/04/2013   Blood pressure elevated without history of HTN 01/31/2013   Nocturia 11/19/2012   Fibromyalgia 11/19/2012   Low testosterone  05/29/2012   Acute pulmonary embolism (HCC) 11/30/2011   Long term use of drug 11/28/2010   Diverticulosis of colon 03/09/2010   Clinical depression 07/30/2008   Elevated prostate specific antigen (PSA) 07/30/2008   Neurosis, posttraumatic 07/30/2008    PCP: Alben,  Therisa MATSU, PA PCP - General  REFERRING PROVIDER: Lyell Geofm BRAVO, MD  REFERRING DIAG: pelvic pain  THERAPY DIAG:  Muscle weakness (generalized)  Abnormal posture  Other lack of coordination  Rationale for Evaluation and Treatment: Rehabilitation  ONSET DATE: 2020  SUBJECTIVE:                                                                                                                                                                                           SUBJECTIVE STATEMENT: Pain was going for a while, but came back 5 months  ago.  Patient reports that he has been to 2 other pelvic PT in the past, last one 2 years ago. Did not help much.  None of the exercises did any good.  This is an issues that he has for 3-4 year- right groin pain. (Points to more in the middle) First therapist tried internal work.  Patient reports that he has constant pain.  Has had 2 TURP procedures, pain is 24/7.  Ruled out hernia a while back Used to take pain meds, stopped Urological issues  for 20 years When it gets real bad, he takes Aleve Has slow growing cancer in his abdomen- not treating it TURP made it worse- has to get up 2/ night  PAIN:  Are you having pain? Yes NPRS scale: 5-8/10 Pain location: Internal, Deep, Right, Anterior, and middle  Pain type: sharp Pain description: intermittent   Aggravating factors: nothing, just there 70% of time Relieving factors: nothing, moves around when it is bad  PRECAUTIONS: None  RED FLAGS: None   WEIGHT BEARING RESTRICTIONS: No  FALLS:  Has patient fallen in last 6 months? No  OCCUPATION: retired  ACTIVITY LEVEL : not very active- because of the pain partially- started to walk but groin muscles hurt.   PLOF: Independent  PATIENT GOALS:  reduced pain  PERTINENT HISTORY:  2 TURP procedure Sexual abuse: No  BOWEL MOVEMENT: no issues   URINATION: Pain with urination: No Fully empty bladder: No Stream: Strong normal Urgency: No Frequency: yes, 2x night - varies throughout the day Fluid Intake: water and milk Leakage: a little bit Pads: Yes: paper towels  INTERCOURSE: no issues       OBJECTIVE:  Note: Objective measures were completed at Evaluation unless otherwise noted.   PATIENT SURVEYS:   PFIQ-7: UIQ-7- 67  COGNITION: Overall cognitive status: Within functional limits for tasks assessed     SENSATION: Light touch: Appears intact   FUNCTIONAL TESTS:  Squat: difficult Single leg stance: decreased balance bilat   Curl-up test: 1/4 with  DRA with doming- 3 fingers throughout  GAIT: Assistive device utilized: None Comments: antalgic  POSTURE: rounded shoulders, forward head, decreased lumbar lordosis, decreased thoracic kyphosis, and flexed trunk    LUMBARAROM/PROM:  A/PROM A/PROM  Eval (% available)  Flexion 75%  Extension 75  Right lateral flexion 75  Left lateral flexion 75  Right rotation 75  Left rotation 75   (Blank rows = not tested)  PALPATION:   General: gentle palpation - very tender suprapubic area - reproduced pain patient is coming with  Pelvic Alignment: even  Abdominal: large diastasis rectus                External Perineal Exam: within functional limitations                              Internal Pelvic Floor: unable to push therapist's finger out  Patient confirms identification and approves PT to assess internal pelvic floor and treatment Yes  PELVIC MMT:   MMT eval     Internal Anal Sphincter 4/5  External Anal Sphincter 4/5  Puborectalis   Diastasis Recti Yes- 3 fingers throughout with doming with any transfer  (Blank rows = not tested)        TONE: average   TODAY'S TREATMENT:                                                                                                                              DATE:  10/11/2023 EVAL  Manual: abdominal work around bladder- very irritable area- more medially and on right    Exercises: half kneeling stretch bilateral, horizontal abduction abduction with theraband  with transverse abdominis breath   Therapeutic activities: Pt was educated on relevant anatomy, exam findings, home exercise program, plan of care, expectations of PT       PATIENT EDUCATION:  Education details: See above Person educated: Patient Education method: Explanation, Demonstration, Tactile cues, Verbal cues, and Handouts Education comprehension: verbalized understanding  HOME EXERCISE PROGRAM: Access Code: [email or web address] URL:  [email or web address] Date: 10/11/2023 Prepared by: Cori Dawnell Bryant  Exercises - Half-Kneeling to Tall Kneeling With Upper Extremity Support  - 1 x daily - 7 x weekly - 2 sets - 10 reps - Horizontal abd with TB with TRA breath  - 1 x daily - 7 x weekly - 2 sets - 10 reps  ASSESSMENT:  CLINICAL IMPRESSION: Patient is a 78 y.o. M who was seen today for physical therapy evaluation and treatment for lower abdominal/ pelvic pain. He has had this pain for about 4-5 years and has seen 2 pelvic PTs without success. Findings notable to large abdominal diastasis, irritable area in soft tissue above pubic bone in lower portion on rectus abdominis, bilateral hip weakness, decreased balance, some lumbar and thoracic stiffness. Good pelvic floor strength rectally with difficulty with lengthening. Patient has been trying to walk with his wife and reported pain in bilateral hips after 1/2 mile.   OBJECTIVE  IMPAIRMENTS: decreased activity tolerance, decreased coordination, decreased endurance, decreased mobility, decreased ROM, decreased strength, increased fascial restrictions, increased muscle spasms, impaired flexibility, impaired tone, improper body mechanics, postural dysfunction, and pain.   ACTIVITY LIMITATIONS: standing, continence, toileting, and locomotion level  PARTICIPATION LIMITATIONS: community activity  PERSONAL FACTORS: Age, Behavior pattern, and Time since onset of injury/illness/exacerbation are also affecting patient's functional outcome.   REHAB POTENTIAL: Good  CLINICAL DECISION MAKING: Evolving/moderate complexity  EVALUATION COMPLEXITY: Moderate   GOALS: Goals reviewed with patient? Yes  SHORT TERM GOALS: Target date: 11/08/2023    Pt will be independent with HEP.   Baseline: Goal status: INITIAL  2.  Patient will report reduced pelvic pain to max 3/10 with suprapubic  palpation Baseline:  Goal status: INITIAL    LONG TERM GOALS: Target date: 01/03/2024     Pt will be independent with advanced HEP.   Baseline:  Goal status: INITIAL  2.  Patient will be able to walk at least 1 mile without increased groin pain  Baseline:  Goal status: INITIAL  3.  Patient will report max 2/10 pelvic pain in a 24 hour period Baseline:  Goal status: INITIAL  4.  Patient will get up maximum 1 time/ night to urinate Baseline: 2 Goal status: INITIAL  5.  Patient will have improved bilateral lower extremity strength to 5/5  Baseline:  Goal status: INITIAL  6.  Patient will demonstrate improved abdominal strength to 4/5 at least with reduced diastasis doming to improve ease of transfers Baseline:  Goal status: INITIAL  PLAN:  PT FREQUENCY: 1-2x/week  PT DURATION: 12 weeks   PLANNED INTERVENTIONS: 97164- PT Re-evaluation, 97110-Therapeutic exercises, 97530- Therapeutic activity, 97112- Neuromuscular re-education, 97535- Self Care, [id number]- Manual therapy, [phone]- Gait training, [phone]- Aquatic Therapy, [phone]- Electrical stimulation (unattended), [phone]- Traction (mechanical), [id number]- Ionotophoresis 4mg /ml Dexamethasone , 79439 (1-2 muscles), 20561 (3+ muscles)- Dry Needling, Patient/Family education, Balance training, Taping, Joint mobilization, Joint manipulation, Spinal manipulation, Spinal mobilization, Scar mobilization, Vestibular training, Cryotherapy, Moist heat, and Biofeedback  PLAN FOR NEXT SESSION: abdominal dry needling trial, soft tissue mobilization/ cupping, add exercises for abdominal and hip strengthening   Abeer Deskins, PT 10/11/2023, 5:49 PM

## 2023-10-15 DIAGNOSIS — D473 Essential (hemorrhagic) thrombocythemia: Secondary | ICD-10-CM | POA: Diagnosis not present

## 2023-10-15 DIAGNOSIS — E119 Type 2 diabetes mellitus without complications: Secondary | ICD-10-CM | POA: Diagnosis not present

## 2023-10-23 ENCOUNTER — Encounter: Payer: Self-pay | Admitting: Cardiology

## 2023-10-23 ENCOUNTER — Ambulatory Visit: Attending: Urology | Admitting: Physical Therapy

## 2023-10-23 ENCOUNTER — Ambulatory Visit: Attending: Cardiology | Admitting: Cardiology

## 2023-10-23 VITALS — BP 122/74 | HR 77 | Ht 68.5 in | Wt 192.4 lb

## 2023-10-23 DIAGNOSIS — R42 Dizziness and giddiness: Secondary | ICD-10-CM | POA: Diagnosis not present

## 2023-10-23 DIAGNOSIS — M6281 Muscle weakness (generalized): Secondary | ICD-10-CM | POA: Diagnosis present

## 2023-10-23 DIAGNOSIS — R0989 Other specified symptoms and signs involving the circulatory and respiratory systems: Secondary | ICD-10-CM | POA: Diagnosis not present

## 2023-10-23 DIAGNOSIS — Z8249 Family history of ischemic heart disease and other diseases of the circulatory system: Secondary | ICD-10-CM | POA: Insufficient documentation

## 2023-10-23 DIAGNOSIS — Z86711 Personal history of pulmonary embolism: Secondary | ICD-10-CM | POA: Diagnosis not present

## 2023-10-23 NOTE — Progress Notes (Signed)
 Cardiology Office Note   Date:  10/23/2023  ID:  Eric Rice, DOB 1945/09/14, MRN [id number] PCP: Alben Therisa MATSU, PA  [facility] Providers Cardiologist:  Soyla DELENA Merck, MD   History of Present Illness Eric Rice is a 78 y.o. male with a past medical history of coronary calcifications on CT scan, dizziness, family history of CAD, PE in 02/2023.  Presents today for evaluation of dizziness.  Patient previously underwent nuclear stress test in 12/2013 that was a normal study.  Patient has been seen by his PCP and was referred to cardiology for evaluation of worsening dizziness.  Also concerns for family history of coronary artery disease with father having MI in 22s, identical twin with history of 2 MIs.  Per referral notes, dizziness has progressed from occasional episodes to almost daily occurrences.  Labs significant for hemoglobin 17.7, creatinine 1.00.  On interview patient reports that he has been having issues with dizziness/lightheadedness for quite some time.  Reports that several years ago he had a bad episode of dizziness when laying flat on his back fixing a table since then, he is continue to have occasional episodes of dizziness where he feels like the room is spinning around him that he is spinning.  Around 4 days ago, he had a very severe episode of vertigo/feeling like the room for him was spinning.  This was associated with nausea and got so intense he felt he might pass out.  He tried to lay down but he could not get the spinning sensation to go away.  Since then, he has been having increased episodes of this dizziness.  This can occur if he turns his head too quickly or turns his head to look upright.  Can also occur randomly.  Does not seem to be positional.  He is scheduled to see somebody for evaluation of BPPV.  He denies any chest pain or shortness of breath.  Denies palpitations.  He notes that he has been weaning himself off his Paxil  for the past 4 months or so.   He has been on this medication for 30 years or so.  He has a family history of CAD in his father and his twin brother.  Studies Reviewed Cardiac Studies & Procedures   ______________________________________________________________________________________________              ______________________________________________________________________________________________       Risk Assessment/Calculations    Physical Exam VS:  BP 122/74   Pulse 77   Ht 5' 8.5 (1.74 m)   Wt 192 lb 6.4 oz (87.3 kg)   SpO2 96%   BMI 28.83 kg/m   Orthostatic VS for the past 24 hrs (Last 3 readings):  BP- Lying Pulse- Lying BP- Sitting Pulse- Sitting BP- Standing at 0 minutes Pulse- Standing at 0 minutes BP- Standing at 3 minutes Pulse- Standing at 3 minutes  10/23/23 1508 150/84 80 122/74 84 145/84 88 145/79 91      Wt Readings from Last 3 Encounters:  10/23/23 192 lb 6.4 oz (87.3 kg)  03/24/23 197 lb 15.6 oz (89.8 kg)  06/20/22 198 lb (89.8 kg)    GEN: Well nourished, well developed in no acute distress. Sitting upright on the exam table  NECK: No JVD; Faint bilateral carotid bruits  CARDIAC: RRR, no murmurs, rubs, gallops. Radial pulses 2+ bilaterally  RESPIRATORY:  Clear to auscultation without rales, wheezing or rhonchi. Normal WOB on room air   ABDOMEN: Soft, non-tender, non-distended EXTREMITIES:  No edema in  BLE; No deformity   ASSESSMENT AND PLAN  Dizziness  - Patient has been having issues with dizziness over the years.  Reports intermittent episodes of dizziness and lightheadedness that have been increasing in frequency over time.  Notes that 4 days ago he had a particularly intense episode of dizziness where he felt the room was spinning around him.  He tried to lay down, continue to feel the spinning sensation.  Felt nauseous.  Since then, he has had 2-3 episodes of this spinning sensation per day - Dizziness tends to occur randomly, does worsen if he moves his head quickly or  looks upright - Orthostatic vital signs in office today show that BP dropped from 150/84->120/74 when lying to sitting.  Blood pressure did not drop upon standing.  He notes that he often has dizziness that is not associated with position changes, so I am not convinced that this is orthostatic hypotension. - Patient has also been weaning himself off his Paxil .  He did wean slowly over the course of 4 months or so.  He had previously been on this medication for 30 years.  Suspect that his dizziness in part may be due to withdrawal symptoms - Patient is going to see a provider to discuss BPPV.  Encouraged him to complete this workup - Low suspicion for cardiac cause of dizziness.  Encourage patient to increase hydration and make position changes slowly  Bilateral Carotid Bruit  - Patient has faint bilateral carotid bruits on exam today.  Ordered bilateral carotid Dopplers  Family history of CAD  -Patient has a family history of CAD with both his father and his twin brother having history of MI.  He previously had a stress test in 2015 that was a normal study.  EKG today nonischemic.  He is not having any symptoms concerning for angina. No indication for repeat ischemic evaluation at this time.  Continue aspirin 81 mg daily and simvastatin  40 mg daily  History of PE  - On eliquis. Denies bleeding on anticoagulation   Dispo: Follow up in 6 months with Dr. Loni   Signed, Rollo FABIENE Louder, PA-C

## 2023-10-23 NOTE — Therapy (Signed)
 OUTPATIENT PHYSICAL THERAPY MALE PELVIC TREATMENT   Patient Name: Eric Rice MRN: [id number] DOB:1945/07/29, 78 y.o., male Today's Date: 10/23/2023  END OF SESSION:  PT End of Session - 10/23/23 1339     Visit Number 2    Date for PT Re-Evaluation 01/11/24    Authorization Type MEDICARE PART A AND B    Progress Note Due on Visit 10    PT Start Time 1230    PT Stop Time 1315    PT Time Calculation (min) 45 min    Activity Tolerance Patient tolerated treatment well;Patient limited by pain    Behavior During Therapy [facility] for tasks assessed/performed           Past Medical History:  Diagnosis Date   Arthritis    Benign localized prostatic hyperplasia with lower urinary tract symptoms (LUTS)    urologist--- dr pace   Bladder outlet obstruction    Cervical stenosis of spine    w/  spondylosis   ED (erectile dysfunction)    Fibromyalgia    Follicular lymphoma, unspecified body region, 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   followed by [facility] oncology-- dr rocky slain;   hx enlarged lymph nodes since 2013;  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, treatment active survillance every 6 months   History of chest pain    neg adenosine myoview in 2008   History of pulmonary embolus (PE) 11/2011   bilateral lower lobes and right upper lobe,  provoked long car travel,  completed 6 months xarelto   (12-06-2021  pt stated never had clots before or since 2013   History of skin cancer 03/04/2013   Middle Of Nose Atypical Trichilemmoma    Hyperlipidemia    Impingement of right shoulder    Primary hypogonadism in male    PTSD (post-traumatic stress disorder)    Past Surgical History:  Procedure Laterality Date   RHINOPLASTY     x2   last one 1970s   TONSILLECTOMY     child   TRANSURETHRAL RESECTION OF PROSTATE N/A 09/13/2021   Procedure: TRANSURETHRAL RESECTION OF THE PROSTATE (TURP);  Surgeon: Elisabeth Valli BIRCH, MD;  Location: [facility];  Service:  Urology;  Laterality: N/A;   TRANSURETHRAL RESECTION OF PROSTATE N/A 12/13/2021   Procedure: REPEAT TRANSURETHRAL RESECTION OF THE PROSTATE (TURP)/URETHRAL DILATION;  Surgeon: Elisabeth Valli BIRCH, MD;  Location: [facility];  Service: Urology;  Laterality: N/A;  45 MINS   Patient Active Problem List   Diagnosis Date Noted   Cerebral vascular disease 06/20/2022   BPH with obstruction/lower urinary tract symptoms 09/13/2021   Cervical spondylosis 11/02/2020   Bilateral shoulder pain 11/02/2020   Bilateral hip pain 07/31/2017   Fatigue 03/26/2017   Medicare annual wellness visit, subsequent 01/09/2017   DOE (dyspnea on exertion) 07/20/2015   History of pulmonary embolus (PE) 03/29/2015   Hyperlipidemia 01/08/2014   Coronary atherosclerosis of native coronary artery 01/05/2014   Abnormal ultrasound of carotid artery 08/15/2013   Polymyalgia (HCC) 08/15/2013   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   Excess weight 08/04/2013   Diuresis excessive 08/04/2013   Blood pressure elevated without history of HTN 01/31/2013   Nocturia 11/19/2012   Fibromyalgia 11/19/2012   Low testosterone  05/29/2012   Acute pulmonary embolism (HCC) 11/30/2011   Long term use of drug 11/28/2010   Diverticulosis of colon 03/09/2010   Clinical depression 07/30/2008   Elevated prostate specific antigen (PSA) 07/30/2008   Neurosis, posttraumatic 07/30/2008    PCP:  Alben Therisa MATSU, PA PCP - General  REFERRING PROVIDER: Reissmann, Molly E, MD  REFERRING DIAG: pelvic pain  THERAPY DIAG:  Muscle weakness (generalized)  Rationale for Evaluation and Treatment: Rehabilitation ONSET DATE: 2020  SUBJECTIVE:                                                                                                                                                                                           SUBJECTIVE STATEMENT: Patient reports that he was recently diagnosed with type 2 diabetes which is being managed with diet  and exercise. He is also dealing with a bout of vertigo that started 4 days ago, seeing a cardiologist later for this. He is currently experiencing groin pain in the suprapubic region 2/10.   From eval: Pain was going for a while, but came back 5 months ago.  Patient reports that he has been to 2 other pelvic PT in the past, last one 2 years ago. Did not help much.  None of the exercises did any good.  This is an issues that he has for 3-4 year- right groin pain. (Points to more in the middle) First therapist tried internal work.  Patient reports that he has constant pain.  Has had 2 TURP procedures, pain is 24/7.  Ruled out hernia a while back Used to take pain meds, stopped Urological issues  for 20 years When it gets real bad, he takes Aleve Has slow growing cancer in his abdomen- not treating it TURP made it worse- has to get up 2/ night  PAIN:  Are you having pain? Yes NPRS scale: 5-8/10 Pain location: Internal, Deep, Right, Anterior, and middle  Pain type: sharp Pain description: intermittent   Aggravating factors: nothing, just there 70% of time Relieving factors: nothing, moves around when it is bad  PRECAUTIONS: None  RED FLAGS: None   WEIGHT BEARING RESTRICTIONS: No  FALLS:  Has patient fallen in last 6 months? No  OCCUPATION: retired  ACTIVITY LEVEL : not very active- because of the pain partially- started to walk but groin muscles hurt.   PLOF: Independent  PATIENT GOALS:  reduced pain  PERTINENT HISTORY:  2 TURP procedure Sexual abuse: No  BOWEL MOVEMENT: no issues  URINATION: Pain with urination: No Fully empty bladder: No Stream: Strong normal Urgency: No Frequency: yes, 2x night - varies throughout the day Fluid Intake: water and milk Leakage: a little bit Pads: Yes: paper towels  INTERCOURSE: no issues  OBJECTIVE:  Note: Objective measures were completed at Evaluation unless otherwise noted.   PATIENT SURVEYS:   PFIQ-7: UIQ-7-  67  COGNITION: Overall cognitive status: Within  functional limits for tasks assessed     SENSATION: Light touch: Appears intact   FUNCTIONAL TESTS:  Squat: difficult Single leg stance: decreased balance bilat   Curl-up test: 1/4 with DRA with doming- 3 fingers throughout   GAIT: Assistive device utilized: None Comments: antalgic  POSTURE: rounded shoulders, forward head, decreased lumbar lordosis, decreased thoracic kyphosis, and flexed trunk    LUMBARAROM/PROM:  A/PROM A/PROM  Eval (% available)  Flexion 75%  Extension 75  Right lateral flexion 75  Left lateral flexion 75  Right rotation 75  Left rotation 75   (Blank rows = not tested)  PALPATION:   General: gentle palpation - very tender suprapubic area - reproduced pain patient is coming with  Pelvic Alignment: even  Abdominal: large diastasis rectus                External Perineal Exam: within functional limitations                              Internal Pelvic Floor: unable to push therapist's finger out  Patient confirms identification and approves PT to assess internal pelvic floor and treatment Yes  PELVIC MMT:   MMT eval     Internal Anal Sphincter 4/5  External Anal Sphincter 4/5  Puborectalis   Diastasis Recti Yes- 3 fingers throughout with doming with any transfer  (Blank rows = not tested)        TONE: average   TODAY'S TREATMENT:                                                                                                                              DATE:  10/11/2023 EVAL  Manual: abdominal work around bladder- very irritable area- more medially and on right    Exercises: half kneeling stretch bilateral, horizontal abduction abduction with theraband  with transverse abdominis breath   Therapeutic activities: Pt was educated on relevant anatomy, exam findings, home exercise program, plan of care, expectations of PT    10/23/23:  Neuro re-ed: Hooklying diaphragmatic breathing +  pelvic floor lengthening with inhalation + shortening with exhalation 2x10  Supine hip flexor stretch with leg off side of bed/couch + diaphragmatic breathing 2x27min  Manual therapy: Internal rectal examination + assessment of pelvic floor musculature  Internal cueing for lengthening of rectum during inhalation and shortening during exhalation  Self care:  Relative anatomy, connection between the diaphragm and pelvic floor, how straining affects suprapubic/abdominal discomfort due to pressure changes   PATIENT EDUCATION:  Education details: See above Person educated: Patient Education method: Explanation, Demonstration, Tactile cues, Verbal cues, and Handouts Education comprehension: verbalized understanding  HOME EXERCISE PROGRAM: Access Code: [email or web address] URL: [email or web address] Date: 10/23/2023 Prepared by: Celena Domino  Exercises - Supine Pelvic Floor Contraction  - 1 x daily - 7 x weekly - 2 sets - 10 reps - Hip Flexor Stretch at [facility] of Bed  -  1 x daily - 7 x weekly - 2 sets - hold - Half-Kneeling to Tall Kneeling With Upper Extremity Support  - 1 x daily - 7 x weekly - 2 sets - 10 reps - Horizontal abd with TB with TRA breath  - 1 x daily - 7 x weekly - 2 sets - 10 reps  ASSESSMENT:  CLINICAL IMPRESSION: Patient is a 78 y.o. M who was seen today for physical therapy treatment for lower abdominal/ pelvic pain. He has had this pain for about 4-5 years and has seen 2 pelvic PTs without success. He reports baseline 2/10 pain today in the suprapubic region. He fully consents to today's internal rectal examination. He demonstrates a lack of coordination between the pelvic floor and diaphragm and required internal cueing to properly perform pelvic floor AROM exercises. Hip flexor/groin stretch introduced to decrease tension around hip flexors from increasing walking mileage recently. Overall, pt tolerated session well and Pt would benefit from additional PT to further  address deficits.    OBJECTIVE IMPAIRMENTS: decreased activity tolerance, decreased coordination, decreased endurance, decreased mobility, decreased ROM, decreased strength, increased fascial restrictions, increased muscle spasms, impaired flexibility, impaired tone, improper body mechanics, postural dysfunction, and pain.   ACTIVITY LIMITATIONS: standing, continence, toileting, and locomotion level  PARTICIPATION LIMITATIONS: community activity  PERSONAL FACTORS: Age, Behavior pattern, and Time since onset of injury/illness/exacerbation are also affecting patient's functional outcome.   REHAB POTENTIAL: Good  CLINICAL DECISION MAKING: Evolving/moderate complexity  EVALUATION COMPLEXITY: Moderate   GOALS: Goals reviewed with patient? Yes  SHORT TERM GOALS: Target date: 11/08/2023    Pt will be independent with HEP.   Baseline: Goal status: INITIAL  2.  Patient will report reduced pelvic pain to max 3/10 with suprapubic  palpation Baseline:  Goal status: INITIAL    LONG TERM GOALS: Target date: 01/03/2024    Pt will be independent with advanced HEP.   Baseline:  Goal status: INITIAL  2.  Patient will be able to walk at least 1 mile without increased groin pain  Baseline:  Goal status: INITIAL  3.  Patient will report max 2/10 pelvic pain in a 24 hour period Baseline:  Goal status: INITIAL  4.  Patient will get up maximum 1 time/ night to urinate Baseline: 2 Goal status: INITIAL  5.  Patient will have improved bilateral lower extremity strength to 5/5  Baseline:  Goal status: INITIAL  6.  Patient will demonstrate improved abdominal strength to 4/5 at least with reduced diastasis doming to improve ease of transfers Baseline:  Goal status: INITIAL  PLAN:  PT FREQUENCY: 1-2x/week  PT DURATION: 12 weeks   PLANNED INTERVENTIONS: 97164- PT Re-evaluation, 97110-Therapeutic exercises, 97530- Therapeutic activity, 97112- Neuromuscular re-education, 97535- Self  Care, 02859- Manual therapy, [phone]- Gait training, [phone]- Aquatic Therapy, [phone]- Electrical stimulation (unattended), [phone]- Traction (mechanical), [id number]- Ionotophoresis 4mg /ml Dexamethasone , 79439 (1-2 muscles), [id number] (3+ muscles)- Dry Needling, Patient/Family education, Balance training, Taping, Joint mobilization, Joint manipulation, Spinal manipulation, Spinal mobilization, Scar mobilization, Vestibular training, Cryotherapy, Moist heat, and Biofeedback  PLAN FOR NEXT SESSION: abdominal dry needling trial, soft tissue mobilization/ cupping, add exercises for abdominal and hip strengthening   Celena JAYSON Domino, PT 10/23/2023, 1:39 PM

## 2023-10-23 NOTE — Patient Instructions (Signed)
 Medication Instructions:  No changes *If you need a refill on your cardiac medications before your next appointment, please call your pharmacy*  Lab Work: No labs If you have labs (blood work) drawn today and your tests are completely normal, you will receive your results only by: MyChart Message (if you have MyChart) OR A paper copy in the mail If you have any lab test that is abnormal or we need to change your treatment, we will call you to review the results.  Testing/Procedures: Your physician has requested that you have a carotid duplex. This test is an ultrasound of the carotid arteries in your neck. It looks at blood flow through these arteries that supply the brain with blood. Allow one hour for this exam. There are no restrictions or special instructions. This will take place at [address], 4th floor  Please note: We ask at that you not bring children with you during ultrasound (echo/ vascular) testing. Due to room size and safety concerns, children are not allowed in the ultrasound rooms during exams. Our front office staff cannot provide observation of children in our lobby area while testing is being conducted. An adult accompanying a patient to their appointment will only be allowed in the ultrasound room at the discretion of the ultrasound technician under special circumstances. We apologize for any inconvenience.  Follow-Up: At [facility], you and your health needs are our priority.  As part of our continuing mission to provide you with exceptional heart care, our providers are all part of one team.  This team includes your primary Cardiologist (physician) and Advanced Practice Providers or APPs (Physician Assistants and Nurse Practitioners) who all work together to provide you with the care you need, when you need it.  Your next appointment:   6 month(s)  Provider:   Gayatri A Acharya, MD

## 2023-10-25 ENCOUNTER — Ambulatory Visit (HOSPITAL_COMMUNITY)
Admission: RE | Admit: 2023-10-25 | Discharge: 2023-10-25 | Disposition: A | Discharge status: Home / Self Care | Source: Ambulatory Visit | Attending: Cardiology | Admitting: Cardiology

## 2023-10-25 DIAGNOSIS — R42 Dizziness and giddiness: Secondary | ICD-10-CM | POA: Insufficient documentation

## 2023-10-29 ENCOUNTER — Ambulatory Visit: Payer: Self-pay | Admitting: Cardiology

## 2023-11-07 DIAGNOSIS — Z1331 Encounter for screening for depression: Secondary | ICD-10-CM | POA: Diagnosis not present

## 2023-11-07 DIAGNOSIS — F3341 Major depressive disorder, recurrent, in partial remission: Secondary | ICD-10-CM | POA: Diagnosis not present

## 2023-11-07 DIAGNOSIS — F4310 Post-traumatic stress disorder, unspecified: Secondary | ICD-10-CM | POA: Diagnosis not present

## 2023-11-07 DIAGNOSIS — Z0000 Encounter for general adult medical examination without abnormal findings: Secondary | ICD-10-CM | POA: Diagnosis not present

## 2023-11-07 DIAGNOSIS — M797 Fibromyalgia: Secondary | ICD-10-CM | POA: Diagnosis not present

## 2023-11-07 DIAGNOSIS — E119 Type 2 diabetes mellitus without complications: Secondary | ICD-10-CM | POA: Diagnosis not present

## 2023-11-07 DIAGNOSIS — E7800 Pure hypercholesterolemia, unspecified: Secondary | ICD-10-CM | POA: Diagnosis not present

## 2023-11-07 DIAGNOSIS — C8590 Non-Hodgkin lymphoma, unspecified, unspecified site: Secondary | ICD-10-CM | POA: Diagnosis not present

## 2023-11-07 DIAGNOSIS — R42 Dizziness and giddiness: Secondary | ICD-10-CM | POA: Diagnosis not present

## 2023-11-07 DIAGNOSIS — N400 Enlarged prostate without lower urinary tract symptoms: Secondary | ICD-10-CM | POA: Diagnosis not present

## 2023-11-07 DIAGNOSIS — Z23 Encounter for immunization: Secondary | ICD-10-CM | POA: Diagnosis not present

## 2023-11-07 DIAGNOSIS — E291 Testicular hypofunction: Secondary | ICD-10-CM | POA: Diagnosis not present

## 2023-11-27 DIAGNOSIS — F322 Major depressive disorder, single episode, severe without psychotic features: Secondary | ICD-10-CM | POA: Diagnosis not present

## 2023-11-29 ENCOUNTER — Ambulatory Visit: Attending: Urology | Admitting: Physical Therapy

## 2023-11-29 DIAGNOSIS — M6281 Muscle weakness (generalized): Secondary | ICD-10-CM | POA: Diagnosis not present

## 2023-11-29 DIAGNOSIS — R278 Other lack of coordination: Secondary | ICD-10-CM | POA: Insufficient documentation

## 2023-11-29 DIAGNOSIS — R293 Abnormal posture: Secondary | ICD-10-CM | POA: Insufficient documentation

## 2023-11-29 NOTE — Therapy (Signed)
 OUTPATIENT PHYSICAL THERAPY MALE PELVIC TREATMENT   Patient Name: Eric Rice MRN: [id number] DOB:Mar 18, 1945, 78 y.o., male Today's Date: 11/29/2023  END OF SESSION:  PT End of Session - 11/29/23 1319     Visit Number 3    Date for Recertification  01/11/24    Authorization Type MEDICARE PART A AND B    Progress Note Due on Visit 10    PT Start Time 1230    PT Stop Time 1315    PT Time Calculation (min) 45 min    Activity Tolerance Patient tolerated treatment well;Patient limited by pain    Behavior During Therapy [facility] for tasks assessed/performed            Past Medical History:  Diagnosis Date   Arthritis    Benign localized prostatic hyperplasia with lower urinary tract symptoms (LUTS)    urologist--- dr pace   Bladder outlet obstruction    Cervical stenosis of spine    w/  spondylosis   ED (erectile dysfunction)    Fibromyalgia    Follicular lymphoma, unspecified body region, 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   followed by [facility] oncology-- dr rocky slain;   hx enlarged lymph nodes since 2013;  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, treatment active survillance every 6 months   History of chest pain    neg adenosine myoview in 2008   History of pulmonary embolus (PE) 11/2011   bilateral lower lobes and right upper lobe,  provoked long car travel,  completed 6 months xarelto   (12-06-2021  pt stated never had clots before or since 2013   History of skin cancer 03/04/2013   Middle Of Nose Atypical Trichilemmoma    Hyperlipidemia    Impingement of right shoulder    Primary hypogonadism in male    PTSD (post-traumatic stress disorder)    Past Surgical History:  Procedure Laterality Date   RHINOPLASTY     x2   last one 1970s   TONSILLECTOMY     child   TRANSURETHRAL RESECTION OF PROSTATE N/A 09/13/2021   Procedure: TRANSURETHRAL RESECTION OF THE PROSTATE (TURP);  Surgeon: Elisabeth Valli BIRCH, MD;  Location: [facility];  Service:  Urology;  Laterality: N/A;   TRANSURETHRAL RESECTION OF PROSTATE N/A 12/13/2021   Procedure: REPEAT TRANSURETHRAL RESECTION OF THE PROSTATE (TURP)/URETHRAL DILATION;  Surgeon: Elisabeth Valli BIRCH, MD;  Location: [facility];  Service: Urology;  Laterality: N/A;  45 MINS   Patient Active Problem List   Diagnosis Date Noted   Cerebral vascular disease 06/20/2022   BPH with obstruction/lower urinary tract symptoms 09/13/2021   Cervical spondylosis 11/02/2020   Bilateral shoulder pain 11/02/2020   Bilateral hip pain 07/31/2017   Fatigue 03/26/2017   Medicare annual wellness visit, subsequent 01/09/2017   DOE (dyspnea on exertion) 07/20/2015   History of pulmonary embolus (PE) 03/29/2015   Hyperlipidemia 01/08/2014   Coronary atherosclerosis of native coronary artery 01/05/2014   Abnormal ultrasound of carotid artery 08/15/2013   Polymyalgia 08/15/2013   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   Excess weight 08/04/2013   Diuresis excessive 08/04/2013   Blood pressure elevated without history of HTN 01/31/2013   Nocturia 11/19/2012   Fibromyalgia 11/19/2012   Low testosterone  05/29/2012   Acute pulmonary embolism (HCC) 11/30/2011   Long term use of drug 11/28/2010   Diverticulosis of colon 03/09/2010   Clinical depression 07/30/2008   Elevated prostate specific antigen (PSA) 07/30/2008   Neurosis, posttraumatic 07/30/2008    PCP:  Alben Therisa MATSU, PA PCP - General  REFERRING PROVIDER: Reissmann, Molly E, MD  REFERRING DIAG: pelvic pain  THERAPY DIAG:  Muscle weakness (generalized)  Abnormal posture  Other lack of coordination  Rationale for Evaluation and Treatment: Rehabilitation ONSET DATE: 2020  SUBJECTIVE:                                                                                                                                                                                           SUBJECTIVE STATEMENT: He has been managing his diabetes - he has been walking 3  miles per day outside and watching his sugar intake. His vertigo has a been whole lot better. Deep breathing exercises have been fine. No bowel or bladder related concerns to report.   From eval: Pain was going for a while, but came back 5 months ago.  Patient reports that he has been to 2 other pelvic PT in the past, last one 2 years ago. Did not help much.  None of the exercises did any good.  This is an issues that he has for 3-4 year- right groin pain. (Points to more in the middle) First therapist tried internal work.  Patient reports that he has constant pain.  Has had 2 TURP procedures, pain is 24/7.  Ruled out hernia a while back Used to take pain meds, stopped Urological issues  for 20 years When it gets real bad, he takes Aleve Has slow growing cancer in his abdomen- not treating it TURP made it worse- has to get up 2/ night  PAIN:  Are you having pain? Yes NPRS scale: 5-8/10 Pain location: Internal, Deep, Right, Anterior, and middle  Pain type: sharp Pain description: intermittent   Aggravating factors: nothing, just there 70% of time Relieving factors: nothing, moves around when it is bad  PRECAUTIONS: None  RED FLAGS: None   WEIGHT BEARING RESTRICTIONS: No  FALLS:  Has patient fallen in last 6 months? No  OCCUPATION: retired  ACTIVITY LEVEL : not very active- because of the pain partially- started to walk but groin muscles hurt.   PLOF: Independent  PATIENT GOALS:  reduced pain  PERTINENT HISTORY:  2 TURP procedure Sexual abuse: No  BOWEL MOVEMENT: no issues  URINATION: Pain with urination: No Fully empty bladder: No Stream: Strong normal Urgency: No Frequency: yes, 2x night - varies throughout the day Fluid Intake: water and milk Leakage: a little bit Pads: Yes: paper towels  INTERCOURSE: no issues  OBJECTIVE:  Note: Objective measures were completed at Evaluation unless otherwise noted.   PATIENT SURVEYS:   PFIQ-7: UIQ-7-  67  COGNITION: Overall cognitive status:  Within functional limits for tasks assessed     SENSATION: Light touch: Appears intact   FUNCTIONAL TESTS:  Squat: difficult Single leg stance: decreased balance bilat   Curl-up test: 1/4 with DRA with doming- 3 fingers throughout   GAIT: Assistive device utilized: None Comments: antalgic  POSTURE: rounded shoulders, forward head, decreased lumbar lordosis, decreased thoracic kyphosis, and flexed trunk    LUMBARAROM/PROM:  A/PROM A/PROM  Eval (% available)  Flexion 75%  Extension 75  Right lateral flexion 75  Left lateral flexion 75  Right rotation 75  Left rotation 75   (Blank rows = not tested)  PALPATION:   General: gentle palpation - very tender suprapubic area - reproduced pain patient is coming with  Pelvic Alignment: even  Abdominal: large diastasis rectus                External Perineal Exam: within functional limitations                              Internal Pelvic Floor: unable to push therapist's finger out  Patient confirms identification and approves PT to assess internal pelvic floor and treatment Yes  PELVIC MMT:   MMT eval     Internal Anal Sphincter 4/5  External Anal Sphincter 4/5  Puborectalis   Diastasis Recti Yes- 3 fingers throughout with doming with any transfer  (Blank rows = not tested)        TONE: average   TODAY'S TREATMENT:                                                                                                                              DATE:  10/11/2023 EVAL  Manual: abdominal work around bladder- very irritable area- more medially and on right    Exercises: half kneeling stretch bilateral, horizontal abduction abduction with theraband  with transverse abdominis breath   Therapeutic activities: Pt was educated on relevant anatomy, exam findings, home exercise program, plan of care, expectations of PT    10/23/23:  Neuro re-ed: Hooklying diaphragmatic breathing +  pelvic floor lengthening with inhalation + shortening with exhalation 2x10  Supine hip flexor stretch with leg off side of bed/couch + diaphragmatic breathing 2x70min  Manual therapy: Internal rectal examination + assessment of pelvic floor musculature  Internal cueing for lengthening of rectum during inhalation and shortening during exhalation  Self care:  Relative anatomy, connection between the diaphragm and pelvic floor, how straining affects suprapubic/abdominal discomfort due to pressure changes   11/29/23: Seated ball press + transverse abdominis contraction on exhale 2x10 (cueing to pull the pelvic floor and core up and in while blowing out) Standing hip flexor stretch with foot on 2nd step + diaphragmatic breathing 2x10  Standing hip extension with diaphragmatic breathing focusing on stretching the hip flexors 2x10  Manual to bilateral hip flexors/groin with cup and sustained pressure techniques to promote blood flow and decrease  tension Pubic bone cupping + mobilization of joint to decrease pubic bone pain 2x20   PATIENT EDUCATION:  Education details: See above Person educated: Patient Education method: Explanation, Demonstration, Tactile cues, Verbal cues, and Handouts Education comprehension: verbalized understanding  HOME EXERCISE PROGRAM: Access Code: [email or web address] URL: [email or web address] Date: 11/29/2023 Prepared by: Celena Domino  Exercises - Standing Lumbar Extension  - 1 x daily - 7 x weekly - 2 sets - 10 reps - Seated Abdominal Press into [employer]  - 1 x daily - 7 x weekly - 2 sets - 10 reps - Hip Flexor Stretch on Step  - 1 x daily - 7 x weekly - 2 sets - 10 reps  ASSESSMENT:  CLINICAL IMPRESSION: Patient is a 78 y.o. M who was seen today for physical therapy treatment for lower abdominal/ pelvic pain. He has had this pain for about 4-5 years and has seen 2 pelvic PTs without success. He reports baseline 2/10 pain today in the suprapubic region. Manual  to pubic symphysis and bilateral hip flexors was slightly tender to pt and he felt like this was his reciprocal pain he feels daily. Cupping was relieving to pubic pain. Hip flexor stretching added to HEP due to overactivity in bilateral hip flexors. Overall, pt tolerated session well and Pt would benefit from additional PT to further address deficits.    OBJECTIVE IMPAIRMENTS: decreased activity tolerance, decreased coordination, decreased endurance, decreased mobility, decreased ROM, decreased strength, increased fascial restrictions, increased muscle spasms, impaired flexibility, impaired tone, improper body mechanics, postural dysfunction, and pain.   ACTIVITY LIMITATIONS: standing, continence, toileting, and locomotion level  PARTICIPATION LIMITATIONS: community activity  PERSONAL FACTORS: Age, Behavior pattern, and Time since onset of injury/illness/exacerbation are also affecting patient's functional outcome.   REHAB POTENTIAL: Good  CLINICAL DECISION MAKING: Evolving/moderate complexity  EVALUATION COMPLEXITY: Moderate   GOALS: Goals reviewed with patient? Yes  SHORT TERM GOALS: Target date: 11/08/2023    Pt will be independent with HEP.   Baseline: Goal status: INITIAL  2.  Patient will report reduced pelvic pain to max 3/10 with suprapubic  palpation Baseline:  Goal status: INITIAL    LONG TERM GOALS: Target date: 01/03/2024    Pt will be independent with advanced HEP.   Baseline:  Goal status: INITIAL  2.  Patient will be able to walk at least 1 mile without increased groin pain  Baseline:  Goal status: INITIAL  3.  Patient will report max 2/10 pelvic pain in a 24 hour period Baseline:  Goal status: INITIAL  4.  Patient will get up maximum 1 time/ night to urinate Baseline: 2 Goal status: INITIAL  5.  Patient will have improved bilateral lower extremity strength to 5/5  Baseline:  Goal status: INITIAL  6.  Patient will demonstrate improved abdominal  strength to 4/5 at least with reduced diastasis doming to improve ease of transfers Baseline:  Goal status: INITIAL  PLAN:  PT FREQUENCY: 1-2x/week  PT DURATION: 12 weeks   PLANNED INTERVENTIONS: [id number]- PT Re-evaluation, 97110-Therapeutic exercises, 97530- Therapeutic activity, 97112- Neuromuscular re-education, 97535- Self Care, [id number]- Manual therapy, [phone]- Gait training, [phone]- Aquatic Therapy, [phone]- Electrical stimulation (unattended), [phone]- Traction (mechanical), [id number]- Ionotophoresis 4mg /ml Dexamethasone , 79439 (1-2 muscles), [id number] (3+ muscles)- Dry Needling, Patient/Family education, Balance training, Taping, Joint mobilization, Joint manipulation, Spinal manipulation, Spinal mobilization, Scar mobilization, Vestibular training, Cryotherapy, Moist heat, and Biofeedback  PLAN FOR NEXT SESSION: cupping over subrapubic region on the right with diaphragmatic breathing    Celena JAYSON Domino, PT  11/29/2023, 1:19 PM

## 2023-12-11 ENCOUNTER — Ambulatory Visit: Admitting: Physical Therapy

## 2023-12-11 DIAGNOSIS — R278 Other lack of coordination: Secondary | ICD-10-CM | POA: Diagnosis not present

## 2023-12-11 DIAGNOSIS — M6281 Muscle weakness (generalized): Secondary | ICD-10-CM

## 2023-12-11 DIAGNOSIS — R293 Abnormal posture: Secondary | ICD-10-CM | POA: Diagnosis not present

## 2023-12-11 NOTE — Therapy (Signed)
 OUTPATIENT PHYSICAL THERAPY MALE PELVIC TREATMENT   Patient Name: Eric Rice MRN: [id number] DOB:08-25-1945, 78 y.o., male Today's Date: 12/11/2023  END OF SESSION:  PT End of Session - 12/11/23 1226     Visit Number 4    Date for Recertification  01/11/24    Authorization Type MEDICARE PART A AND B    Progress Note Due on Visit 10    PT Start Time 1145    PT Stop Time 1226    PT Time Calculation (min) 41 min    Activity Tolerance Patient tolerated treatment well;Patient limited by pain    Behavior During Therapy WFL for tasks assessed/performed             Past Medical History:  Diagnosis Date   Arthritis    Benign localized prostatic hyperplasia with lower urinary tract symptoms (LUTS)    urologist--- dr pace   Bladder outlet obstruction    Cervical stenosis of spine    w/  spondylosis   ED (erectile dysfunction)    Fibromyalgia    Follicular lymphoma, unspecified body region, 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   followed by [facility] oncology-- dr rocky slain;   hx enlarged lymph nodes since 2013;  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, treatment active survillance every 6 months   History of chest pain    neg adenosine myoview in 2008   History of pulmonary embolus (PE) 11/2011   bilateral lower lobes and right upper lobe,  provoked long car travel,  completed 6 months xarelto   (12-06-2021  pt stated never had clots before or since 2013   History of skin cancer 03/04/2013   Middle Of Nose Atypical Trichilemmoma    Hyperlipidemia    Impingement of right shoulder    Primary hypogonadism in male    PTSD (post-traumatic stress disorder)    Past Surgical History:  Procedure Laterality Date   RHINOPLASTY     x2   last one 1970s   TONSILLECTOMY     child   TRANSURETHRAL RESECTION OF PROSTATE N/A 09/13/2021   Procedure: TRANSURETHRAL RESECTION OF THE PROSTATE (TURP);  Surgeon: Elisabeth Valli BIRCH, MD;  Location: [facility];  Service:  Urology;  Laterality: N/A;   TRANSURETHRAL RESECTION OF PROSTATE N/A 12/13/2021   Procedure: REPEAT TRANSURETHRAL RESECTION OF THE PROSTATE (TURP)/URETHRAL DILATION;  Surgeon: Elisabeth Valli BIRCH, MD;  Location: [facility] ;  Service: Urology;  Laterality: N/A;  45 MINS   Patient Active Problem List   Diagnosis Date Noted   Cerebral vascular disease 06/20/2022   BPH with obstruction/lower urinary tract symptoms 09/13/2021   Cervical spondylosis 11/02/2020   Bilateral shoulder pain 11/02/2020   Bilateral hip pain 07/31/2017   Fatigue 03/26/2017   Medicare annual wellness visit, subsequent 01/09/2017   DOE (dyspnea on exertion) 07/20/2015   History of pulmonary embolus (PE) 03/29/2015   Hyperlipidemia 01/08/2014   Coronary atherosclerosis of native coronary artery 01/05/2014   Abnormal ultrasound of carotid artery 08/15/2013   Polymyalgia 08/15/2013   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   Excess weight 08/04/2013   Diuresis excessive 08/04/2013   Blood pressure elevated without history of HTN 01/31/2013   Nocturia 11/19/2012   Fibromyalgia 11/19/2012   Low testosterone  05/29/2012   Acute pulmonary embolism (HCC) 11/30/2011   Long term use of drug 11/28/2010   Diverticulosis of colon 03/09/2010   Clinical depression 07/30/2008   Elevated prostate specific antigen (PSA) 07/30/2008   Neurosis, posttraumatic 07/30/2008  PCP: Alben Therisa MATSU, PA PCP - General  REFERRING PROVIDER: Lyell Geofm BRAVO, MD  REFERRING DIAG: pelvic pain  THERAPY DIAG:  Muscle weakness (generalized)  Abnormal posture  Other lack of coordination  Rationale for Evaluation and Treatment: Rehabilitation ONSET DATE: 2020  SUBJECTIVE:                                                                                                                                                                                           SUBJECTIVE STATEMENT: He is doing well today. He has been walking 2 miles per day  recently - 3 miles was too much and making his hips very sore and tight. His vertigo has a been whole lot better - only 1 instance in the last week. Pain hasn't been improved recently - 5/10 at rest today in the right sided of the suprapubic region.  From eval: Pain was going for a while, but came back 5 months ago.  Patient reports that he has been to 2 other pelvic PT in the past, last one 2 years ago. Did not help much.  None of the exercises did any good.  This is an issues that he has for 3-4 year- right groin pain. (Points to more in the middle) First therapist tried internal work.  Patient reports that he has constant pain.  Has had 2 TURP procedures, pain is 24/7.  Ruled out hernia a while back Used to take pain meds, stopped Urological issues  for 20 years When it gets real bad, he takes Aleve Has slow growing cancer in his abdomen- not treating it TURP made it worse- has to get up 2/ night  PAIN:  Are you having pain? Yes NPRS scale: 5-8/10 Pain location: Internal, Deep, Right, Anterior, and middle  Pain type: sharp Pain description: intermittent   Aggravating factors: nothing, just there 70% of time Relieving factors: nothing, moves around when it is bad  PRECAUTIONS: None  RED FLAGS: None   WEIGHT BEARING RESTRICTIONS: No  FALLS:  Has patient fallen in last 6 months? No  OCCUPATION: retired  ACTIVITY LEVEL : not very active- because of the pain partially- started to walk but groin muscles hurt.   PLOF: Independent  PATIENT GOALS:  reduced pain  PERTINENT HISTORY:  2 TURP procedure Sexual abuse: No  BOWEL MOVEMENT: no issues  URINATION: Pain with urination: No Fully empty bladder: No Stream: Strong normal Urgency: No Frequency: yes, 2x night - varies throughout the day Fluid Intake: water and milk Leakage: a little bit Pads: Yes: paper towels  INTERCOURSE: no issues  OBJECTIVE:  Note: Objective measures were completed at  Evaluation unless  otherwise noted.   PATIENT SURVEYS:   PFIQ-7: UIQ-7- 67  COGNITION: Overall cognitive status: Within functional limits for tasks assessed     SENSATION: Light touch: Appears intact   FUNCTIONAL TESTS:  Squat: difficult Single leg stance: decreased balance bilat   Curl-up test: 1/4 with DRA with doming- 3 fingers throughout   GAIT: Assistive device utilized: None Comments: antalgic  POSTURE: rounded shoulders, forward head, decreased lumbar lordosis, decreased thoracic kyphosis, and flexed trunk    LUMBARAROM/PROM:  A/PROM A/PROM  Eval (% available)  Flexion 75%  Extension 75  Right lateral flexion 75  Left lateral flexion 75  Right rotation 75  Left rotation 75   (Blank rows = not tested)  PALPATION:   General: gentle palpation - very tender suprapubic area - reproduced pain patient is coming with  Pelvic Alignment: even  Abdominal: large diastasis rectus                External Perineal Exam: within functional limitations                              Internal Pelvic Floor: unable to push therapist's finger out  Patient confirms identification and approves PT to assess internal pelvic floor and treatment Yes  PELVIC MMT:   MMT eval     Internal Anal Sphincter 4/5  External Anal Sphincter 4/5  Puborectalis   Diastasis Recti Yes- 3 fingers throughout with doming with any transfer  (Blank rows = not tested)        TONE: average   TODAY'S TREATMENT:                                                                                                                              DATE:  10/11/2023 EVAL  Manual: abdominal work around bladder- very irritable area- more medially and on right Exercises: half kneeling stretch bilateral, horizontal abduction abduction with theraband  with transverse abdominis breath  Therapeutic activities: Pt was educated on relevant anatomy, exam findings, home exercise program, plan of care, expectations of PT    10/23/23:   Neuro re-ed: Hooklying diaphragmatic breathing + pelvic floor lengthening with inhalation + shortening with exhalation 2x10  Supine hip flexor stretch with leg off side of bed/couch + diaphragmatic breathing 2x66min  Manual therapy: Internal rectal examination + assessment of pelvic floor musculature  Internal cueing for lengthening of rectum during inhalation and shortening during exhalation  Self care:  Relative anatomy, connection between the diaphragm and pelvic floor, how straining affects suprapubic/abdominal discomfort due to pressure changes   11/29/23: Seated ball press + transverse abdominis contraction on exhale 2x10 (cueing to pull the pelvic floor and core up and in while blowing out) Standing hip flexor stretch with foot on 2nd step + diaphragmatic breathing 2x10  Standing hip extension with diaphragmatic breathing focusing on stretching the hip flexors 2x10  Manual to  bilateral hip flexors/groin with cup and sustained pressure techniques to promote blood flow and decrease tension Pubic bone cupping + mobilization of joint to decrease pubic bone pain 2x20   12/11/23: Seated ball press + transverse abdominis contraction on exhale 2x10 (cueing to pull the pelvic floor and core up and in while blowing out) Single knee to chest stretch + diaphragmatic breathing 2x25min  Lower trunk rotations + diaphragmatic breathing 2x25min  Supine butterfly stretch + diaphragmatic breathing 2x80min   Seated piriformis stretch + diaphragmatic breathing 2x79min  Manual to bilateral hip flexors/groin with cup and sustained pressure techniques to promote blood flow and decrease tension Pubic bone cupping + mobilization of joint to decrease pubic bone pain 2x20  Inguinal ligament mobilization to promote blood flow to decrease tension around this area  Blow as you go technique to decrease intraabdominal/pelvic pressure with transfers   PATIENT EDUCATION:  Education details: See above Person educated:  Patient Education method: Explanation, Demonstration, Tactile cues, Verbal cues, and Handouts Education comprehension: verbalized understanding  HOME EXERCISE PROGRAM: Access Code: [email or web address] URL: [email or web address] Date: 12/11/2023 Prepared by: Celena Domino  Exercises - Standing Lumbar Extension  - 1 x daily - 7 x weekly - 2 sets - 10 reps - Seated Abdominal Press into [employer]  - 1 x daily - 7 x weekly - 2 sets - 10 reps - Hip Flexor Stretch on Step  - 1 x daily - 7 x weekly - 2 sets - 10 reps - Seated Piriformis Stretch  - 1 x daily - 7 x weekly - 2 sets - hold - Supported Teacher, music with Pelvic Floor Relaxation  - 1 x daily - 7 x weekly - 2 sets - hold - Supine Single Knee to Chest Stretch  - 1 x daily - 7 x weekly - 2 sets - hold  ASSESSMENT:  CLINICAL IMPRESSION: Patient is a 78 y.o. M who was seen today for physical therapy treatment for lower abdominal/ pelvic pain. He has had this pain for about 4-5 years and has seen 2 pelvic PTs without success. Manual therapy reproduced patients pain which is located primarily in the right inguinal region and around the pubic symphysis. Direct palpation was painful for the patient and this decreased slightly with manual interventions and deep diaphragmatic breathing practice. Progressive hip and lumbar stretching introduced and paired with diaphragmatic breathing to relax the pelvic floor as much as possible due to recent increase in walking mileage. Overall, pt tolerated session well and Pt would benefit from additional PT to further address deficits.    OBJECTIVE IMPAIRMENTS: decreased activity tolerance, decreased coordination, decreased endurance, decreased mobility, decreased ROM, decreased strength, increased fascial restrictions, increased muscle spasms, impaired flexibility, impaired tone, improper body mechanics, postural dysfunction, and pain.   ACTIVITY LIMITATIONS: standing, continence, toileting,  and locomotion level  PARTICIPATION LIMITATIONS: community activity  PERSONAL FACTORS: Age, Behavior pattern, and Time since onset of injury/illness/exacerbation are also affecting patient's functional outcome.   REHAB POTENTIAL: Good  CLINICAL DECISION MAKING: Evolving/moderate complexity  EVALUATION COMPLEXITY: Moderate   GOALS: Goals reviewed with patient? Yes  SHORT TERM GOALS: Target date: 11/08/2023    Pt will be independent with HEP.   Baseline: Goal status: INITIAL  2.  Patient will report reduced pelvic pain to max 3/10 with suprapubic  palpation Baseline:  Goal status: INITIAL    LONG TERM GOALS: Target date: 01/03/2024    Pt will be independent with advanced HEP.   Baseline:  Goal status:  INITIAL  2.  Patient will be able to walk at least 1 mile without increased groin pain  Baseline:  Goal status: INITIAL  3.  Patient will report max 2/10 pelvic pain in a 24 hour period Baseline:  Goal status: INITIAL  4.  Patient will get up maximum 1 time/ night to urinate Baseline: 2 Goal status: INITIAL  5.  Patient will have improved bilateral lower extremity strength to 5/5  Baseline:  Goal status: INITIAL  6.  Patient will demonstrate improved abdominal strength to 4/5 at least with reduced diastasis doming to improve ease of transfers Baseline:  Goal status: INITIAL  PLAN:  PT FREQUENCY: 1-2x/week  PT DURATION: 12 weeks   PLANNED INTERVENTIONS: [id number]- PT Re-evaluation, 97110-Therapeutic exercises, 97530- Therapeutic activity, 97112- Neuromuscular re-education, 97535- Self Care, [id number]- Manual therapy, [phone]- Gait training, [phone]- Aquatic Therapy, [phone]- Electrical stimulation (unattended), [phone]- Traction (mechanical), [id number]- Ionotophoresis 4mg /ml Dexamethasone , [id number] (1-2 muscles), [id number] (3+ muscles)- Dry Needling, Patient/Family education, Balance training, Taping, Joint mobilization, Joint manipulation, Spinal manipulation, Spinal mobilization, Scar  mobilization, Vestibular training, Cryotherapy, Moist heat, and Biofeedback  PLAN FOR NEXT SESSION: cupping over subrapubic region on the right with diaphragmatic breathing    Celena JAYSON Domino, PT 12/11/2023, 12:27 PM

## 2023-12-17 ENCOUNTER — Encounter: Payer: Self-pay | Admitting: Physical Therapy

## 2023-12-17 ENCOUNTER — Ambulatory Visit: Admitting: Physical Therapy

## 2023-12-17 DIAGNOSIS — M6281 Muscle weakness (generalized): Secondary | ICD-10-CM | POA: Diagnosis not present

## 2023-12-17 DIAGNOSIS — R278 Other lack of coordination: Secondary | ICD-10-CM

## 2023-12-17 DIAGNOSIS — R293 Abnormal posture: Secondary | ICD-10-CM

## 2023-12-17 NOTE — Therapy (Signed)
 OUTPATIENT PHYSICAL THERAPY MALE PELVIC TREATMENT   Patient Name: Eric Rice MRN: [id number] DOB:1945/03/14, 78 y.o., male Today's Date: 12/17/2023  END OF SESSION:  PT End of Session - 12/17/23 1233     Visit Number 5    Date for Recertification  01/11/24    Authorization Type MEDICARE PART A AND B    Authorization - Number of Visits 5    Progress Note Due on Visit 10    PT Start Time 1230    PT Stop Time 1310    PT Time Calculation (min) 40 min    Activity Tolerance Patient tolerated treatment well;Patient limited by pain    Behavior During Therapy WFL for tasks assessed/performed             Past Medical History:  Diagnosis Date   Arthritis    Benign localized prostatic hyperplasia with lower urinary tract symptoms (LUTS)    urologist--- dr pace   Bladder outlet obstruction    Cervical stenosis of spine    w/  spondylosis   ED (erectile dysfunction)    Fibromyalgia    Follicular lymphoma, unspecified body region, 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   followed by [facility] oncology-- dr rocky slain;   hx enlarged lymph nodes since 2013;  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, treatment active survillance every 6 months   History of chest pain    neg adenosine myoview in 2008   History of pulmonary embolus (PE) 11/2011   bilateral lower lobes and right upper lobe,  provoked long car travel,  completed 6 months xarelto   (12-06-2021  pt stated never had clots before or since 2013   History of skin cancer 03/04/2013   Middle Of Nose Atypical Trichilemmoma    Hyperlipidemia    Impingement of right shoulder    Primary hypogonadism in male    PTSD (post-traumatic stress disorder)    Past Surgical History:  Procedure Laterality Date   RHINOPLASTY     x2   last one 1970s   TONSILLECTOMY     child   TRANSURETHRAL RESECTION OF PROSTATE N/A 09/13/2021   Procedure: TRANSURETHRAL RESECTION OF THE PROSTATE (TURP);  Surgeon: Elisabeth Valli BIRCH, MD;   Location: [facility];  Service: Urology;  Laterality: N/A;   TRANSURETHRAL RESECTION OF PROSTATE N/A 12/13/2021   Procedure: REPEAT TRANSURETHRAL RESECTION OF THE PROSTATE (TURP)/URETHRAL DILATION;  Surgeon: Elisabeth Valli BIRCH, MD;  Location: [facility];  Service: Urology;  Laterality: N/A;  45 MINS   Patient Active Problem List   Diagnosis Date Noted   Cerebral vascular disease 06/20/2022   BPH with obstruction/lower urinary tract symptoms 09/13/2021   Cervical spondylosis 11/02/2020   Bilateral shoulder pain 11/02/2020   Bilateral hip pain 07/31/2017   Fatigue 03/26/2017   Medicare annual wellness visit, subsequent 01/09/2017   DOE (dyspnea on exertion) 07/20/2015   History of pulmonary embolus (PE) 03/29/2015   Hyperlipidemia 01/08/2014   Coronary atherosclerosis of native coronary artery 01/05/2014   Abnormal ultrasound of carotid artery 08/15/2013   Polymyalgia 08/15/2013   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   Excess weight 08/04/2013   Diuresis excessive 08/04/2013   Blood pressure elevated without history of HTN 01/31/2013   Nocturia 11/19/2012   Fibromyalgia 11/19/2012   Low testosterone  05/29/2012   Acute pulmonary embolism (HCC) 11/30/2011   Long term use of drug 11/28/2010   Diverticulosis of colon 03/09/2010   Clinical depression 07/30/2008   Elevated prostate specific antigen (PSA)  07/30/2008   Neurosis, posttraumatic 07/30/2008    PCP: Alben Therisa MATSU, PA PCP - General  REFERRING PROVIDER: Lyell Geofm BRAVO, MD  REFERRING DIAG: pelvic pain  THERAPY DIAG:  Muscle weakness (generalized)  Abnormal posture  Other lack of coordination  Rationale for Evaluation and Treatment: Rehabilitation ONSET DATE: 2020  SUBJECTIVE:                                                                                                                                                                                           SUBJECTIVE STATEMENT: I have had  trouble with erectile dysfunction and tried all the treatments. Pain level varies throughout the day. Cupping helped while doing the techniques but not after.   From eval: Pain was going for a while, but came back 5 months ago.  Patient reports that he has been to 2 other pelvic PT in the past, last one 2 years ago. Did not help much.  None of the exercises did any good.  This is an issues that he has for 3-4 year- right groin pain. (Points to more in the middle) First therapist tried internal work.  Patient reports that he has constant pain.  Has had 2 TURP procedures, pain is 24/7.  Ruled out hernia a while back Used to take pain meds, stopped Urological issues  for 20 years When it gets real bad, he takes Aleve Has slow growing cancer in his abdomen- not treating it TURP made it worse- has to get up 2/ night  PAIN:  Are you having pain? Yes NPRS scale: 5-8/10 Pain location: Internal, Deep, Right, Anterior, and middle  Pain type: sharp Pain description: intermittent   Aggravating factors: nothing, just there 70% of time Relieving factors: nothing, moves around when it is bad  PRECAUTIONS: None  RED FLAGS: None   WEIGHT BEARING RESTRICTIONS: No  FALLS:  Has patient fallen in last 6 months? No  OCCUPATION: retired  ACTIVITY LEVEL : not very active- because of the pain partially- started to walk but groin muscles hurt.   PLOF: Independent  PATIENT GOALS:  reduced pain  PERTINENT HISTORY:  2 TURP procedure Sexual abuse: No  BOWEL MOVEMENT: no issues  URINATION: Pain with urination: No Fully empty bladder: No Stream: Strong normal Urgency: No Frequency: yes, 2x night - varies throughout the day Fluid Intake: water and milk Leakage: a little bit Pads: Yes: paper towels  INTERCOURSE: no issues  OBJECTIVE:  Note: Objective measures were completed at Evaluation unless otherwise noted.   PATIENT SURVEYS:   PFIQ-7: UIQ-7- 67  COGNITION: Overall cognitive  status: Within functional limits for tasks assessed  SENSATION: Light touch: Appears intact   FUNCTIONAL TESTS:  Squat: difficult Single leg stance: decreased balance bilat   Curl-up test: 1/4 with DRA with doming- 3 fingers throughout   GAIT: Assistive device utilized: None Comments: antalgic  POSTURE: rounded shoulders, forward head, decreased lumbar lordosis, decreased thoracic kyphosis, and flexed trunk    LUMBARAROM/PROM:  A/PROM A/PROM  Eval (% available)  Flexion 75%  Extension 75  Right lateral flexion 75  Left lateral flexion 75  Right rotation 75  Left rotation 75   (Blank rows = not tested)  PALPATION:   General: gentle palpation - very tender suprapubic area - reproduced pain patient is coming with  Pelvic Alignment: even  Abdominal: large diastasis rectus                External Perineal Exam: within functional limitations                              Internal Pelvic Floor: unable to push therapist's finger out  Patient confirms identification and approves PT to assess internal pelvic floor and treatment Yes  PELVIC MMT:   MMT eval 12/17/23  Internal Anal Sphincter 4/5 4/5  External Anal Sphincter 4/5 4/5  Puborectalis  4/5  Diastasis Recti Yes- 3 fingers throughout with doming with any transfer   (Blank rows = not tested)        TONE: average   TODAY'S TREATMENT:                                                                                                                              DATE:  10/11/2023 EVAL  Manual: abdominal work around bladder- very irritable area- more medially and on right Exercises: half kneeling stretch bilateral, horizontal abduction abduction with theraband  with transverse abdominis breath  Therapeutic activities: Pt was educated on relevant anatomy, exam findings, home exercise program, plan of care, expectations of PT    10/23/23:  Neuro re-ed: Hooklying diaphragmatic breathing + pelvic floor lengthening  with inhalation + shortening with exhalation 2x10  Supine hip flexor stretch with leg off side of bed/couch + diaphragmatic breathing 2x56min  Manual therapy: Internal rectal examination + assessment of pelvic floor musculature  Internal cueing for lengthening of rectum during inhalation and shortening during exhalation  Self care:  Relative anatomy, connection between the diaphragm and pelvic floor, how straining affects suprapubic/abdominal discomfort due to pressure changes   11/29/23: Seated ball press + transverse abdominis contraction on exhale 2x10 (cueing to pull the pelvic floor and core up and in while blowing out) Standing hip flexor stretch with foot on 2nd step + diaphragmatic breathing 2x10  Standing hip extension with diaphragmatic breathing focusing on stretching the hip flexors 2x10  Manual to bilateral hip flexors/groin with cup and sustained pressure techniques to promote blood flow and decrease tension Pubic bone cupping + mobilization of joint to decrease pubic bone  pain 2x20   12/11/23: Seated ball press + transverse abdominis contraction on exhale 2x10 (cueing to pull the pelvic floor and core up and in while blowing out) Single knee to chest stretch + diaphragmatic breathing 2x53min  Lower trunk rotations + diaphragmatic breathing 2x88min  Supine butterfly stretch + diaphragmatic breathing 2x64min   Seated piriformis stretch + diaphragmatic breathing 2x69min  Manual to bilateral hip flexors/groin with cup and sustained pressure techniques to promote blood flow and decrease tension Pubic bone cupping + mobilization of joint to decrease pubic bone pain 2x20  Inguinal ligament mobilization to promote blood flow to decrease tension around this area  Blow as you go technique to decrease intraabdominal/pelvic pressure with transfers   12/17/23 Manual: Myofascial release: Fascial release around the ischiocavernosus and along the inferior portion of the symphysis pubic bone  area Spinal mobilization: PA and rotational mobilization to L1-5 grade 3 Mobilization to SI joint grade 3  Internal pelvic floor techniques: No emotional/communication barriers or cognitive limitation. Patient is motivated to learn. Patient understands and agrees with treatment goals and plan. PT explains patient will be examined in standing, sitting, and lying down to see how their muscles and joints work. When they are ready, they will be asked to remove their underwear so PT can examine their perineum. The patient is also given the option of providing their own chaperone as one is not provided in our facility. The patient also has the right and is explained the right to defer or refuse any part of the evaluation or treatment including the internal exam. With the patient's consent, PT will use one gloved finger to gently assess the muscles of the pelvic floor, seeing how well it contracts and relaxes and if there is muscle symmetry. After, the patient will get dressed and PT and patient will discuss exam findings and plan of care. PT and patient discuss plan of care, schedule, attendance policy and HEP activities.  Therapist going through the rectum working on the anococcygeal ligament, levator ani, obturator internist, along the sides of the prostate, along the sides of the urethra while gently pulling on the shaft of the penis to elongate the muscles and fascia Exercises: Stretches/mobility: Prone press up 10 x with overpressure Prone hip internal rotation     PATIENT EDUCATION:  Education details: See above Person educated: Patient Education method: Explanation, Demonstration, Tactile cues, Verbal cues, and Handouts Education comprehension: verbalized understanding  HOME EXERCISE PROGRAM: Access Code: [email or web address] URL: [email or web address] Date: 12/11/2023 Prepared by: Celena Domino  Exercises - Standing Lumbar Extension  - 1 x daily - 7 x weekly - 2 sets - 10 reps - Seated  Abdominal Press into [employer]  - 1 x daily - 7 x weekly - 2 sets - 10 reps - Hip Flexor Stretch on Step  - 1 x daily - 7 x weekly - 2 sets - 10 reps - Seated Piriformis Stretch  - 1 x daily - 7 x weekly - 2 sets - hold - Supported Teacher, Music with Pelvic Floor Relaxation  - 1 x daily - 7 x weekly - 2 sets - hold - Supine Single Knee to Chest Stretch  - 1 x daily - 7 x weekly - 2 sets - hold  ASSESSMENT:  CLINICAL IMPRESSION: Patient is a 78 y.o. M who was seen today for physical therapy treatment for lower abdominal/ pelvic pain. He has had this pain for about 4-5 years and has seen 2 pelvic PTs without success.  Patient is able to stop the flow of his urine. Patient pelvic floor strength is 4/5. PA mobilization of L4 and L5 reduces the pain. Patient has tightness of the SI joint and lumbar vertebrae. He is having issues with erectile dysfunction. He continues to have the pain. He has one more visit to see if he has made progress.  Overall, pt tolerated session well and Pt would benefit from additional PT to further address deficits.    OBJECTIVE IMPAIRMENTS: decreased activity tolerance, decreased coordination, decreased endurance, decreased mobility, decreased ROM, decreased strength, increased fascial restrictions, increased muscle spasms, impaired flexibility, impaired tone, improper body mechanics, postural dysfunction, and pain.   ACTIVITY LIMITATIONS: standing, continence, toileting, and locomotion level  PARTICIPATION LIMITATIONS: community activity  PERSONAL FACTORS: Age, Behavior pattern, and Time since onset of injury/illness/exacerbation are also affecting patient's functional outcome.   REHAB POTENTIAL: Good  CLINICAL DECISION MAKING: Evolving/moderate complexity  EVALUATION COMPLEXITY: Moderate   GOALS: Goals reviewed with patient? Yes  SHORT TERM GOALS: Target date: 11/08/2023    Pt will be independent with HEP.   Baseline: Goal status:  INITIAL  2.  Patient will report reduced pelvic pain to max 3/10 with suprapubic  palpation Baseline:  Goal status: INITIAL    LONG TERM GOALS: Target date: 01/03/2024    Pt will be independent with advanced HEP.   Baseline:  Goal status: INITIAL  2.  Patient will be able to walk at least 1 mile without increased groin pain  Baseline:  Goal status: INITIAL  3.  Patient will report max 2/10 pelvic pain in a 24 hour period Baseline:  Goal status: INITIAL  4.  Patient will get up maximum 1 time/ night to urinate Baseline: 2 Goal status: INITIAL  5.  Patient will have improved bilateral lower extremity strength to 5/5  Baseline:  Goal status: INITIAL  6.  Patient will demonstrate improved abdominal strength to 4/5 at least with reduced diastasis doming to improve ease of transfers Baseline:  Goal status: INITIAL  PLAN:  PT FREQUENCY: 1-2x/week  PT DURATION: 12 weeks   PLANNED INTERVENTIONS: [id number]- PT Re-evaluation, 97110-Therapeutic exercises, 97530- Therapeutic activity, 97112- Neuromuscular re-education, 97535- Self Care, [id number]- Manual therapy, [phone]- Gait training, [phone]- Aquatic Therapy, [phone]- Electrical stimulation (unattended), [phone]- Traction (mechanical), [id number]- Ionotophoresis 4mg /ml Dexamethasone , 79439 (1-2 muscles), [id number] (3+ muscles)- Dry Needling, Patient/Family education, Balance training, Taping, Joint mobilization, Joint manipulation, Spinal manipulation, Spinal mobilization, Scar mobilization, Vestibular training, Cryotherapy, Moist heat, and Biofeedback  PLAN FOR NEXT SESSION: Decide if patient is to continue with therapy. Work on right suprapubic pain, cupping over subrapubic region on the right with diaphragmatic breathing     Channing Pereyra, PT 12/17/23 1:26 PM

## 2023-12-24 ENCOUNTER — Encounter: Payer: Self-pay | Admitting: Physical Therapy

## 2023-12-24 ENCOUNTER — Ambulatory Visit: Attending: Urology | Admitting: Physical Therapy

## 2023-12-24 DIAGNOSIS — R293 Abnormal posture: Secondary | ICD-10-CM | POA: Diagnosis present

## 2023-12-24 DIAGNOSIS — M6281 Muscle weakness (generalized): Secondary | ICD-10-CM | POA: Insufficient documentation

## 2023-12-24 DIAGNOSIS — R278 Other lack of coordination: Secondary | ICD-10-CM | POA: Insufficient documentation

## 2023-12-24 NOTE — Therapy (Signed)
 OUTPATIENT PHYSICAL THERAPY MALE PELVIC TREATMENT   Patient Name: Eric Rice MRN: [id number] DOB:[birth year], 78 y.o., male Today's Date: 12/24/2023  END OF SESSION:  PT End of Session - 12/24/23 1233     Visit Number 6    Date for Recertification  01/11/24    Authorization Type MEDICARE PART A AND B    Authorization - Number of Visits 6    Progress Note Due on Visit 10    PT Start Time 1230    PT Stop Time 1245    PT Time Calculation (min) 15 min    Activity Tolerance Patient tolerated treatment well;Patient limited by pain    Behavior During Therapy WFL for tasks assessed/performed             Past Medical History:  Diagnosis Date   Arthritis    Benign localized prostatic hyperplasia with lower urinary tract symptoms (LUTS)    urologist--- dr pace   Bladder outlet obstruction    Cervical stenosis of spine    w/  spondylosis   ED (erectile dysfunction)    Fibromyalgia    Follicular lymphoma, unspecified body region, 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   followed by [facility] oncology-- dr rocky slain;   hx enlarged lymph nodes since 2013;  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, treatment active survillance every 6 months   History of chest pain    neg adenosine myoview in 2008   History of pulmonary embolus (PE) 11/2011   bilateral lower lobes and right upper lobe,  provoked long car travel,  completed 6 months xarelto   (12-06-2021  pt stated never had clots before or since 2013   History of skin cancer 03/04/2013   Middle Of Nose Atypical Trichilemmoma    Hyperlipidemia    Impingement of right shoulder    Primary hypogonadism in male    PTSD (post-traumatic stress disorder)    Past Surgical History:  Procedure Laterality Date   RHINOPLASTY     x2   last one 1970s   TONSILLECTOMY     child   TRANSURETHRAL RESECTION OF PROSTATE N/A 09/13/2021   Procedure: TRANSURETHRAL RESECTION OF THE PROSTATE (TURP);  Surgeon: Elisabeth Valli BIRCH, MD;  Location:  [facility];  Service: Urology;  Laterality: N/A;   TRANSURETHRAL RESECTION OF PROSTATE N/A 12/13/2021   Procedure: REPEAT TRANSURETHRAL RESECTION OF THE PROSTATE (TURP)/URETHRAL DILATION;  Surgeon: Elisabeth Valli BIRCH, MD;  Location: [facility];  Service: Urology;  Laterality: N/A;  45 MINS   Patient Active Problem List   Diagnosis Date Noted   Cerebral vascular disease 06/20/2022   BPH with obstruction/lower urinary tract symptoms 09/13/2021   Cervical spondylosis 11/02/2020   Bilateral shoulder pain 11/02/2020   Bilateral hip pain 07/31/2017   Fatigue 03/26/2017   Medicare annual wellness visit, subsequent 01/09/2017   DOE (dyspnea on exertion) 07/20/2015   History of pulmonary embolus (PE) 03/29/2015   Hyperlipidemia 01/08/2014   Coronary atherosclerosis of native coronary artery 01/05/2014   Abnormal ultrasound of carotid artery 08/15/2013   Polymyalgia 08/15/2013   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   Excess weight 08/04/2013   Diuresis excessive 08/04/2013   Blood pressure elevated without history of HTN 01/31/2013   Nocturia 11/19/2012   Fibromyalgia 11/19/2012   Low testosterone  05/29/2012   Acute pulmonary embolism (HCC) 11/30/2011   Long term use of drug 11/28/2010   Diverticulosis of colon 03/09/2010   Clinical depression 07/30/2008   Elevated prostate specific antigen (PSA)  07/30/2008   Neurosis, posttraumatic 07/30/2008    PCP: Alben Therisa MATSU, PA PCP - General  REFERRING PROVIDER: Lyell Geofm BRAVO, MD  REFERRING DIAG: pelvic pain  THERAPY DIAG:  Muscle weakness (generalized)  Abnormal posture  Other lack of coordination  Rationale for Evaluation and Treatment: Rehabilitation ONSET DATE: 2020  SUBJECTIVE:                                                                                                                                                                                           SUBJECTIVE STATEMENT: I have not had any  changes from last visit for erections.   From eval: Pain was going for a while, but came back 5 months ago.  Patient reports that he has been to 2 other pelvic PT in the past, last one 2 years ago. Did not help much.  None of the exercises did any good.  This is an issues that he has for 3-4 year- right groin pain. (Points to more in the middle) First therapist tried internal work.  Patient reports that he has constant pain.  Has had 2 TURP procedures, pain is 24/7.  Ruled out hernia a while back Used to take pain meds, stopped Urological issues  for 20 years When it gets real bad, he takes Aleve Has slow growing cancer in his abdomen- not treating it TURP made it worse- has to get up 2/ night  PAIN:  Are you having pain? Yes NPRS scale: 5-8/10 Pain location: Internal, Deep, Right, Anterior, and middle  Pain type: sharp Pain description: intermittent   Aggravating factors: nothing, just there 70% of time Relieving factors: nothing, moves around when it is bad  PRECAUTIONS: None  RED FLAGS: None   WEIGHT BEARING RESTRICTIONS: No  FALLS:  Has patient fallen in last 6 months? No  OCCUPATION: retired  ACTIVITY LEVEL : not very active- because of the pain partially- started to walk but groin muscles hurt.   PLOF: Independent  PATIENT GOALS:  reduced pain  PERTINENT HISTORY:  2 TURP procedure Sexual abuse: No  BOWEL MOVEMENT: no issues  URINATION: Pain with urination: No Fully empty bladder: No Stream: Strong normal Urgency: No Frequency: yes, 2x night - varies throughout the day Fluid Intake: water and milk Leakage: a little bit Pads: Yes: paper towels  INTERCOURSE: no issues  OBJECTIVE:  Note: Objective measures were completed at Evaluation unless otherwise noted.   PATIENT SURVEYS:   PFIQ-7: UIQ-7- 67  COGNITION: Overall cognitive status: Within functional limits for tasks assessed     SENSATION: Light touch: Appears intact   FUNCTIONAL  TESTS:  Squat: difficult  Single leg stance: decreased balance bilat   Curl-up test: 1/4 with DRA with doming- 3 fingers throughout   GAIT: Assistive device utilized: None Comments: antalgic  POSTURE: rounded shoulders, forward head, decreased lumbar lordosis, decreased thoracic kyphosis, and flexed trunk    LUMBARAROM/PROM:  A/PROM A/PROM  Eval (% available)  Flexion 75%  Extension 75  Right lateral flexion 75  Left lateral flexion 75  Right rotation 75  Left rotation 75   (Blank rows = not tested)  PALPATION:   General: gentle palpation - very tender suprapubic area - reproduced pain patient is coming with  Pelvic Alignment: even  Abdominal: large diastasis rectus                External Perineal Exam: within functional limitations                              Internal Pelvic Floor: unable to push therapist's finger out  Patient confirms identification and approves PT to assess internal pelvic floor and treatment Yes  PELVIC MMT:   MMT eval 12/17/23  Internal Anal Sphincter 4/5 4/5  External Anal Sphincter 4/5 4/5  Puborectalis  4/5  Diastasis Recti Yes- 3 fingers throughout with doming with any transfer   (Blank rows = not tested)        TONE: average   TODAY'S TREATMENT:                                                                                                                              DATE:  10/11/2023 EVAL  Manual: abdominal work around bladder- very irritable area- more medially and on right Exercises: half kneeling stretch bilateral, horizontal abduction abduction with theraband  with transverse abdominis breath  Therapeutic activities: Pt was educated on relevant anatomy, exam findings, home exercise program, plan of care, expectations of PT    10/23/23:  Neuro re-ed: Hooklying diaphragmatic breathing + pelvic floor lengthening with inhalation + shortening with exhalation 2x10  Supine hip flexor stretch with leg off side of bed/couch +  diaphragmatic breathing 2x31min  Manual therapy: Internal rectal examination + assessment of pelvic floor musculature  Internal cueing for lengthening of rectum during inhalation and shortening during exhalation  Self care:  Relative anatomy, connection between the diaphragm and pelvic floor, how straining affects suprapubic/abdominal discomfort due to pressure changes   11/29/23: Seated ball press + transverse abdominis contraction on exhale 2x10 (cueing to pull the pelvic floor and core up and in while blowing out) Standing hip flexor stretch with foot on 2nd step + diaphragmatic breathing 2x10  Standing hip extension with diaphragmatic breathing focusing on stretching the hip flexors 2x10  Manual to bilateral hip flexors/groin with cup and sustained pressure techniques to promote blood flow and decrease tension Pubic bone cupping + mobilization of joint to decrease pubic bone pain 2x20   12/11/23: Seated ball press + transverse abdominis contraction  on exhale 2x10 (cueing to pull the pelvic floor and core up and in while blowing out) Single knee to chest stretch + diaphragmatic breathing 2x66min  Lower trunk rotations + diaphragmatic breathing 2x34min  Supine butterfly stretch + diaphragmatic breathing 2x59min   Seated piriformis stretch + diaphragmatic breathing 2x39min  Manual to bilateral hip flexors/groin with cup and sustained pressure techniques to promote blood flow and decrease tension Pubic bone cupping + mobilization of joint to decrease pubic bone pain 2x20  Inguinal ligament mobilization to promote blood flow to decrease tension around this area  Blow as you go technique to decrease intraabdominal/pelvic pressure with transfers   12/17/23 Manual: Myofascial release: Fascial release around the ischiocavernosus and along the inferior portion of the symphysis pubic bone area Spinal mobilization: PA and rotational mobilization to L1-5 grade 3 Mobilization to SI joint grade 3   Internal pelvic floor techniques: No emotional/communication barriers or cognitive limitation. Patient is motivated to learn. Patient understands and agrees with treatment goals and plan. PT explains patient will be examined in standing, sitting, and lying down to see how their muscles and joints work. When they are ready, they will be asked to remove their underwear so PT can examine their perineum. The patient is also given the option of providing their own chaperone as one is not provided in our facility. The patient also has the right and is explained the right to defer or refuse any part of the evaluation or treatment including the internal exam. With the patient's consent, PT will use one gloved finger to gently assess the muscles of the pelvic floor, seeing how well it contracts and relaxes and if there is muscle symmetry. After, the patient will get dressed and PT and patient will discuss exam findings and plan of care. PT and patient discuss plan of care, schedule, attendance policy and HEP activities.  Therapist going through the rectum working on the anococcygeal ligament, levator ani, obturator internist, along the sides of the prostate, along the sides of the urethra while gently pulling on the shaft of the penis to elongate the muscles and fascia Exercises: Stretches/mobility: Prone press up 10 x with overpressure Prone hip internal rotation 12/24/23:  Reviewed patients goals Went over treatments to see if any have helped.  Discussed other treatment options that are not physical therapy based       PATIENT EDUCATION:  Education details: See above Person educated: Patient Education method: Explanation, Demonstration, Tactile cues, Verbal cues, and Handouts Education comprehension: verbalized understanding  HOME EXERCISE PROGRAM: Access Code: [email or web address] URL: [email or web address] Date: 12/11/2023 Prepared by: Celena Domino  Exercises - Standing Lumbar Extension  -  1 x daily - 7 x weekly - 2 sets - 10 reps - Seated Abdominal Press into [employer]  - 1 x daily - 7 x weekly - 2 sets - 10 reps - Hip Flexor Stretch on Step  - 1 x daily - 7 x weekly - 2 sets - 10 reps - Seated Piriformis Stretch  - 1 x daily - 7 x weekly - 2 sets - hold - Supported Teacher, Music with Pelvic Floor Relaxation  - 1 x daily - 7 x weekly - 2 sets - hold - Supine Single Knee to Chest Stretch  - 1 x daily - 7 x weekly - 2 sets - hold  ASSESSMENT:  CLINICAL IMPRESSION: Patient is a 78 y.o. M who was seen today for physical therapy treatment for lower abdominal/ pelvic pain. He has  had this pain for about 4-5 years and has seen 2 pelvic PTs without success. Patient is able to stop the flow of his urine. Patient pelvic floor strength is 4/5. PA mobilization of L4 and L5 reduces the pain. Patient has tightness of the SI joint and lumbar vertebrae. He is having issues with erectile dysfunction. He continues to have the pain. No progress for the pain or erectile dysfunction. Patient is to be discharged.   OBJECTIVE IMPAIRMENTS: decreased activity tolerance, decreased coordination, decreased endurance, decreased mobility, decreased ROM, decreased strength, increased fascial restrictions, increased muscle spasms, impaired flexibility, impaired tone, improper body mechanics, postural dysfunction, and pain.   ACTIVITY LIMITATIONS: standing, continence, toileting, and locomotion level  PARTICIPATION LIMITATIONS: community activity  PERSONAL FACTORS: Age, Behavior pattern, and Time since onset of injury/illness/exacerbation are also affecting patient's functional outcome.   REHAB POTENTIAL: Good  CLINICAL DECISION MAKING: Evolving/moderate complexity  EVALUATION COMPLEXITY: Moderate   GOALS: Goals reviewed with patient? Yes  SHORT TERM GOALS: Target date: 11/08/2023    Pt will be independent with HEP.   Baseline: Goal status: Met 12/24/23  2.  Patient will report  reduced pelvic pain to max 3/10 with suprapubic  palpation Baseline:  Goal status: Not met 12/24/23    LONG TERM GOALS: Target date: 01/03/2024    Pt will be independent with advanced HEP.   Baseline:  Goal status: not met 12/24/23  2.  Patient will be able to walk at least 1 mile without increased groin pain  Baseline:  Goal status: Not met 12/24/23  3.  Patient will report max 2/10 pelvic pain in a 24 hour period Baseline:  Goal status: Not met 12/23/13  4.  Patient will get up maximum 1 time/ night to urinate Baseline: 2 Goal status: Not met 12/24/23  5.  Patient will have improved bilateral lower extremity strength to 5/5  Baseline:  Goal status: Not met 12/24/23  6.  Patient will demonstrate improved abdominal strength to 4/5 at least with reduced diastasis doming to improve ease of transfers Baseline:  Goal status: Not met 12/24/23  PLAN:Discharge to HEP    Channing Pereyra, PT 12/24/23 12:59 PM    PHYSICAL THERAPY DISCHARGE SUMMARY  Visits from Start of Care: 6  Current functional level related to goals / functional outcomes: See above.    Remaining deficits: See above.    Education / Equipment: HEP   Patient agrees to discharge. Patient goals were not met. Patient is being discharged due to lack of progress. Thank you for the referral.   Channing Pereyra, PT 12/24/23 12:59 PM

## 2024-01-10 DIAGNOSIS — E119 Type 2 diabetes mellitus without complications: Secondary | ICD-10-CM | POA: Diagnosis not present

## 2024-01-23 DIAGNOSIS — F331 Major depressive disorder, recurrent, moderate: Secondary | ICD-10-CM | POA: Diagnosis not present

## 2024-01-23 DIAGNOSIS — J069 Acute upper respiratory infection, unspecified: Secondary | ICD-10-CM | POA: Diagnosis not present

## 2024-01-23 DIAGNOSIS — E119 Type 2 diabetes mellitus without complications: Secondary | ICD-10-CM | POA: Diagnosis not present

## 2024-01-23 DIAGNOSIS — F4310 Post-traumatic stress disorder, unspecified: Secondary | ICD-10-CM | POA: Diagnosis not present

## 2024-01-23 DIAGNOSIS — F411 Generalized anxiety disorder: Secondary | ICD-10-CM | POA: Diagnosis not present
